# Patient Record
Sex: Female | Born: 1985 | Race: Black or African American | Hispanic: No | Marital: Single | Smoking: Never smoker
Health system: Southern US, Community
[De-identification: ages and names within clinical notes are randomized; demographics above are authoritative.]

## PROBLEM LIST (undated history)

## (undated) DIAGNOSIS — I10 Essential (primary) hypertension: Secondary | ICD-10-CM

## (undated) DIAGNOSIS — I639 Cerebral infarction, unspecified: Secondary | ICD-10-CM

## (undated) HISTORY — DX: Cerebral infarction, unspecified: I63.9

## (undated) HISTORY — DX: Essential (primary) hypertension: I10

---
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
## 2003-01-24 ENCOUNTER — Encounter: Admission: RE | Admit: 2003-01-24 | Discharge: 2003-01-24 | Payer: Self-pay | Admitting: Family Medicine

## 2003-03-11 ENCOUNTER — Emergency Department (HOSPITAL_COMMUNITY): Admission: EM | Admit: 2003-03-11 | Discharge: 2003-03-12 | Payer: Self-pay | Admitting: Emergency Medicine

## 2003-03-25 ENCOUNTER — Emergency Department (HOSPITAL_COMMUNITY): Admission: EM | Admit: 2003-03-25 | Discharge: 2003-03-25 | Payer: Self-pay | Admitting: Emergency Medicine

## 2008-07-30 DIAGNOSIS — Z8673 Personal history of transient ischemic attack (TIA), and cerebral infarction without residual deficits: Secondary | ICD-10-CM | POA: Insufficient documentation

## 2008-09-23 DIAGNOSIS — D6859 Other primary thrombophilia: Secondary | ICD-10-CM | POA: Insufficient documentation

## 2008-10-22 DIAGNOSIS — N939 Abnormal uterine and vaginal bleeding, unspecified: Secondary | ICD-10-CM | POA: Insufficient documentation

## 2008-10-23 DIAGNOSIS — D62 Acute posthemorrhagic anemia: Secondary | ICD-10-CM | POA: Insufficient documentation

## 2009-01-29 DIAGNOSIS — D6859 Other primary thrombophilia: Secondary | ICD-10-CM | POA: Insufficient documentation

## 2009-01-29 DIAGNOSIS — G8191 Hemiplegia, unspecified affecting right dominant side: Secondary | ICD-10-CM | POA: Insufficient documentation

## 2009-03-11 DIAGNOSIS — N179 Acute kidney failure, unspecified: Secondary | ICD-10-CM | POA: Insufficient documentation

## 2009-03-12 DIAGNOSIS — E870 Hyperosmolality and hypernatremia: Secondary | ICD-10-CM | POA: Insufficient documentation

## 2009-03-13 DIAGNOSIS — E876 Hypokalemia: Secondary | ICD-10-CM | POA: Insufficient documentation

## 2009-03-28 DIAGNOSIS — D649 Anemia, unspecified: Secondary | ICD-10-CM | POA: Insufficient documentation

## 2009-04-06 DIAGNOSIS — G890 Central pain syndrome: Secondary | ICD-10-CM | POA: Insufficient documentation

## 2010-08-25 DIAGNOSIS — IMO0001 Reserved for inherently not codable concepts without codable children: Secondary | ICD-10-CM | POA: Insufficient documentation

## 2010-09-17 ENCOUNTER — Inpatient Hospital Stay: Payer: Self-pay | Admitting: Obstetrics and Gynecology

## 2010-09-23 ENCOUNTER — Emergency Department: Payer: Self-pay | Admitting: Emergency Medicine

## 2010-11-28 ENCOUNTER — Inpatient Hospital Stay: Payer: Self-pay | Admitting: Internal Medicine

## 2011-06-16 ENCOUNTER — Emergency Department: Payer: Self-pay | Admitting: Unknown Physician Specialty

## 2011-06-16 LAB — URINALYSIS, COMPLETE
Bilirubin,UR: NEGATIVE
Blood: NEGATIVE
Glucose,UR: NEGATIVE mg/dL (ref 0–75)
Ketone: NEGATIVE
Leukocyte Esterase: NEGATIVE
Nitrite: NEGATIVE
Ph: 6 (ref 4.5–8.0)
Protein: 30
RBC,UR: <1 /HPF (ref 0–5)
Specific Gravity: 1.006 (ref 1.003–1.030)
Squamous Epithelial: NONE SEEN
WBC UR: 1 /HPF (ref 0–5)

## 2011-06-16 LAB — COMPREHENSIVE METABOLIC PANEL
Alkaline Phosphatase: 165 U/L — ABNORMAL HIGH (ref 50–136)
Anion Gap: 8 (ref 7–16)
BUN: 22 mg/dL — ABNORMAL HIGH (ref 7–18)
Bilirubin,Total: 0.1 mg/dL — ABNORMAL LOW (ref 0.2–1.0)
Chloride: 111 mmol/L — ABNORMAL HIGH (ref 98–107)
Co2: 17 mmol/L — ABNORMAL LOW (ref 21–32)
Creatinine: 1.04 mg/dL (ref 0.60–1.30)
EGFR (African American): >60
Osmolality: 274 (ref 275–301)
Potassium: 4.5 mmol/L (ref 3.5–5.1)
SGOT(AST): 24 U/L (ref 15–37)
SGPT (ALT): 20 U/L
Sodium: 136 mmol/L (ref 136–145)

## 2011-06-16 LAB — CBC
HCT: 37.7 % (ref 35.0–47.0)
HGB: 11.6 g/dL — ABNORMAL LOW (ref 12.0–16.0)
MCH: 27.1 pg (ref 26.0–34.0)
MCHC: 30.8 g/dL — ABNORMAL LOW (ref 32.0–36.0)
MCV: 88 fL (ref 80–100)
Platelet: 253 10*3/uL (ref 150–440)
RBC: 4.27 10*6/uL (ref 3.80–5.20)

## 2011-06-16 LAB — LIPASE, BLOOD: Lipase: 114 U/L (ref 73–393)

## 2011-06-17 LAB — HCG, QUANTITATIVE, PREGNANCY: Beta Hcg, Quant.: <1 m[IU]/mL — ABNORMAL LOW

## 2011-10-07 ENCOUNTER — Emergency Department: Payer: Self-pay | Admitting: Unknown Physician Specialty

## 2011-10-07 LAB — URINALYSIS, COMPLETE
Bacteria: NONE SEEN
Bilirubin,UR: NEGATIVE
Glucose,UR: NEGATIVE mg/dL (ref 0–75)
Ketone: NEGATIVE
Leukocyte Esterase: NEGATIVE
Nitrite: NEGATIVE
Ph: 6 (ref 4.5–8.0)
Protein: 100
RBC,UR: 1731 /HPF (ref 0–5)
Specific Gravity: 1.009 (ref 1.003–1.030)
Squamous Epithelial: 2
WBC UR: 9 /HPF (ref 0–5)

## 2011-10-07 LAB — CBC
HCT: 42.9 % (ref 35.0–47.0)
HGB: 14.1 g/dL (ref 12.0–16.0)
MCH: 29.2 pg (ref 26.0–34.0)
MCHC: 33 g/dL (ref 32.0–36.0)
MCV: 88 fL (ref 80–100)
Platelet: 283 10*3/uL (ref 150–440)
RBC: 4.85 10*6/uL (ref 3.80–5.20)
RDW: 14.7 % — ABNORMAL HIGH (ref 11.5–14.5)
WBC: 7.6 10*3/uL (ref 3.6–11.0)

## 2011-10-07 LAB — BASIC METABOLIC PANEL
Anion Gap: 10 (ref 7–16)
BUN: 12 mg/dL (ref 7–18)
Calcium, Total: 8.6 mg/dL (ref 8.5–10.1)
Chloride: 110 mmol/L — ABNORMAL HIGH (ref 98–107)
Co2: 19 mmol/L — ABNORMAL LOW (ref 21–32)
Creatinine: 0.79 mg/dL (ref 0.60–1.30)
EGFR (African American): >60
EGFR (Non-African Amer.): >60
Glucose: 87 mg/dL (ref 65–99)
Osmolality: 277 (ref 275–301)
Potassium: 4.3 mmol/L (ref 3.5–5.1)
Sodium: 139 mmol/L (ref 136–145)

## 2011-10-07 LAB — PREGNANCY, URINE: Pregnancy Test, Urine: NEGATIVE m[IU]/mL

## 2011-11-10 ENCOUNTER — Emergency Department: Payer: Self-pay | Admitting: Emergency Medicine

## 2011-11-10 LAB — BASIC METABOLIC PANEL
Anion Gap: 9 (ref 7–16)
BUN: 12 mg/dL (ref 7–18)
Calcium, Total: 8.5 mg/dL (ref 8.5–10.1)
Co2: 23 mmol/L (ref 21–32)
Creatinine: 0.79 mg/dL (ref 0.60–1.30)
EGFR (African American): >60
EGFR (Non-African Amer.): >60
Glucose: 76 mg/dL (ref 65–99)
Osmolality: 283 (ref 275–301)
Potassium: 4.4 mmol/L (ref 3.5–5.1)
Sodium: 143 mmol/L (ref 136–145)

## 2011-11-10 LAB — URINALYSIS, COMPLETE
Bacteria: NONE SEEN
Bilirubin,UR: NEGATIVE
Blood: NEGATIVE
Glucose,UR: NEGATIVE mg/dL (ref 0–75)
Hyaline Cast: 1
Ketone: NEGATIVE
Leukocyte Esterase: NEGATIVE
Nitrite: NEGATIVE
Ph: 5 (ref 4.5–8.0)
RBC,UR: 1 /HPF (ref 0–5)
Specific Gravity: 1.01 (ref 1.003–1.030)
Squamous Epithelial: NONE SEEN
WBC UR: 1 /HPF (ref 0–5)

## 2011-11-10 LAB — CBC
HGB: 11.3 g/dL — ABNORMAL LOW (ref 12.0–16.0)
MCH: 29.6 pg (ref 26.0–34.0)
MCV: 89 fL (ref 80–100)
RBC: 3.81 10*6/uL (ref 3.80–5.20)
WBC: 6.9 10*3/uL (ref 3.6–11.0)

## 2012-08-22 DIAGNOSIS — L309 Dermatitis, unspecified: Secondary | ICD-10-CM | POA: Insufficient documentation

## 2013-06-20 ENCOUNTER — Emergency Department: Payer: Self-pay | Admitting: Emergency Medicine

## 2013-08-14 ENCOUNTER — Ambulatory Visit (INDEPENDENT_AMBULATORY_CARE_PROVIDER_SITE_OTHER): Payer: Medicaid Other | Admitting: Podiatry

## 2013-08-14 ENCOUNTER — Encounter: Payer: Self-pay | Admitting: Podiatry

## 2013-08-14 VITALS — BP 101/70 | HR 64 | Resp 12

## 2013-08-14 DIAGNOSIS — M79673 Pain in unspecified foot: Secondary | ICD-10-CM

## 2013-08-14 DIAGNOSIS — B351 Tinea unguium: Secondary | ICD-10-CM

## 2013-08-14 DIAGNOSIS — Q828 Other specified congenital malformations of skin: Secondary | ICD-10-CM

## 2013-08-14 DIAGNOSIS — M79609 Pain in unspecified limb: Secondary | ICD-10-CM

## 2013-08-14 NOTE — Progress Notes (Signed)
   Subjective:    Patient ID: Stacey JacksonLatasha R [name], female    DOB: [birth year], 28 y.o.   MRN: [id number]  HPI  N-THICK, DISCOLORATION, SORE L-B/L TOENAILS D-LONG TERM O-SLOWLY C-WORSE A-PRESSURE T-NONE    Review of Systems  Musculoskeletal: Positive for gait problem.  All other systems reviewed and are negative.      Objective:   Physical Exam  Black female in orientated x3  Vascular: DP and PT pulses 2/4 bilaterally Sensation to 10 g monofilament wire intact 5/5 bilaterally  Dermatological: The toenails are elongated, brittle, discolored 6-10 Nucleated plantar keratoses x3 left Keratoses right and left hallux  Musculoskeletal: Patient has unstable gait, steppage gait right (toe heel) Manual motor testing Plantar flexion 5/5 right and 5/5 left Dorsi flexion 0/5 right 5/5 left Inversion 0/5 right and 5/5 left Eversion 0/5 right and 5/5 left No deformities noted     Assessment & Plan:   Assessment: Gait disturbance right post CVA Onychomycoses symptomatic 6-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

## 2013-08-15 ENCOUNTER — Encounter: Payer: Self-pay | Admitting: Podiatry

## 2013-08-28 ENCOUNTER — Ambulatory Visit: Payer: Self-pay | Admitting: Podiatrist

## 2013-11-18 DIAGNOSIS — R32 Unspecified urinary incontinence: Secondary | ICD-10-CM | POA: Insufficient documentation

## 2013-11-25 ENCOUNTER — Ambulatory Visit (INDEPENDENT_AMBULATORY_CARE_PROVIDER_SITE_OTHER): Payer: Medicaid Other | Admitting: Podiatry

## 2013-11-25 ENCOUNTER — Encounter: Payer: Self-pay | Admitting: Podiatry

## 2013-11-25 ENCOUNTER — Ambulatory Visit: Payer: Medicaid Other | Admitting: Podiatry

## 2013-11-25 DIAGNOSIS — B351 Tinea unguium: Secondary | ICD-10-CM

## 2013-11-25 DIAGNOSIS — M79676 Pain in unspecified toe(s): Secondary | ICD-10-CM

## 2013-11-25 DIAGNOSIS — Q828 Other specified congenital malformations of skin: Secondary | ICD-10-CM

## 2013-11-26 NOTE — Progress Notes (Signed)
Patient ID: Stacey JacksonLatasha R [name], female   DOB: March 15, 1985, 28 y.o.   MRN: [id number]  Subjective: This patient presents complaining of painful toenails and nucleated keratoses. Patient has a history of stroke resulting in gait disturbance. Patient is sneezing and coughing continuously today. Her mother is present in the room.  Objective: The toenails are elongated, hypertrophic, brittle 6-10 Nucleated plantar keratoses 3 left Non-nucleated keratoses hallux bilaterally  Assessment: Symptomatic onychomycoses 6-10 Porokeratosis 3 Keratoses 2 Gait disturbance post CVA  Plan: Nails 10 and keratoses 5 debrided without a bleeding  Reappoint 3 months

## 2014-02-24 ENCOUNTER — Ambulatory Visit (INDEPENDENT_AMBULATORY_CARE_PROVIDER_SITE_OTHER): Payer: Medicaid Other | Admitting: Podiatry

## 2014-02-24 ENCOUNTER — Encounter: Payer: Self-pay | Admitting: Podiatry

## 2014-02-24 DIAGNOSIS — M79676 Pain in unspecified toe(s): Secondary | ICD-10-CM

## 2014-02-24 DIAGNOSIS — B351 Tinea unguium: Secondary | ICD-10-CM

## 2014-02-24 DIAGNOSIS — Q828 Other specified congenital malformations of skin: Secondary | ICD-10-CM

## 2014-02-25 NOTE — Progress Notes (Signed)
Patient ID: Stacey JacksonLatasha R Gassett, female   DOB: 1985/12/25, 29 y.o.   MRN: [id number]  Subjective: This patient presents with mother present room complaining of painful toenails and painful keratoses.  Objective: The toenails are hypertrophic, elongated, discolored, brittle and tender to palpation 6-10 Nucleated plantar keratoses 3 left 1 right  Assessment: Symptomatic onychomycoses 6-10 Porokeratosis 3 Gait disturbance associated post CVA  Plan: Nails 10 and keratoses 3 debrided without a bleeding  Because patient is complaining of increased pain between 12 weeks visits reduced to 10 weeks

## 2014-05-12 ENCOUNTER — Encounter: Payer: Self-pay | Admitting: Podiatry

## 2014-05-12 ENCOUNTER — Ambulatory Visit (INDEPENDENT_AMBULATORY_CARE_PROVIDER_SITE_OTHER): Payer: Medicaid Other | Admitting: Podiatry

## 2014-05-12 DIAGNOSIS — B351 Tinea unguium: Secondary | ICD-10-CM

## 2014-05-12 DIAGNOSIS — Q828 Other specified congenital malformations of skin: Secondary | ICD-10-CM | POA: Diagnosis not present

## 2014-05-12 DIAGNOSIS — M79676 Pain in unspecified toe(s): Secondary | ICD-10-CM | POA: Diagnosis not present

## 2014-05-13 NOTE — Progress Notes (Signed)
Patient ID: Stacey [name], female   DOB: [birth year], 29 y.o.   MRN: [id number]  Subjective: This patient presents complaining of painful toenails and keratoses. Her mother is present in the treatment room today  Objective: The toenails are elongated, hypertrophic, discolored, incurvated and tender to direct palpation 6-10 Nucleated plantar keratoses 3 left 1 right  Assessment: Symptomatic onychomycoses 6-10 Porokeratosis 3 Gait disturbance associated post CVA  Plan: Debrided nails 10 and keratoses 3 without any bleeding  Reappoint 10 weeks

## 2014-07-21 ENCOUNTER — Ambulatory Visit: Payer: Medicaid Other | Admitting: Podiatry

## 2014-07-22 ENCOUNTER — Ambulatory Visit (INDEPENDENT_AMBULATORY_CARE_PROVIDER_SITE_OTHER): Payer: Medicaid Other | Admitting: Podiatry

## 2014-07-22 ENCOUNTER — Encounter: Payer: Self-pay | Admitting: Podiatry

## 2014-07-22 DIAGNOSIS — M79676 Pain in unspecified toe(s): Secondary | ICD-10-CM | POA: Diagnosis not present

## 2014-07-22 DIAGNOSIS — B351 Tinea unguium: Secondary | ICD-10-CM

## 2014-07-22 DIAGNOSIS — Q828 Other specified congenital malformations of skin: Secondary | ICD-10-CM

## 2014-07-22 NOTE — Progress Notes (Signed)
Patient ID: Stacey JacksonLatasha R [name], female   DOB: 03-01-1985, 29 y.o.   MRN: [id number]  Subjective: This patient presents today complaining of painful toenails and keratoses and is requesting debridement. Patient is presently treatment room  Objective: The toenails are hypertrophic, elongated, discolored, incurvated and tender direct palpation 6-10 Keratoses hallux bilaterally and plantar second MPJ 3  Assessment: Gait disturbance post CVA Symptomatic onychomycoses 6-10 Porokeratosis 1  Plan: Debridement of toenails mechanically and electrically without a bleeding Debrided keratoses 3 without any bleeding  Reappoint 3 months

## 2014-10-09 DIAGNOSIS — N182 Chronic kidney disease, stage 2 (mild): Secondary | ICD-10-CM | POA: Insufficient documentation

## 2014-10-22 ENCOUNTER — Ambulatory Visit: Payer: Medicaid Other | Admitting: Podiatry

## 2014-10-24 DIAGNOSIS — R7303 Prediabetes: Secondary | ICD-10-CM | POA: Insufficient documentation

## 2014-12-23 DIAGNOSIS — B351 Tinea unguium: Secondary | ICD-10-CM

## 2014-12-23 DIAGNOSIS — M79676 Pain in unspecified toe(s): Secondary | ICD-10-CM

## 2014-12-23 DIAGNOSIS — Q828 Other specified congenital malformations of skin: Secondary | ICD-10-CM

## 2014-12-24 ENCOUNTER — Ambulatory Visit (INDEPENDENT_AMBULATORY_CARE_PROVIDER_SITE_OTHER): Payer: Medicaid Other | Admitting: Podiatry

## 2014-12-24 ENCOUNTER — Encounter: Payer: Self-pay | Admitting: Podiatry

## 2014-12-24 DIAGNOSIS — B351 Tinea unguium: Secondary | ICD-10-CM

## 2014-12-24 DIAGNOSIS — M79676 Pain in unspecified toe(s): Secondary | ICD-10-CM

## 2014-12-24 DIAGNOSIS — Q828 Other specified congenital malformations of skin: Secondary | ICD-10-CM

## 2014-12-24 NOTE — Progress Notes (Signed)
Patient ID: Stacey JacksonLatasha R [name], female   DOB: 12/05/1985, 29 y.o.   MRN: [id number]  Subjective: This patient presents for scheduled visit complaining of elongated and thickened and deformed toenails which are uncomfortable with walking and drainage shoe pressure requesting debridement. Also patient complaining of a painful callus on the plantar aspect left foot. Patient presents at approximately three-month intervals for skin a nail debridement  Objective: Orientated 3 patient presents with mother treatment room The toenails are elongated, hypertrophic, incurvated, deformed and tender direct palpation 6-10 Minimal keratoses hallux bilaterally Nucleated keratoses plantar subsecond MPJ left  Assessment: Symptomatic onychomycoses 6-10 Porokeratosis 1 Gait disturbance post CVA  Plan: Debridement toenails 6-10 mechanical and electrical without any bleeding Debrided porokeratosis 1 without any bleeding  Reappoint 3 months

## 2015-03-31 ENCOUNTER — Ambulatory Visit (INDEPENDENT_AMBULATORY_CARE_PROVIDER_SITE_OTHER): Payer: Medicaid Other | Admitting: Podiatry

## 2015-03-31 ENCOUNTER — Encounter: Payer: Self-pay | Admitting: Podiatry

## 2015-03-31 DIAGNOSIS — B351 Tinea unguium: Secondary | ICD-10-CM | POA: Diagnosis not present

## 2015-03-31 DIAGNOSIS — Q828 Other specified congenital malformations of skin: Secondary | ICD-10-CM

## 2015-03-31 DIAGNOSIS — M79676 Pain in unspecified toe(s): Secondary | ICD-10-CM

## 2015-03-31 NOTE — Progress Notes (Signed)
Patient ID: Stacey JacksonLatasha R [name], female   DOB: Jan 20, 1985, 30 y.o.   MRN: [id number]   Subjective: This patient presents for scheduled visit complaining of elongated and thickened and deformed toenails which are uncomfortable with walking and drainage shoe pressure requesting debridement. Also patient complaining of a painful callus on the plantar aspect left foot. Patient presents at approximately three-month intervals for skin a nail debridement Patient's mother present in the treatment room today  Objective: Orientated 3 patient presents with mother treatment room The toenails are elongated, hypertrophic, incurvated, deformed and tender direct palpation 6-10 Minimal keratoses hallux bilaterally Nucleated keratoses plantar subsecond MPJ left  Assessment: Symptomatic onychomycoses 6-10 Porokeratosis 1 Gait disturbance post CVA  Plan: Debridement toenails 6-10 mechanical and electrically without any bleeding Debrided porokeratosis 1 without a bleeding Please note patient was extremely anxious and withdrawing right lower extremity making debridement more difficult today

## 2015-06-30 ENCOUNTER — Encounter: Payer: Self-pay | Admitting: Podiatry

## 2015-06-30 ENCOUNTER — Ambulatory Visit (INDEPENDENT_AMBULATORY_CARE_PROVIDER_SITE_OTHER): Payer: Medicaid Other | Admitting: Podiatry

## 2015-06-30 DIAGNOSIS — Q828 Other specified congenital malformations of skin: Secondary | ICD-10-CM | POA: Diagnosis not present

## 2015-06-30 DIAGNOSIS — M79676 Pain in unspecified toe(s): Secondary | ICD-10-CM

## 2015-06-30 DIAGNOSIS — B351 Tinea unguium: Secondary | ICD-10-CM

## 2015-06-30 NOTE — Progress Notes (Signed)
Patient ID: Stacey JacksonLatasha R [name], female   DOB: 11/12/1985, [age] y.o.   MRN: [id number]   Subjective: This patient presents for scheduled visit complaining of elongated and thickened and deformed toenails which are uncomfortable with walking and drainage shoe pressure requesting debridement. Also patient complaining of a painful callus on the plantar aspect left foot. Patient presents at approximately three-month intervals for skin a nail debridement Patient's mother present in the treatment room today  Objective: Orientated 3 patient presents with mother treatment room The toenails are elongated, hypertrophic, incurvated, deformed and tender direct palpation 6-10 Nucleated keratoses plantar subsecond MPJ left and hallux bilaterally  Assessment: Symptomatic onychomycoses 6-10 Porokeratosis 3 Gait disturbance post CVA  Plan: Debridement toenails 6-10 mechanical and electrically without any bleeding Debrided porokeratosis 3 without any  bleeding

## 2015-10-07 ENCOUNTER — Encounter: Payer: Self-pay | Admitting: Podiatry

## 2015-10-07 ENCOUNTER — Ambulatory Visit (INDEPENDENT_AMBULATORY_CARE_PROVIDER_SITE_OTHER): Payer: Medicaid Other | Admitting: Podiatry

## 2015-10-07 VITALS — BP 106/59 | HR 89 | Resp 18

## 2015-10-07 DIAGNOSIS — B351 Tinea unguium: Secondary | ICD-10-CM | POA: Diagnosis not present

## 2015-10-07 DIAGNOSIS — Q828 Other specified congenital malformations of skin: Secondary | ICD-10-CM | POA: Diagnosis not present

## 2015-10-07 DIAGNOSIS — M79676 Pain in unspecified toe(s): Secondary | ICD-10-CM | POA: Diagnosis not present

## 2015-10-08 NOTE — Progress Notes (Signed)
Patient ID: Stacey JacksonLatasha R [name], female   DOB: 1985/09/29, 30 y.o.   MRN: [id number]    Subjective: This patient presents for scheduled visit complaining of elongated and thickened and deformed toenails which are uncomfortable with walking and drainage shoe pressure requesting debridement. Also patient complaining of a painful callus on the plantar aspect left foot. Patient presents at approximately three-month intervals for skin a nail debridement Patient's mother present in the treatment room today  Objective: Orientated 3 patient presents with mother treatment room The toenails are elongated, hypertrophic, incurvated, deformed and tender direct palpation 6-10 Nucleated keratoses plantar subsecond MPJ left and hallux bilaterally  Assessment: Symptomatic onychomycoses 6-10 Porokeratosis 3 Gait disturbance post CVA  Plan: Debridement toenails 6-10 mechanical and electrically without any bleeding Debrided porokeratosis 3 without any  bleeding  Reappoint 3 months

## 2016-01-06 ENCOUNTER — Ambulatory Visit (INDEPENDENT_AMBULATORY_CARE_PROVIDER_SITE_OTHER): Payer: Medicaid Other | Admitting: Podiatry

## 2016-01-06 ENCOUNTER — Encounter: Payer: Self-pay | Admitting: Podiatry

## 2016-01-06 VITALS — BP 126/83 | HR 55 | Resp 18

## 2016-01-06 DIAGNOSIS — B351 Tinea unguium: Secondary | ICD-10-CM

## 2016-01-06 DIAGNOSIS — M79676 Pain in unspecified toe(s): Secondary | ICD-10-CM

## 2016-01-06 DIAGNOSIS — Q828 Other specified congenital malformations of skin: Secondary | ICD-10-CM

## 2016-01-07 NOTE — Progress Notes (Signed)
Patient ID: Stacey JacksonLatasha R [name], female   DOB: 24-Aug-1985, 31 y.o.   MRN: [id number]    Subjective: This patient presents for scheduled visit complaining of elongated and thickened and deformed toenails which are uncomfortable with walking and drainage shoe pressure requesting debridement. Also patient complaining of a painful callus on the plantar aspect left foot. Patient presents at approximately three-month intervals for skin a nail debridement Patient's mother present in the treatment room today  Objective: Orientated 3 patient presents with mother treatment room DP and PT pulses 2/4 bilaterally Capillary reflex immediate bilaterally Sensation to 10 g monofilament wire intact 0/5 right and 5/5 left Vibratory sensation nonreactive right and reactive left Dorsi flexion right 0/5 and 5/5 left Plantar flexion 4/5 right 5/5 left The toenails are elongated, hypertrophic, incurvated, deformed and tender direct palpation 6-10 Nucleated keratoses plantar subsecond MPJ left and hallux bilaterally  Assessment: Symptomatic onychomycoses 6-10 Porokeratosis 3 Gait disturbance post CVA  Plan: Debridement toenails 6-10 mechanical and electrically without any bleeding Debrided porokeratosis 3 without any bleeding  Reappoint 3 months

## 2016-04-06 ENCOUNTER — Ambulatory Visit: Payer: Medicaid Other | Admitting: Podiatry

## 2016-05-18 ENCOUNTER — Encounter: Payer: Self-pay | Admitting: Podiatry

## 2016-05-18 ENCOUNTER — Ambulatory Visit (INDEPENDENT_AMBULATORY_CARE_PROVIDER_SITE_OTHER): Payer: Medicaid Other | Admitting: Podiatry

## 2016-05-18 DIAGNOSIS — Q828 Other specified congenital malformations of skin: Secondary | ICD-10-CM

## 2016-05-18 DIAGNOSIS — M79676 Pain in unspecified toe(s): Secondary | ICD-10-CM | POA: Diagnosis not present

## 2016-05-18 DIAGNOSIS — B351 Tinea unguium: Secondary | ICD-10-CM | POA: Diagnosis not present

## 2016-05-18 NOTE — Progress Notes (Signed)
Patient ID: Stacey JacksonLatasha R [name], female   DOB: June 15, 1985, 31 y.o.   MRN: [id number]   Subjective: This patient presents for scheduled visit complaining of elongated and thickened and deformed toenails which are uncomfortable with walking and drainage shoe pressure requesting debridement. Also patient complaining of a painful callus on the plantar aspect left foot. Patient presents at approximately three-month intervals for skin a nail debridement Patient's mother present in the treatment room today  Objective: Orientated 3 patient presents with mother treatment room DP and PT pulses 2/4 bilaterally Capillary reflex immediate bilaterally Sensation to 10 g monofilament wire intact 0/5 right and 5/5 left Vibratory sensation nonreactive right and reactive left Dorsi flexion right 0/5 and 5/5 left Plantar flexion 4/5 right 5/5 left The toenails are elongated, hypertrophic, incurvated, deformed and tender direct palpation 6-10 Nucleated keratoses plantar subsecond MPJ left and hallux bilaterally  Assessment: Symptomatic onychomycoses 6-10 Porokeratosis 3 Gait disturbance post CVA  Plan: Debridement toenails 6-10 mechanical and electrically without any bleeding Debrided porokeratosis 3 without any bleeding  Reappoint 33 months

## 2016-08-31 ENCOUNTER — Ambulatory Visit: Payer: Medicaid Other | Admitting: Physical Therapy

## 2016-09-12 ENCOUNTER — Ambulatory Visit: Payer: Medicaid Other | Attending: Family Medicine | Admitting: Physical Therapy

## 2016-09-12 ENCOUNTER — Encounter: Payer: Self-pay | Admitting: Physical Therapy

## 2016-09-12 DIAGNOSIS — M25611 Stiffness of right shoulder, not elsewhere classified: Secondary | ICD-10-CM

## 2016-09-12 DIAGNOSIS — M25511 Pain in right shoulder: Secondary | ICD-10-CM

## 2016-09-12 NOTE — Therapy (Signed)
[facility] [address] [place], [place], [address] Phone: [phone]   Fax:  [phone]  Physical Therapy Evaluation  Patient Details  Name: Stacey [name] MRN: [id number] Date of Birth: 03/24/1985 Referring Provider: Fabienne Bruns  Encounter Date: 09/12/2016      PT End of Session - 09/12/16 1755    Visit Number 1   Authorization Type Medicaid which only allows one visit per year   PT Start Time 1650   PT Stop Time 1745   PT Time Calculation (min) 55 min   Activity Tolerance Patient tolerated treatment well;Patient limited by pain   Behavior During Therapy Anxious      Past Medical History:  Diagnosis Date  . Hypertension   . Stroke [facility])     History reviewed. No pertinent surgical history.  There were no vitals filed for this visit.       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  She c/o right shoulder pain.     Pertinent History History of stroke with right hemiparesis   Patient Stated Goals have less pain, less stiffness   Currently in Pain? Yes   Pain Score 0-No pain   Pain Location Shoulder   Pain Orientation Anterior;Right   Pain Descriptors / Indicators Aching;Spasm;Tightness   Pain Type Chronic pain   Pain Onset More than a month ago   Pain Frequency Intermittent   Aggravating Factors  only with use, washing, dressing deodorant pain rated a 8-9/10   Pain Relieving Factors rest no pain   Effect of Pain on Daily Activities difficulty with ADL's            [facility] PT Assessment - 09/12/16 0001      Assessment   Medical Diagnosis right pectoralis strain, frozen right shoulder   Referring Provider Fabienne Bruns   Onset Date/Surgical Date 08/12/16   Hand Dominance Right   Prior Therapy after the stroke years ago     Precautions   Precautions Fall     Balance Screen   Has the patient fallen in the past 6 months Yes   How many times? 3   Has the patient had a decrease in activity level because of a fear of falling?  Yes   Is the patient reluctant to leave their home because of a fear of falling?  No     Home Environment   Additional Comments lives with mom, needs help with dressing and doing hair     Prior Function   Level of Independence Needs assistance with ADLs;Needs assistance with gait;Requires assistive device for independence;Needs assistance with transfers   Vocation On disability   Vocation Requirements Stroke 2012   Leisure goes to a day program 4 hours a day     Posture/Postural Control   Posture Comments fwd head, rounded shoulders     Tone   Assessment Location Right Upper Extremity     ROM / Strength   AROM / PROM / Strength PROM     PROM   Overall PROM Comments PROM of the right elbow was to 45 degrees from straight   PROM Assessment Site Shoulder   Right/Left Shoulder Right   Right Shoulder Flexion 75 Degrees   Right Shoulder ABduction 70 Degrees   Right Shoulder Internal Rotation  10 Degrees   Right Shoulder External Rotation 30 Degrees     Palpation   Palpation comment she is very sore and tight in the right upper trap, the pec and the biceps     RUE Tone   RUE Tone Hypertonic            Objective measurements completed on examination: See above findings.          [facility] Adult PT Treatment/Exercise - 09/12/16 0001      Ambulation/Gait   Gait Comments gait with SBQC in left hand, small steps, slow, shuffles, right arm drawn up     Exercises   Exercises Shoulder     Shoulder Exercises: ROM/Strengthening   UBE (Upper Arm Bike) level 2 x 3 minutes with right hand tied on to handle   Other ROM/Strengthening Exercises Nustep with right hand and foot tied onto plate and handle x 3 minutes                PT Education - 09/12/16 1755     Education provided Yes   Education Details HEP for AAROM of the right shoulder and elbow using the left arm   Person(s) Educated Patient;Parent(s)   Methods Explanation;Demonstration;Handout   Comprehension Verbalized understanding;Returned demonstration;Tactile cues required             PT Long Term Goals - 09/12/16 1804      PT LONG TERM GOAL #1   Title patient and or mom is independent with HEP/PROM   Time 4   Period Weeks   Status New                Plan - 09/12/16 1756    Clinical Impression Statement Patient iwth right shoulder pain and stiffness.  Had a stroke 6 years ago.  Has not had PT since that time and has not done any exercises, she is very spastic in the right UE, she is fearful of hurting and gaurded with any PROM.  HEr ROM is limited to 70 degrees flexion passively, her right elbow extension is limited to 45 degrees from straight.  She truly needs guidance and help with this, however she has Medicaid and they only allow one visit per year.   History and Personal Factors relevant to plan of care: Stroke with hemiparesis of the right UE and right LE, she has not had PT in 6 years, she has severely limited motion and c/o pain, she is fearful   Clinical Presentation Stable   Clinical Decision Making Moderate   Rehab Potential Poor   PT Frequency 1x / week   PT Duration 2 weeks   PT Treatment/Interventions Therapeutic exercise;Therapeutic activities;Manual techniques   PT Next Visit Plan I gave HEP for her to try today, she is to do this at home, I asked them to come back in another week or two, at that time I really feel like we need to teach the mom how to do PROM as if it is not addressed she could develop further issues not just pain but significant hygeine isssues that may lead to open sores   Consulted and Agree with Plan of Care Patient      Patient will benefit from skilled therapeutic intervention in order to improve the following deficits and  impairments:  Impaired flexibility, Pain, Decreased range of motion, Increased muscle spasms, Impaired tone, Increased fascial restricitons  Visit Diagnosis: Acute pain of right shoulder - Plan: PT plan of care cert/re-cert  Stiffness of  right shoulder, not elsewhere classified - Plan: PT plan of care cert/re-cert     Problem List There are no active problems to display for this patient.   Jearld Lesch., PT 09/12/2016, 6:07 PM  [facility] [address] [place], [place], [address] Phone: [phone]   Fax:  [phone]  Name: AYLINN RYDBERG MRN: [id number] Date of Birth: Mar 24, 1985

## 2016-09-26 ENCOUNTER — Ambulatory Visit (INDEPENDENT_AMBULATORY_CARE_PROVIDER_SITE_OTHER): Payer: Medicaid Other | Admitting: Podiatry

## 2016-09-26 ENCOUNTER — Encounter: Payer: Self-pay | Admitting: Podiatry

## 2016-09-26 VITALS — BP 121/83 | HR 90

## 2016-09-26 DIAGNOSIS — M79676 Pain in unspecified toe(s): Secondary | ICD-10-CM | POA: Diagnosis not present

## 2016-09-26 DIAGNOSIS — B351 Tinea unguium: Secondary | ICD-10-CM

## 2016-09-26 DIAGNOSIS — Q828 Other specified congenital malformations of skin: Secondary | ICD-10-CM

## 2016-09-26 NOTE — Progress Notes (Signed)
Patient ID: Stacey [name], female   DOB: March 01, 1985, 31 y.o.   MRN: [id number]    Subjective: This patient presents for scheduled visit complaining of elongated and thickened and deformed toenails which are uncomfortable with walking and drainage shoe pressure requesting debridement. Also patient complaining of a painful callus on the plantar aspect left foot. Patient presents at approximately three-month intervals for skin a nail debridement Patient's mother present in the treatment room today  Objective: Orientated 3 patient presents with mother treatment room DP and PT pulses 2/4 bilaterally Capillary reflex immediate bilaterally Sensation to 10 g monofilament wire intact 0/5 right and 5/5 left Vibratory sensation nonreactive right and reactive left Dorsi flexion right 0/5 and 5/5 left Plantar flexion 4/5 right 5/5 left The toenails are elongated, hypertrophic, incurvated, deformed and tender direct palpation 6-10 Nucleated keratoses plantar subsecond MPJ left and hallux bilaterally  Assessment: Symptomatic onychomycoses 6-10 Porokeratosis 3 Gait disturbance post CVA  Plan: Debridement toenails 6-10 mechanical and electrically without any bleeding Debrided porokeratosis 3 without any bleeding  Reappoint 3 months

## 2016-09-27 ENCOUNTER — Ambulatory Visit: Payer: Medicaid Other | Admitting: Physical Therapy

## 2016-12-19 ENCOUNTER — Ambulatory Visit: Payer: Medicaid Other | Admitting: Podiatry

## 2017-01-20 ENCOUNTER — Ambulatory Visit: Payer: Medicaid Other | Admitting: Podiatry

## 2017-01-20 ENCOUNTER — Encounter: Payer: Self-pay | Admitting: Podiatry

## 2017-01-20 DIAGNOSIS — B351 Tinea unguium: Secondary | ICD-10-CM | POA: Diagnosis not present

## 2017-01-20 DIAGNOSIS — D689 Coagulation defect, unspecified: Secondary | ICD-10-CM

## 2017-01-20 DIAGNOSIS — Q828 Other specified congenital malformations of skin: Secondary | ICD-10-CM

## 2017-01-20 DIAGNOSIS — M79676 Pain in unspecified toe(s): Secondary | ICD-10-CM | POA: Diagnosis not present

## 2017-01-24 NOTE — Progress Notes (Signed)
Subjective:   Patient ID: Stacey [name], female   DOB: 32 y.o.   MRN: [id number]   HPI Patient presents with nail disease 1-5 both feet that are incurvated get sore make it hard to wear shoe gear.  Also has lesions on the hallux bilateral that are painful   ROS      Objective:  Physical Exam  Patient is a long-term unhealthy patient with blood clotting disorder who is on blood thinner with nail disease that are thick incurvated and painful along with lesion formation     Assessment:  Chronic mycotic nail infection with pain 1-5 both feet with lesions on the hallux bilateral with blood clotting disorder     Plan:  Reviewed condition debrided nailbeds lesions with no iatrogenic bleeding and reappoint for routine care

## 2017-04-24 ENCOUNTER — Ambulatory Visit (INDEPENDENT_AMBULATORY_CARE_PROVIDER_SITE_OTHER): Payer: Medicaid Other | Admitting: Podiatry

## 2017-04-24 ENCOUNTER — Encounter: Payer: Self-pay | Admitting: Podiatry

## 2017-04-24 DIAGNOSIS — F32A Depression, unspecified: Secondary | ICD-10-CM | POA: Insufficient documentation

## 2017-04-24 DIAGNOSIS — I639 Cerebral infarction, unspecified: Secondary | ICD-10-CM | POA: Insufficient documentation

## 2017-04-24 DIAGNOSIS — Q828 Other specified congenital malformations of skin: Secondary | ICD-10-CM

## 2017-04-24 DIAGNOSIS — M79676 Pain in unspecified toe(s): Secondary | ICD-10-CM

## 2017-04-24 DIAGNOSIS — G43909 Migraine, unspecified, not intractable, without status migrainosus: Secondary | ICD-10-CM | POA: Insufficient documentation

## 2017-04-24 DIAGNOSIS — D689 Coagulation defect, unspecified: Secondary | ICD-10-CM

## 2017-04-24 DIAGNOSIS — I10 Essential (primary) hypertension: Secondary | ICD-10-CM | POA: Insufficient documentation

## 2017-04-24 DIAGNOSIS — F329 Major depressive disorder, single episode, unspecified: Secondary | ICD-10-CM | POA: Insufficient documentation

## 2017-04-24 DIAGNOSIS — B351 Tinea unguium: Secondary | ICD-10-CM | POA: Diagnosis not present

## 2017-04-24 DIAGNOSIS — A6000 Herpesviral infection of urogenital system, unspecified: Secondary | ICD-10-CM | POA: Insufficient documentation

## 2017-04-24 DIAGNOSIS — E785 Hyperlipidemia, unspecified: Secondary | ICD-10-CM | POA: Insufficient documentation

## 2017-04-26 NOTE — Progress Notes (Signed)
Subjective:   Patient ID: Stacey JacksonLatasha R [name], female   DOB: 32 y.o.   MRN: [id number]   HPI Patient presents with a history of being on blood thinner and has nail disease that she cannot cut and lesions on both feet that are very thick and she cannot take care of and are painful   ROS      Objective:  Physical Exam  Neurovascular status unchanged with thick yellow brittle nailbeds that are painful 1-5 both feet and lesions bilateral that are painful with blood thinner that makes her condition at high risk     Assessment:  Mycotic nail infection with pain and severe lesion formation bilateral with at risk condition     Plan:  Debrided nailbeds 1-5 both feet with no iatrogenic bleeding and debrided lesions bilateral with no iatrogenic bleeding and reappoint for routine care

## 2017-07-24 ENCOUNTER — Ambulatory Visit: Payer: Medicaid Other | Admitting: Podiatry

## 2017-07-24 ENCOUNTER — Encounter: Payer: Self-pay | Admitting: Podiatry

## 2017-07-24 DIAGNOSIS — B351 Tinea unguium: Secondary | ICD-10-CM | POA: Diagnosis not present

## 2017-07-24 DIAGNOSIS — M79675 Pain in left toe(s): Secondary | ICD-10-CM | POA: Diagnosis not present

## 2017-07-24 DIAGNOSIS — M79674 Pain in right toe(s): Secondary | ICD-10-CM | POA: Diagnosis not present

## 2017-07-24 DIAGNOSIS — Q828 Other specified congenital malformations of skin: Secondary | ICD-10-CM | POA: Diagnosis not present

## 2017-07-24 DIAGNOSIS — D689 Coagulation defect, unspecified: Secondary | ICD-10-CM

## 2017-07-26 NOTE — Progress Notes (Signed)
Subjective:   Patient ID: Lorrin JacksonLatasha R [place]hite, female   DOB: 32 y.o.   MRN: [id number]   HPI Patient presents with thick yellow brittle nailbeds of both feet that she cannot take care of and lesions on both feet underneath the first metatarsal that are painful and she cannot cut she is on a blood thinner which creates high risk environment   ROS      Objective:  Physical Exam  Neurovascular status unchanged with patient found to have thick yellow brittle nailbeds 1-5 both feet that are painful and is noted to have lesion sub-first metatarsal that are painful when palpated     Assessment:  Chronic symptomatic mycotic nailbeds 1-5 both feet that she cannot cut and are painful and lesion formation bilateral that she cannot take care of     Plan:  H&P reviewed daily inspections of feet and debrided nailbeds 1-5 both feet with no iatrogenic bleeding lesions bilateral with no iatrogenic bleeding noted

## 2017-10-23 ENCOUNTER — Encounter: Payer: Self-pay | Admitting: Podiatry

## 2017-10-23 ENCOUNTER — Ambulatory Visit: Payer: Medicaid Other | Admitting: Podiatry

## 2017-10-23 DIAGNOSIS — L84 Corns and callosities: Secondary | ICD-10-CM

## 2017-10-23 DIAGNOSIS — B351 Tinea unguium: Secondary | ICD-10-CM | POA: Diagnosis not present

## 2017-10-23 DIAGNOSIS — M79675 Pain in left toe(s): Secondary | ICD-10-CM

## 2017-10-23 DIAGNOSIS — E1142 Type 2 diabetes mellitus with diabetic polyneuropathy: Secondary | ICD-10-CM

## 2017-10-23 DIAGNOSIS — M79674 Pain in right toe(s): Secondary | ICD-10-CM

## 2017-10-31 NOTE — Progress Notes (Signed)
Subjective: Stacey [name] is a 32 y.o. y.o. female who presents today for diabetic preventative foot care.  She is seen for painful mycotic toenails b/l feet and   cc of  painful calluses b/l.  Her mother is her primary caretaker and voices no new pedal concerns on today's visit. [name] voices no new complaints in regards to her feet.  She has h/o stroke and is taking Metformin for diabetes and is on clopidogrel as a blood thinner.  Objective: Vascular Examination: Capillary refill time immediate x 10 digits Dorsalis pedis pulses present b/l Posterior tibial pulses present b/l No digital hair x 10 digits Skin temperature warm to cool b/l  Dermatological Examination: Skin is noted to be supple and dry b/l Toenails 1-5 b/l discolored, thick, dystrophic with subungual debris and pain with palpation to nailbeds due to thickness of nails. Hyperkeratotic lesions noted submet head 5 left, submet head 2 left foot and plantarmedial hallux IPJ b/l. All lesions with no flocculence, no erythema, no edema, no warmth.  Musculoskeletal: Muscle strength 5/5 to all LE muscle groups left Muscle strength 3/5 to LE muscle groups right  Neurological: Sensation intact with 10 gram monofilament left foot; diminished right foot  Last A1C: 5.6 (10/10/2017) @[facility]  [place] On Plavix   Assessment: 1.  Painful onychomycosis toenails 1-5 b/l in patient on blood thinner.  2. Calluses x 4:  submet head 5 left, submet head 2 left foot and plantarmedial hallux IPJ b/l.  3. NIDDM with neuropathy  Plan: 1. Toenails 1-5 b/l were debrided in length and girth without iatrogenic bleeding. 2. Hyperkeratotic lesion debrided submet head 5 left, submet head 2 left foot and plantarmedial hallux IPJ b/l (total 4 lesions) utilizing sterile chisel blade. 3. Patient to continue soft, supportive shoe gear 4. Patient to report any pedal injuries to medical professional immediately. 5. Avoid self trimming due to use  of blood thinner. 6. Follow up 3 months. Patient/POA to call should there be a concern in the interim.

## 2017-11-02 ENCOUNTER — Ambulatory Visit: Payer: Medicaid Other | Attending: Family Medicine | Admitting: Physical Therapy

## 2017-11-02 DIAGNOSIS — M6281 Muscle weakness (generalized): Secondary | ICD-10-CM | POA: Diagnosis present

## 2017-11-02 DIAGNOSIS — R279 Unspecified lack of coordination: Secondary | ICD-10-CM | POA: Insufficient documentation

## 2017-11-02 NOTE — Patient Instructions (Signed)
About Abdominal Massage  Abdominal massage, also called external colon massage, is a self-treatment circular massage technique that can reduce and eliminate gas and ease constipation. The colon naturally contracts in waves in a clockwise direction starting from inside the right hip, moving up toward the ribs, across the belly, and down inside the left hip.  When you perform circular abdominal massage, you help stimulate your colon's normal wave pattern of movement called peristalsis.  It is most beneficial when done after eating.  Positioning You can practice abdominal massage with oil while lying down, or in the shower with soap.  Some people find that it is just as effective to do the massage through clothing while sitting or standing.  How to Massage Start by placing your finger tips or knuckles on your right side, just inside your hip bone.  . Make small circular movements while you move upward toward your rib cage.   . Once you reach the bottom right side of your rib cage, take your circular movements across to the left side of the bottom of your rib cage.  . Next, move downward until you reach the inside of your left hip bone.  This is the path your feces travel in your colon. . Continue to perform your abdominal massage in this pattern for 10 minutes each day.     You can apply as much pressure as is comfortable in your massage.  Start gently and build pressure as you continue to practice.  Notice any areas of pain as you massage; areas of slight pain may be relieved as you massage, but if you have areas of significant or intense pain, consult with your healthcare provider.  Other Considerations . General physical activity including bending and stretching can have a beneficial massage-like effect on the colon.  Deep breathing can also stimulate the colon because breathing deeply activates the same nervous system that supplies the colon.   . Abdominal massage should always be used in  combination with a bowel-conscious diet that is high in the proper type of fiber for you, fluids (primarily water), and a regular exercise program.  Types of Fiber  There are two main types of fiber:  insoluble and soluble.  Both of these types can prevent and relieve constipation and diarrhea, although some people find one or the other to be more easily digested.  This handout details information about both types of fiber.  Insoluble Fiber       Functions of Insoluble Fiber . moves bulk through the intestines  . controls and balances the pH (acidity) in the intestines       Benefits of Insoluble Fiber . promotes regular bowel movement and prevents constipation  . removes fecal waste through colon in less time  . keeps an optimal pH in intestines to prevent microbes from producing cancer substances, therefore preventing colon cancer        Food Sources of Insoluble Fiber . whole-wheat products  . wheat bran "miller's bran" . corn bran  . flax seed or other seeds . vegetables such as green beans, broccoli, cauliflower and potato skins  . fruit skins and root vegetable skins  . popcorn . brown rice  Soluble Fiber       Functions of Soluble Fiber  . holds water in the colon to bulk and soften the stool . prolongs stomach emptying time so that sugar is released and absorbed more slowly        Benefits of Soluble Fiber .   lowers total cholesterol and LDL cholesterol (the bad cholesterol) therefore reducing the risk of heart disease  . regulates blood sugar for people with diabetes       Food Sources of Soluble Fiber . oat/oat bran . dried beans and peas  . nuts  . barley  . flax seed or other seeds . fruits such as oranges, pears, peaches, and apples  . vegetables such as carrots  . psyllium husk  . prunes  

## 2017-11-03 NOTE — Therapy (Signed)
[facility] [address] [place], [place], [address] Phone: [phone]   Fax:  [phone]  Physical Therapy Evaluation  Patient Details  Name: Stacey [name] MRN: [id number] Date of Birth: 13-Jun-1985 Referring Provider (PT): Fabienne Bruns [place], [place]   Encounter Date: 11/02/2017  PT End of Session - 11/02/17 1742    Visit Number  1    Date for PT Re-Evaluation  01/25/18    Authorization Type  Medicaid     PT Start Time  1531    PT Stop Time  1623    PT Time Calculation (min)  52 min    Activity Tolerance  Patient tolerated treatment well;Patient limited by pain    Behavior During Therapy  Anxious;Flat affect   anxious during exam      Past Medical History:  Diagnosis Date  . Hypertension   . Stroke [facility])     No past surgical history on file.  There were no vitals filed for this visit.   Subjective Assessment - 11/02/17 1536    Subjective  Pt has not been able to go to day program due to fecal incontinence.  Now having BM about every 2-3 days and it is liquid    Patient is accompained by:  Family member   mom   Pertinent History  History of stroke with right hemiparesis    Patient Stated Goals  return to day program    Currently in Pain?  No/denies         [facility] PT Assessment - 11/03/17 0001      Assessment   Medical Diagnosis  N39.498 (ICD-10-CM) - Other specified urinary incontinence;R15.9 (ICD-10-CM) - Full incontinence of feces    Referring Provider (PT)  Cathie Hoops, [place]    Onset Date/Surgical Date  08/12/16    Hand Dominance  Right      Precautions   Precautions  Fall      Balance Screen   Has the patient fallen in the past 6 months  Yes    How many times?  4   slides out of the chair   Has the patient had a decrease in activity level because of a fear of falling?   Yes    Is the patient reluctant to leave their home because of a fear of falling?   No      Home  Public house manager residence    Living Arrangements  Parent    Additional Comments  lives with mom, needs help with dressing and doing hair      Prior Function   Level of Independence  Needs assistance with ADLs;Needs assistance with gait;Requires assistive device for independence;Needs assistance with transfers    Vocation  On disability    Vocation Requirements  Stroke 2012    Leisure  goes to a day program 4 hours a day;       Cognition   Overall Cognitive Status  Within Functional Limits for tasks assessed    Memory  Impaired   mom not sure, but appears forgetful     AROM   Overall AROM   Deficits    Overall AROM Comments  Rt upper and LE due to muscle spasms      Strength   Overall Strength Comments  Rt LE 4-/5 hip and knee; Rt ankle 2/5 MMT      Palpation   Palpation comment  severe tenderness and tight calf, hamstring, glutes  Ambulation/Gait   Assistive device  Small based quad cane    Gait Pattern  Within Functional Limits;Right hip hike;Right circumduction;Decreased dorsiflexion - right                Objective measurements completed on examination: See above findings.    Pelvic Floor Special Questions - 11/03/17 0001    Prior Pelvic/Prostate Exam  No    Prior Pregnancies  No    Urinary Leakage  Yes    How often  every time I have to go    Pad use  pull up x3/day    Activities that cause leaking  Coughing;Sneezing;With strong urge;Lifting    Urinary urgency  Yes    Fecal incontinence  Yes    Falling out feeling (prolapse)  No    Skin Integrity  Irritaion present at    Skin Integrity Irritation Present at  around anal opening - raw and tender    Prolapse  None    Pelvic Floor Internal Exam  pt identity confirmed and family member (mom) present, informed and consent given to perform internal soft tissue assessment    Exam Type  Rectal    Sensation  hypersensative    Palpation  weak low tone external sphincer, internal  sphincer and puborectalis high tone and very sensative    Strength  Flicker    Strength # of reps  1    Strength # of seconds  1    Tone  high               PT Education - 11/02/17 1741    Education provided  Yes    Education Details   Access Code: [email or web address] , abdominal massage, type of fiber    Person(s) Educated  Patient    Methods  Explanation;Demonstration;Handout;Verbal cues    Comprehension  Verbalized understanding;Returned demonstration          PT Long Term Goals - 11/02/17 1819      PT LONG TERM GOAL #1   Title  patient and or mom is independent with advanced HEP    Time  12    Period  Weeks    Status  New    Target Date  01/25/18      PT LONG TERM GOAL #2   Title  pt will be able to regular BM every day due to improved muscle coordination      Time  12    Period  Weeks    Status  New    Target Date  01/25/18      PT LONG TERM GOAL #3   Title  Pt will demonstrate ability to bulge pelvic floor muscle due to improved coordination     Time  12    Period  Weeks    Status  New    Target Date  01/25/17      PT LONG TERM GOAL #4   Title  Pt will demonstrate ability to contract pelvic floor muscle with at least 2/5 MMT due to improved strength    Time  12    Period  Weeks    Status  New    Target Date  01/25/17             Plan - 11/02/17 1743    Clinical Impression Statement  Pt presents to clinic with her mother who provided much of the history.  Pt appears to understand everything in eval and has normal cognitive function but seems to  be a poor historian about her falls and bowel leakage until her mother reminds her.  Pt has Rt side weakness mentioned above due to CVA in 2010 . Pt has high tone pelvic floor and irritated skin around the anus.  Pt did not tolerate internal exam past the puborectalis muscle due to pain.  Hard stool palpated just past the puborectalis.  Pt has flicker when MMT pelvic contraciton.  She has no bulge and lacks  coordination demonstrating valsalva maneuver when attempting to have BM.  Pt will benefit from skilled PT in order to address these impairments so she can return to the day program she was attending.    History and Personal Factors relevant to plan of care:  stroke with hemiparesis Rt UE/LE, chronic condition    Clinical Presentation  Evolving    Clinical Presentation due to:  pt has had worsening incontinence    Clinical Decision Making  Moderate    Rehab Potential  Excellent    PT Frequency  2x / week   or as able based on insurance   PT Duration  12 weeks    PT Treatment/Interventions  ADLs/Self Care Home Management;Biofeedback;Therapeutic activities;Therapeutic exercise;Neuromuscular re-education;Manual techniques;Passive range of motion;Dry needling;Taping    PT Next Visit Plan  abdominal massage, stretches, breathing techniques, biofeedback, review skin care    PT Home Exercise Plan   Access Code: [email or web address]     Recommended Other Services  AFO? eval 11/02/17    Consulted and Agree with Plan of Care  Patient       Patient will benefit from skilled therapeutic intervention in order to improve the following deficits and impairments:  Impaired flexibility, Pain, Decreased range of motion, Increased muscle spasms, Impaired tone, Increased fascial restricitons, Decreased strength  Visit Diagnosis: Unspecified lack of coordination - Plan: PT plan of care cert/re-cert  Muscle weakness (generalized) - Plan: PT plan of care cert/re-cert     Problem List Patient Active Problem List   Diagnosis Date Noted  . CVA (cerebrovascular accident) (HCC) 04/24/2017  . Depression 04/24/2017  . Genital herpes 04/24/2017  . Hyperlipidemia, unspecified 04/24/2017  . Hypertension 04/24/2017  . Migraine headache 04/24/2017  . Prediabetes 10/24/2014  . 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 Absence of bladder continence 11/18/2013  . Eczema 08/22/2012  . Contraception 08/25/2010     Vincente Poli, PT 11/03/2017, 11:02 AM  [facility] [address] [place], [place], [address] Phone: [phone]   Fax:  [phone]  Name: MUSKAAN SMET MRN: [id number] Date of Birth: 18-Feb-1985

## 2017-11-20 ENCOUNTER — Ambulatory Visit: Payer: Medicaid Other | Admitting: Physical Therapy

## 2017-11-21 ENCOUNTER — Ambulatory Visit: Payer: Medicaid Other | Attending: Family Medicine | Admitting: Physical Therapy

## 2017-11-21 DIAGNOSIS — R279 Unspecified lack of coordination: Secondary | ICD-10-CM | POA: Diagnosis not present

## 2017-11-21 DIAGNOSIS — M6281 Muscle weakness (generalized): Secondary | ICD-10-CM | POA: Insufficient documentation

## 2017-11-21 NOTE — Patient Instructions (Signed)
Access Code: [email or web address]  URL: [email or web address]  Date: 11/21/2017  Prepared by: Dorie RankJacqueline Crosser   Exercises  Seated Hamstring Stretch - 3 reps - 1 sets - 30 sec hold - 1x daily - 7x weekly  Seated Piriformis Stretch with Trunk Bend - 3 reps - 1 sets - 30 sec hold - 1x daily - 7x weekly  Ball squeeze with Kegel - 10 reps - 1 sets - 2 sec hold, 5 sec rest hold - 4x daily - 7x weekly  Hooklying Small March - 10 reps - 1 sets - 2 sec, rest 5 seconds hold - 1x daily - 7x weekly

## 2017-11-21 NOTE — Therapy (Signed)
[facility] [address] [place], [place], [address] Phone: [phone]   Fax:  [phone]  Physical Therapy Treatment  Patient Details  Name: Stacey [name] MRN: [id number] Date of Birth: 06-10-1985 Referring Provider (PT): Fabienne Bruns [place], [place]   Encounter Date: 11/21/2017  PT End of Session - 11/21/17 0817    Visit Number  2    Date for PT Re-Evaluation  01/25/18    Authorization Type  Medicaid     PT Start Time  0807    PT Stop Time  0845    PT Time Calculation (min)  38 min    Activity Tolerance  Patient tolerated treatment well;Patient limited by pain    Behavior During Therapy  [facility] for tasks assessed/performed       Past Medical History:  Diagnosis Date  . Hypertension   . Stroke [facility])     No past surgical history on file.  There were no vitals filed for this visit.  Subjective Assessment - 11/21/17 0825    Subjective  Pt was not a very good historian reporting that she went to day program yesterday and then said yes when asked if she stayed at home.    Currently in Pain?  No/denies                       [facility] Adult PT Treatment/Exercise - 11/21/17 0001      Neuro Re-ed    Neuro Re-ed Details   diaphragmatic breathing in supine      Exercises   Exercises  Lumbar      Lumbar Exercises: Stretches   Active Hamstring Stretch  Right;Left;30 seconds;2 reps    Piriformis Stretch  Right;Left;2 reps;20 seconds      Lumbar Exercises: Supine   Glut Set Limitations  kegel in supine and sidelying    Bent Knee Raise  10 reps   with kegel   Other Supine Lumbar Exercises  ball squeeze with kegel - tactile cues externally - 2 sec holds             PT Education - 11/21/17 0850    Education provided  Yes    Education Details   Access Code: [email or web address]     Person(s) Educated  Patient    Methods  Explanation;Demonstration;Handout;Verbal cues;Tactile cues    Comprehension   Verbalized understanding;Returned demonstration          PT Long Term Goals - 11/02/17 1819      PT LONG TERM GOAL #1   Title  patient and or mom is independent with advanced HEP    Time  12    Period  Weeks    Status  New    Target Date  01/25/18      PT LONG TERM GOAL #2   Title  pt will be able to regular BM every day due to improved muscle coordination      Time  12    Period  Weeks    Status  New    Target Date  01/25/18      PT LONG TERM GOAL #3   Title  Pt will demonstrate ability to bulge pelvic floor muscle due to improved coordination     Time  12    Period  Weeks    Status  New    Target Date  01/25/17      PT LONG TERM GOAL #4   Title  Pt  will demonstrate ability to contract pelvic floor muscle with at least 2/5 MMT due to improved strength    Time  12    Period  Weeks    Status  New    Target Date  01/25/17            Plan - 11/21/17 0846    Clinical Impression Statement  Pt did well with tactile cues for feedback to pelvic floor.  She needed cues to slow down to give her muscles time to relax after each rep.  Pt is able to hold for 2 seconds and needed 5 seconds to return to resting state.  She was educated in and performed diaphragmatic breathing to assist in relaxing.    PT Treatment/Interventions  ADLs/Self Care Home Management;Biofeedback;Therapeutic activities;Therapeutic exercise;Neuromuscular re-education;Manual techniques;Passive range of motion;Dry needling;Taping    PT Next Visit Plan  progress strengthening as able, abdominal massage, stretches, breathing techniques, biofeedback, review skin care    PT Home Exercise Plan   Access Code: [email or web address]     Consulted and Agree with Plan of Care  Patient       Patient will benefit from skilled therapeutic intervention in order to improve the following deficits and impairments:  Impaired flexibility, Pain, Decreased range of motion, Increased muscle spasms, Impaired tone, Increased fascial  restricitons, Decreased strength  Visit Diagnosis: Unspecified lack of coordination  Muscle weakness (generalized)     Problem List Patient Active Problem List   Diagnosis Date Noted  . CVA (cerebrovascular accident) (HCC) 04/24/2017  . Depression 04/24/2017  . Genital herpes 04/24/2017  . Hyperlipidemia, unspecified 04/24/2017  . Hypertension 04/24/2017  . Migraine headache 04/24/2017  . Prediabetes 10/24/2014  . 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 Absence of bladder continence 11/18/2013  . Eczema 08/22/2012  . Contraception 08/25/2010    Vincente PoliJakki Crosser, PT 11/21/2017, 8:55 AM  [facility] [address] [place], [place], [address] Phone: [phone]   Fax:  [phone]  Name: [name] MRN: [id number] Date of Birth: May 02, 1985

## 2017-11-24 ENCOUNTER — Ambulatory Visit: Payer: Medicaid Other | Admitting: Physical Therapy

## 2017-11-24 DIAGNOSIS — R279 Unspecified lack of coordination: Secondary | ICD-10-CM

## 2017-11-24 DIAGNOSIS — M6281 Muscle weakness (generalized): Secondary | ICD-10-CM

## 2017-11-24 NOTE — Patient Instructions (Signed)
Toileting Techniques for Bowel Movements (Defecation) Using your belly (abdomen) and pelvic floor muscles to have a bowel movement is usually instinctive.  Sometimes people can have problems with these muscles and have to relearn proper defecation (emptying) techniques.  If you have weakness in your muscles, organs that are falling out, decreased sensation in your pelvis, or ignore your urge to go, you may find yourself straining to have a bowel movement.  You are straining if you are: . holding your breath or taking in a huge gulp of air and holding it  . keeping your lips and jaw tensed and closed tightly . turning red in the face because of excessive pushing or forcing . developing or worsening your  hemorrhoids . getting faint while pushing . not emptying completely and have to defecate many times a day  If you are straining, you are actually making it harder for yourself to have a bowel movement.  Many people find they are pulling up with the pelvic floor muscles and closing off instead of opening the anus. Due to lack pelvic floor relaxation and coordination the abdominal muscles, one has to work harder to push the feces out.  Many people have never been taught how to defecate efficiently and effectively.  Notice what happens to your body when you are having a bowel movement.  While you are sitting on the toilet pay attention to the following areas: . Jaw and mouth position . Angle of your hips   . Whether your feet touch the ground or not . Arm placement  . Spine position . Waist . Belly tension . Anus (opening of the anal canal)  An Evacuation/Defecation Plan   Here are the 4 basic points:  1. Lean forward enough for your elbows to rest on your knees 2. Support your feet on the floor or use a low stool if your feet don't touch the floor  3. Push out your belly as if you have swallowed a beach ball-you should feel a widening of your waist 4. Open and relax your pelvic floor muscles,  rather than tightening around the anus      The following conditions my require modifications to your toileting posture:  . If you have had surgery in the past that limits your back, hip, pelvic, knee or ankle flexibility . Constipation   Your healthcare practitioner may make the following additional suggestions and adjustments:  1) Sit on the toilet  a) Make sure your feet are supported. b) Notice your hip angle and spine position-most people find it effective to lean forward or raise their knees, which can help the muscles around the anus to relax  c) When you lean forward, place your forearms on your thighs for support  2) Relax suggestions a) Breath deeply in through your nose and out slowly through your mouth as if you are smelling the flowers and blowing out the candles. b) To become aware of how to relax your muscles, contracting and releasing muscles can be helpful.  Pull your pelvic floor muscles in tightly by using the image of holding back gas, or closing around the anus (visualize making a circle smaller) and lifting the anus up and in.  Then release the muscles and your anus should drop down and feel open. Repeat 5 times ending with the feeling of relaxation. c) Keep your pelvic floor muscles relaxed; let your belly bulge out. d) The digestive tract starts at the mouth and ends at the anal opening, so be   sure to relax both ends of the tube.  Place your tongue on the roof of your mouth with your teeth separated.  This helps relax your mouth and will help to relax the anus at the same time.  3) Empty (defecation) a) Keep your pelvic floor and sphincter relaxed, then bulge your anal muscles.  Make the anal opening wide.  b) Stick your belly out as if you have swallowed a beach ball. c) Make your belly wall hard using your belly muscles while continuing to breathe. Doing this makes it easier to open your anus. d) Breath out and give a grunt (or try using other sounds such as  ahhhh, shhhhh, ohhhh or grrrrrrr).  4) Finish a) As you finish your bowel movement, pull the pelvic floor muscles up and in.  This will leave your anus in the proper place rather than remaining pushed out and down. If you leave your anus pushed out and down, it will start to feel as though that is normal and give you incorrect signals about needing to have a bowel movement.   [facility] [address] [place], [place] [address]  

## 2017-11-24 NOTE — Therapy (Signed)
[facility] [address], [address] [place], [place], [address] Phone: [phone]   Fax:  [phone]  Physical Therapy Treatment  Patient Details  Name: Stacey JacksonLatasha R [name] MRN: [id number] Date of Birth: [birth year] Referring Provider (PT): Fabienne Brunswens, Leanne ArabiWhaley, [place]   Encounter Date: 11/24/2017  PT End of Session - 11/24/17 1101    Visit Number  3    Date for PT Re-Evaluation  01/25/18    Authorization Type  Medicaid     Authorization - Visit Number  2    Authorization - Number of Visits  3    PT Start Time  0934    PT Stop Time  1014    PT Time Calculation (min)  40 min    Activity Tolerance  Patient tolerated treatment well;Patient limited by pain    Behavior During Therapy  [facility] for tasks assessed/performed       Past Medical History:  Diagnosis Date  . Hypertension   . Stroke [facility](HCC)     No past surgical history on file.  There were no vitals filed for this visit.  Subjective Assessment - 11/24/17 0938    Subjective  Pt hasn't had any accidents at the program.  But having accidents on the way home sometimes.      Patient is accompained by:  Family member   mother   Patient Stated Goals  return to day program    Currently in Pain?  No/denies                       [facility] - 11/24/17 0001      Self-Care   Self-Care  Other Self-Care Comments    Other Self-Care Comments   educated patient with caregiver for how to do HEP; briefly reviewed toilet techniques      Lumbar Exercises: Stretches   Active Hamstring Stretch  Right;Left;30 seconds;2 reps    Piriformis Stretch  Right;Left;2 reps;20 seconds   figure 4 and knee to opposite chest     Lumbar Exercises: Supine   Bent Knee Raise  10 reps   with kegel   Other Supine Lumbar Exercises  ball squeeze with kegel - tactile cues externally - 2 sec holds      Manual Therapy   Manual Therapy  Myofascial release    Myofascial  Release  abdominal fascial release             PT Education - 11/24/17 1014    Education provided  Yes    Education Details   Access Code: [email or web address] ; toilet techniques          PT Long Term Goals - 11/24/17 1102      PT LONG TERM GOAL #1   Title  patient and or mom is independent with advanced HEP    Status  On-going      PT LONG TERM GOAL #2   Title  pt will be able to regular BM every day due to improved muscle coordination      Status  On-going      PT LONG TERM GOAL #3   Title  Pt will demonstrate ability to bulge pelvic floor muscle due to improved coordination     Status  On-going      PT LONG TERM GOAL #4   Title  Pt will demonstrate ability to contract pelvic floor muscle with at least 2/5 MMT due to improved strength  Status  On-going            Plan - 11/24/17 1234    Clinical Impression Statement  Pt did well performing exercises and stretches with patient's mom observing and participating.  Pt is doing better at maintaining control of BM during the day and has not had issue at day program but she is losing conrtol of BM at night.  Currently she is holding contraction for 2 seconds at the most. Pt will benefit from skilled PT to continue working on endurance of pelvic floor and lengthening of soft tissue adhesions.    PT Treatment/Interventions  ADLs/Self Care Home Management;Biofeedback;Therapeutic activities;Therapeutic exercise;Neuromuscular re-education;Manual techniques;Passive range of motion;Dry needling;Taping    PT Next Visit Plan  re- eval, biofeedback or tactile feedback to reassess strength and endurance    PT Home Exercise Plan   Access Code: [email or web address]     Consulted and Agree with Plan of Care  Patient       Patient will benefit from skilled therapeutic intervention in order to improve the following deficits and impairments:  Impaired flexibility, Pain, Decreased range of motion, Increased muscle spasms, Impaired tone, Increased fascial  restricitons, Decreased strength  Visit Diagnosis: Unspecified lack of coordination  Muscle weakness (generalized)     Problem List Patient Active Problem List   Diagnosis Date Noted  . CVA (cerebrovascular accident) (HCC) 04/24/2017  . Depression 04/24/2017  . Genital herpes 04/24/2017  . Hyperlipidemia, unspecified 04/24/2017  . Hypertension 04/24/2017  . Migraine headache 04/24/2017  . Prediabetes 10/24/2014  . 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 Absence of bladder continence 11/18/2013  . Eczema 08/22/2012  . Contraception 08/25/2010    Vincente Poli, PT 11/24/2017, 12:39 PM  [facility] [address] [place], [place], [address] Phone: [phone]   Fax:  [phone]  Name: Stacey [name] MRN: [id number] Date of Birth: 10-10-1985

## 2017-11-28 ENCOUNTER — Ambulatory Visit: Payer: Medicaid Other | Admitting: Physical Therapy

## 2017-11-28 DIAGNOSIS — M6281 Muscle weakness (generalized): Secondary | ICD-10-CM

## 2017-11-28 DIAGNOSIS — R279 Unspecified lack of coordination: Secondary | ICD-10-CM | POA: Diagnosis not present

## 2017-11-28 NOTE — Therapy (Addendum)
[facility] [address] [place], [place], [address] Phone: [phone]   Fax:  [phone]  Physical Therapy Treatment  Patient Details  Name: Stacey [name] MRN: [id number] Date of Birth: 1985-12-24 Referring Provider (PT): Lethea Killings [place], [place]   Encounter Date: 11/28/2017  PT End of Session - 11/28/17 1625    Visit Number  4    Date for PT Re-Evaluation  01/25/18    Authorization Type  Medicaid     Authorization - Visit Number  3    Authorization - Number of Visits  3    PT Start Time  1618    PT Stop Time  1700    PT Time Calculation (min)  42 min    Activity Tolerance  Patient tolerated treatment well    Behavior During Therapy  [facility] for tasks assessed/performed       Past Medical History:  Diagnosis Date  . Hypertension   . Stroke [facility])     No past surgical history on file.  There were no vitals filed for this visit.  Subjective Assessment - 11/28/17 1621    Subjective  Pt hasn't had any accidents at the program lately.  She had an accident in her sleep last night but nothing other than that.      Patient is accompained by:  Family member    Pertinent History  History of stroke with right hemiparesis    Patient Stated Goals  return to day program    Currently in Pain?  No/denies         [facility] PT Assessment - 11/28/17 0001      Assessment   Medical Diagnosis  N39.498 (ICD-10-CM) - Other specified urinary incontinence;R15.9 (ICD-10-CM) - Full incontinence of feces    Referring Provider (PT)  Midge Minium, [place]    Onset Date/Surgical Date  08/12/16      Strength   Overall Strength Comments  Rt LE 4-/5 hip and knee; Rt ankle 2/5 MMT      Flexibility   Soft Tissue Assessment /Muscle Length  yes    Hamstrings  35 degrees of left hip flexion ; 45 degrees of Rt hip flexion      Palpation   Palpation comment  muscle spasms and tenderness hip adductors and hamstrings; tenderness at  rectus abdominus distal attachment                Pelvic Floor Special Questions - 11/28/17 0001    Pad use  pull up x3/day    Fecal incontinence  Yes   2x/week; been able to hold it at day program   Skin Integrity  Irritaion present at    Skin Integrity Irritation Present at  left labia minora    Perineal Body/Introitus   Elevated    Pelvic Floor Internal Exam  pt identity confirmed and family member (mom) present, informed and consent given to perform internal soft tissue assessment    Exam Type  Vaginal    Sensation  hypersensative    Palpation  spasm and tender to palpation ischiocavernosis and periurethral fascia    Strength  Flicker    Strength # of seconds  1    Tone  high        [facility] Adult PT Treatment/Exercise - 11/28/17 0001      Neuro Re-ed    Neuro Re-ed Details   kegel and bulging pelvic floor with external tactile feedback      Lumbar  Exercises: Supine   Other Supine Lumbar Exercises  hip internal rotation stretch      Manual Therapy   Manual Therapy  Internal Pelvic Floor;Soft tissue mobilization    Manual therapy comments  pt identity confirmed and inofrmed consent was given to perform internal soft tissue assessemnt and treatment    Soft tissue mobilization  bilateral adductors    Internal Pelvic Floor  external to ischiocavernosis bilaterally; transverse peroneus bilaterally,                  PT Long Term Goals - 11/28/17 1626      PT LONG TERM GOAL #1   Title  patient and or mom is independent with advanced HEP    Baseline  still learning    Status  On-going      PT LONG TERM GOAL #2   Title  pt will be able to regular BM every day due to improved muscle coordination      Baseline  stool normal consistency, but some days no BM    Status  On-going      PT LONG TERM GOAL #3   Title  Pt will demonstrate ability to bulge pelvic floor muscle due to improved coordination     Baseline  able to bulge with correct breathing technique     Status  Achieved      PT LONG TERM GOAL #4   Title  Pt will demonstrate ability to contract pelvic floor muscle with at least 2/5 MMT due to improved strength    Baseline  1/5 MMT    Status  On-going      PT LONG TERM GOAL #5   Title  Pt will be able to demonstrate at least 60 degrees of Rt hip flexion with straight leg in supine for improved functional mobility to improve toileting technique.    Baseline  35 degrees    Time  4    Period  Weeks    Status  New    Target Date  12/26/17      Additional Long Term Goals   Additional Long Term Goals  Yes      PT LONG TERM GOAL #6   Title  Pt will be able to sustain pelvic floor contraction for 8 seconds in order to control BM    Baseline  1 sec    Time  4    Period  Weeks    Status  New    Target Date  12/26/17            Plan - 11/28/17 1744    Clinical Impression Statement  Pt has been able to make it through the day without bowel leakage.  Pt is still having leakage at the end of the day and night and is unable to know when she needs to use the bathroom.  She continues to have severe tenderness and muscle spasms as mentioned above. Pt has LE weakness.  She also has difficutly contracting pelvic floor without significant amount of co-contraction.  Pt is 1/5 MMT of pelvic floor strength with low endurnace.  She will benefit from continued skilled PT to ensure she is able to maximize function in order to continue going to her day program which she will be dismissed from if she has any fecal incontinence.    PT Treatment/Interventions  ADLs/Self Care Home Management;Biofeedback;Therapeutic activities;Therapeutic exercise;Neuromuscular re-education;Manual techniques;Passive range of motion;Dry needling;Taping    PT Next Visit Plan  STM to pelvic floor,  adductors, lumbar and LE; lumbar and thoracic ROM, abdominal fascial release    Consulted and Agree with Plan of Care  Patient;Family member/caregiver    Family Member Consulted   mother/caregiver       Patient will benefit from skilled therapeutic intervention in order to improve the following deficits and impairments:  Impaired flexibility, Pain, Decreased range of motion, Increased muscle spasms, Impaired tone, Increased fascial restricitons, Decreased strength  Visit Diagnosis: Unspecified lack of coordination  Muscle weakness (generalized)     Problem List Patient Active Problem List   Diagnosis Date Noted  . CVA (cerebrovascular accident) ([place]) 04/24/2017  . Depression 04/24/2017  . Genital herpes 04/24/2017  . Hyperlipidemia, unspecified 04/24/2017  . Hypertension 04/24/2017  . Migraine headache 04/24/2017  . Prediabetes 10/24/2014  . 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 Absence of bladder continence 11/18/2013  . Eczema 08/22/2012  . Contraception 08/25/2010    Zannie Cove, PT 11/28/2017, 6:36 PM  [facility] [address] [place], [place], [address] Phone: [phone]   Fax:  [phone]  Name: KARINE GARN MRN: [id number] Date of Birth: 03-Sep-1985  PHYSICAL THERAPY DISCHARGE SUMMARY  Visits from Start of Care: 4  Current functional level related to goals / functional outcomes: See above remaining   Remaining deficits: See above   Education / Equipment: HEP  Plan: Patient agrees to discharge.  Patient goals were not met. Patient is being discharged due to not returning since the last visit.  ?????     [employer], PT 01/04/18 10:33 AM

## 2018-01-23 ENCOUNTER — Encounter: Payer: Self-pay | Admitting: Podiatry

## 2018-01-23 ENCOUNTER — Ambulatory Visit (INDEPENDENT_AMBULATORY_CARE_PROVIDER_SITE_OTHER): Payer: Medicaid Other | Admitting: Podiatry

## 2018-01-23 DIAGNOSIS — B351 Tinea unguium: Secondary | ICD-10-CM

## 2018-01-23 DIAGNOSIS — E1142 Type 2 diabetes mellitus with diabetic polyneuropathy: Secondary | ICD-10-CM

## 2018-01-23 DIAGNOSIS — M79675 Pain in left toe(s): Secondary | ICD-10-CM

## 2018-01-23 DIAGNOSIS — M79674 Pain in right toe(s): Secondary | ICD-10-CM | POA: Diagnosis not present

## 2018-01-23 DIAGNOSIS — L84 Corns and callosities: Secondary | ICD-10-CM | POA: Diagnosis not present

## 2018-01-23 NOTE — Patient Instructions (Signed)
Onychomycosis/Fungal Toenails  WHAT IS IT? An infection that lies within the keratin of your nail plate that is caused by a fungus.  WHY ME? Fungal infections affect all ages, sexes, races, and creeds.  There may be many factors that predispose you to a fungal infection such as age, coexisting medical conditions such as diabetes, or an autoimmune disease; stress, medications, fatigue, genetics, etc.  Bottom line: fungus thrives in a warm, moist environment and your shoes offer such a location.  IS IT CONTAGIOUS? Theoretically, yes.  You do not want to share shoes, nail clippers or files with someone who has fungal toenails.  Walking around barefoot in the same room or sleeping in the same bed is unlikely to transfer the organism.  It is important to realize, however, that fungus can spread easily from one nail to the next on the same foot.  HOW DO WE TREAT THIS?  There are several ways to treat this condition.  Treatment may depend on many factors such as age, medications, pregnancy, liver and kidney conditions, etc.  It is best to ask your doctor which options are available to you.  1. No treatment.   Unlike many other medical concerns, you can live with this condition.  However for many people this can be a painful condition and may lead to ingrown toenails or a bacterial infection.  It is recommended that you keep the nails cut short to help reduce the amount of fungal nail. 2. Topical treatment.  These range from herbal remedies to prescription strength nail lacquers.  About 40-50% effective, topicals require twice daily application for approximately 9 to 12 months or until an entirely new nail has grown out.  The most effective topicals are medical grade medications available through physicians offices. 3. Oral antifungal medications.  With an 80-90% cure rate, the most common oral medication requires 3 to 4 months of therapy and stays in your system for a year as the new nail grows out.  Oral  antifungal medications do require blood work to make sure it is a safe drug for you.  A liver function panel will be performed prior to starting the medication and after the first month of treatment.  It is important to have the blood work performed to avoid any harmful side effects.  In general, this medication safe but blood work is required. 4. Laser Therapy.  This treatment is performed by applying a specialized laser to the affected nail plate.  This therapy is noninvasive, fast, and non-painful.  It is not covered by insurance and is therefore, out of pocket.  The results have been very good with a 80-95% cure rate.  The [facility] is the only practice in the area to offer this therapy. 5. Permanent Nail Avulsion.  Removing the entire nail so that a new nail will not grow back.  Corns and Calluses Corns are small areas of thickened skin that occur on the top, sides, or tip of a toe. They contain a cone-shaped core with a point that can press on a nerve below. This causes pain.  Calluses are areas of thickened skin that can occur anywhere on the body, including the hands, fingers, palms, soles of the feet, and heels. Calluses are usually larger than corns. What are the causes? Corns and calluses are caused by rubbing (friction) or pressure, such as from shoes that are too tight or do not fit properly. What increases the risk? Corns are more likely to develop in people   who have misshapen toes (toe deformities), such as hammer toes. Calluses can occur with friction to any area of the skin. They are more likely to develop in people who:  Work with their hands.  Wear shoes that fit poorly, are too tight, or are high-heeled.  Have toe deformities. What are the signs or symptoms? Symptoms of a corn or callus include:  A hard growth on the skin.  Pain or tenderness under the skin.  Redness and swelling.  Increased discomfort while wearing tight-fitting shoes, if your feet are  affected. If a corn or callus becomes infected, symptoms may include:  Redness and swelling that gets worse.  Pain.  Fluid, blood, or pus draining from the corn or callus. How is this diagnosed? Corns and calluses may be diagnosed based on your symptoms, your medical history, and a physical exam. How is this treated? Treatment for corns and calluses may include:  Removing the cause of the friction or pressure. This may involve: ? Changing your shoes. ? Wearing shoe inserts (orthotics) or other protective layers in your shoes, such as a corn pad. ? Wearing gloves.  Applying medicine to the skin (topical medicine) to help soften skin in the hardened, thickened areas.  Removing layers of dead skin with a file to reduce the size of the corn or callus.  Removing the corn or callus with a scalpel or laser.  Taking antibiotic medicines, if your corn or callus is infected.  Having surgery, if a toe deformity is the cause. Follow these instructions at home:   Take over-the-counter and prescription medicines only as told by your health care provider.  If you were prescribed an antibiotic, take it as told by your health care provider. Do not stop taking it even if your condition starts to improve.  Wear shoes that fit well. Avoid wearing high-heeled shoes and shoes that are too tight or too loose.  Wear any padding, protective layers, gloves, or orthotics as told by your health care provider.  Soak your hands or feet and then use a file or pumice stone to soften your corn or callus. Do this as told by your health care provider.  Check your corn or callus every day for symptoms of infection. Contact a health care provider if you:  Notice that your symptoms do not improve with treatment.  Have redness or swelling that gets worse.  Notice that your corn or callus becomes painful.  Have fluid, blood, or pus coming from your corn or callus.  Have new symptoms. Summary  Corns are  small areas of thickened skin that occur on the top, sides, or tip of a toe.  Calluses are areas of thickened skin that can occur anywhere on the body, including the hands, fingers, palms, and soles of the feet. Calluses are usually larger than corns.  Corns and calluses are caused by rubbing (friction) or pressure, such as from shoes that are too tight or do not fit properly.  Treatment may include wearing any padding, protective layers, gloves, or orthotics as told by your health care provider. This information is not intended to replace advice given to you by your health care provider. Make sure you discuss any questions you have with your health care provider. Document Released: 09/26/2003 Document Revised: 11/02/2016 Document Reviewed: 11/02/2016 Elsevier Interactive Patient Education  2019 Elsevier Inc.  

## 2018-02-05 NOTE — Progress Notes (Signed)
Subjective: Stacey [name] presents with diabetes, diabetic neuropathy and cc of painful, discolored, thick toenails and painful calluses to both feet.   She is accompanied by her mother on today.  She nor her mother relate any new concerns on today's visit.  [place] is her primary care physician.   Current Outpatient Medications:  .  ARIPiprazole (ABILIFY) 2 MG tablet, Take 2 mg by mouth daily., Disp: , Rfl:  .  aspirin 325 MG tablet, Take 325 mg by mouth daily., Disp: , Rfl:  .  atorvastatin (LIPITOR) 40 MG tablet, Take 40 mg by mouth daily., Disp: , Rfl:  .  atorvastatin (LIPITOR) 80 MG tablet, , Disp: , Rfl: 3 .  baclofen (LIORESAL) 10 MG tablet, Take 10 mg by mouth 3 (three) times daily., Disp: , Rfl:  .  clopidogrel (PLAVIX) 75 MG tablet, Take 75 mg by mouth daily., Disp: , Rfl:  .  fluticasone (FLONASE) 50 MCG/ACT nasal spray, Place into the nose., Disp: , Rfl:  .  Incontinence Supply Disposable (UNDERPADS) MISC, 1 CASE + 5 PACKS PEACH!, Disp: , Rfl:  .  lisdexamfetamine (VYVANSE) 30 MG capsule, TK 1 C PO QAM, Disp: , Rfl:  .  medroxyPROGESTERone (DEPO-PROVERA) 150 MG/ML injection, Inject into the muscle., Disp: , Rfl:  .  metFORMIN (GLUCOPHAGE) 500 MG tablet, TK 1 T PO D WITH BRE, Disp: , Rfl: 2 .  metoprolol tartrate (LOPRESSOR) 25 MG tablet, Take 25 mg by mouth 2 (two) times daily., Disp: , Rfl:  .  ramipril (ALTACE) 5 MG capsule, Take 5 mg by mouth daily., Disp: , Rfl:  .  sertraline (ZOLOFT) 100 MG tablet, Take 100 mg by mouth daily., Disp: , Rfl:  .  valACYclovir (VALTREX) 500 MG tablet, 1 tab by mouth three times daily for 5 days as needed, Disp: , Rfl:   No Known Allergies  Vascular Examination: Capillary refill time immediate x 10 digits Dorsalis pedis and Posterior tibial pulses are both 2/4 bilaterally b/l No digital hair x 10 digits Skin temperature warm to cool b/l  Dermatological Examination: Skin with normal turgor, texture and tone  b/l  Toenails 1-5 b/l discolored, thick, dystrophic with subungual debris and pain with palpation to nailbeds due to thickness of nails.  Hyperkeratotic lesions noted submetatarsal head 5 left, submetatarsal head to left, and plantar medial hallux IPJ bilaterally.  There is no erythema, no edema, no drainage, no flocculence noted with either of the lesions.  No impending wounds.  Musculoskeletal: Muscle strength 5/5 to all LE muscle groups left lower extremity Muscle strength 3/5 to right lower extremity muscle groups   Neurological: Sensation diminished with 10 gram monofilament right foot; intact left foot  Assessment: 1. Painful onychomycosis toenails 1-5 b/l 2. Calluses submetatarsal head 5 left, submetatarsal head to left, and plantar medial hallux IPJ bilaterally 3. NIDDM with Diabetic neuropathy  Plan: 1. Continue diabetic foot care principles.  Literature dispensed on today 2. Toenails 1-5 b/l were debrided in length and girth without iatrogenic bleeding. 3. Hyperkeratotic lesion pared with sterile chisel blade  submetatarsal head 5 left, submetatarsal head to left, and plantar medial hallux IPJ bilaterally 4. Patient to continue soft, supportive shoe gear 5. Patient to report any pedal injuries to medical professional  6. Follow up 3 months. Patient/POA to call should there be a concern in the interim.

## 2018-04-25 ENCOUNTER — Encounter: Payer: Self-pay | Admitting: Podiatry

## 2018-04-25 ENCOUNTER — Ambulatory Visit: Payer: Medicaid Other | Admitting: Podiatry

## 2018-04-25 ENCOUNTER — Other Ambulatory Visit: Payer: Self-pay

## 2018-04-25 VITALS — Temp 97.7°F

## 2018-04-25 DIAGNOSIS — B351 Tinea unguium: Secondary | ICD-10-CM

## 2018-04-25 DIAGNOSIS — M79675 Pain in left toe(s): Secondary | ICD-10-CM

## 2018-04-25 DIAGNOSIS — L84 Corns and callosities: Secondary | ICD-10-CM

## 2018-04-25 DIAGNOSIS — M79674 Pain in right toe(s): Secondary | ICD-10-CM | POA: Diagnosis not present

## 2018-04-25 DIAGNOSIS — E1142 Type 2 diabetes mellitus with diabetic polyneuropathy: Secondary | ICD-10-CM

## 2018-04-25 NOTE — Patient Instructions (Signed)
Onychomycosis/Fungal Toenails  WHAT IS IT? An infection that lies within the keratin of your nail plate that is caused by a fungus.  WHY ME? Fungal infections affect all ages, sexes, races, and creeds.  There may be many factors that predispose you to a fungal infection such as age, coexisting medical conditions such as diabetes, or an autoimmune disease; stress, medications, fatigue, genetics, etc.  Bottom line: fungus thrives in a warm, moist environment and your shoes offer such a location.  IS IT CONTAGIOUS? Theoretically, yes.  You do not want to share shoes, nail clippers or files with someone who has fungal toenails.  Walking around barefoot in the same room or sleeping in the same bed is unlikely to transfer the organism.  It is important to realize, however, that fungus can spread easily from one nail to the next on the same foot.  HOW DO WE TREAT THIS?  There are several ways to treat this condition.  Treatment may depend on many factors such as age, medications, pregnancy, liver and kidney conditions, etc.  It is best to ask your doctor which options are available to you.  1. No treatment.   Unlike many other medical concerns, you can live with this condition.  However for many people this can be a painful condition and may lead to ingrown toenails or a bacterial infection.  It is recommended that you keep the nails cut short to help reduce the amount of fungal nail. 2. Topical treatment.  These range from herbal remedies to prescription strength nail lacquers.  About 40-50% effective, topicals require twice daily application for approximately 9 to 12 months or until an entirely new nail has grown out.  The most effective topicals are medical grade medications available through physicians offices. 3. Oral antifungal medications.  With an 80-90% cure rate, the most common oral medication requires 3 to 4 months of therapy and stays in your system for a year as the new nail grows out.  Oral  antifungal medications do require blood work to make sure it is a safe drug for you.  A liver function panel will be performed prior to starting the medication and after the first month of treatment.  It is important to have the blood work performed to avoid any harmful side effects.  In general, this medication safe but blood work is required. 4. Laser Therapy.  This treatment is performed by applying a specialized laser to the affected nail plate.  This therapy is noninvasive, fast, and non-painful.  It is not covered by insurance and is therefore, out of pocket.  The results have been very good with a 80-95% cure rate.  The [facility] is the only practice in the area to offer this therapy. 5. Permanent Nail Avulsion.  Removing the entire nail so that a new nail will not grow back.  Corns and Calluses Corns are small areas of thickened skin that occur on the top, sides, or tip of a toe. They contain a cone-shaped core with a point that can press on a nerve below. This causes pain.  Calluses are areas of thickened skin that can occur anywhere on the body, including the hands, fingers, palms, soles of the feet, and heels. Calluses are usually larger than corns. What are the causes? Corns and calluses are caused by rubbing (friction) or pressure, such as from shoes that are too tight or do not fit properly. What increases the risk? Corns are more likely to develop in people   who have misshapen toes (toe deformities), such as hammer toes. Calluses can occur with friction to any area of the skin. They are more likely to develop in people who:  Work with their hands.  Wear shoes that fit poorly, are too tight, or are high-heeled.  Have toe deformities. What are the signs or symptoms? Symptoms of a corn or callus include:  A hard growth on the skin.  Pain or tenderness under the skin.  Redness and swelling.  Increased discomfort while wearing tight-fitting shoes, if your feet are  affected. If a corn or callus becomes infected, symptoms may include:  Redness and swelling that gets worse.  Pain.  Fluid, blood, or pus draining from the corn or callus. How is this diagnosed? Corns and calluses may be diagnosed based on your symptoms, your medical history, and a physical exam. How is this treated? Treatment for corns and calluses may include:  Removing the cause of the friction or pressure. This may involve: ? Changing your shoes. ? Wearing shoe inserts (orthotics) or other protective layers in your shoes, such as a corn pad. ? Wearing gloves.  Applying medicine to the skin (topical medicine) to help soften skin in the hardened, thickened areas.  Removing layers of dead skin with a file to reduce the size of the corn or callus.  Removing the corn or callus with a scalpel or laser.  Taking antibiotic medicines, if your corn or callus is infected.  Having surgery, if a toe deformity is the cause. Follow these instructions at home:   Take over-the-counter and prescription medicines only as told by your health care provider.  If you were prescribed an antibiotic, take it as told by your health care provider. Do not stop taking it even if your condition starts to improve.  Wear shoes that fit well. Avoid wearing high-heeled shoes and shoes that are too tight or too loose.  Wear any padding, protective layers, gloves, or orthotics as told by your health care provider.  Soak your hands or feet and then use a file or pumice stone to soften your corn or callus. Do this as told by your health care provider.  Check your corn or callus every day for symptoms of infection. Contact a health care provider if you:  Notice that your symptoms do not improve with treatment.  Have redness or swelling that gets worse.  Notice that your corn or callus becomes painful.  Have fluid, blood, or pus coming from your corn or callus.  Have new symptoms. Summary  Corns are  small areas of thickened skin that occur on the top, sides, or tip of a toe.  Calluses are areas of thickened skin that can occur anywhere on the body, including the hands, fingers, palms, and soles of the feet. Calluses are usually larger than corns.  Corns and calluses are caused by rubbing (friction) or pressure, such as from shoes that are too tight or do not fit properly.  Treatment may include wearing any padding, protective layers, gloves, or orthotics as told by your health care provider. This information is not intended to replace advice given to you by your health care provider. Make sure you discuss any questions you have with your health care provider. Document Released: 09/26/2003 Document Revised: 11/02/2016 Document Reviewed: 11/02/2016 Elsevier Interactive Patient Education  2019 Elsevier Inc.  

## 2018-05-02 NOTE — Progress Notes (Signed)
Subjective: Stacey [name] presents with diabetic neuropathy and cc of painful, discolored, thick toenails b/l. She also has painful calluses b/l.  Pain is aggravated when wearing enclosed shoe gear. Pain is relieved with periodic professional debridement.  [place] is her PCP.    Current Outpatient Medications:  .  ARIPiprazole (ABILIFY) 2 MG tablet, Take 2 mg by mouth daily., Disp: , Rfl:  .  aspirin 325 MG tablet, Take 325 mg by mouth daily., Disp: , Rfl:  .  atorvastatin (LIPITOR) 40 MG tablet, Take 40 mg by mouth daily., Disp: , Rfl:  .  atorvastatin (LIPITOR) 80 MG tablet, , Disp: , Rfl: 3 .  baclofen (LIORESAL) 10 MG tablet, Take 10 mg by mouth 3 (three) times daily., Disp: , Rfl:  .  clopidogrel (PLAVIX) 75 MG tablet, Take 75 mg by mouth daily., Disp: , Rfl:  .  fluticasone (FLONASE) 50 MCG/ACT nasal spray, Place into the nose., Disp: , Rfl:  .  Incontinence Supply Disposable (UNDERPADS) MISC, 1 CASE + 5 PACKS PEACH!, Disp: , Rfl:  .  lisdexamfetamine (VYVANSE) 30 MG capsule, TK 1 C PO QAM, Disp: , Rfl:  .  medroxyPROGESTERone (DEPO-PROVERA) 150 MG/ML injection, Inject into the muscle., Disp: , Rfl:  .  metFORMIN (GLUCOPHAGE) 500 MG tablet, TK 1 T PO D WITH BRE, Disp: , Rfl: 2 .  metoprolol tartrate (LOPRESSOR) 25 MG tablet, Take 25 mg by mouth 2 (two) times daily., Disp: , Rfl:  .  ramipril (ALTACE) 5 MG capsule, Take 5 mg by mouth daily., Disp: , Rfl:  .  sertraline (ZOLOFT) 100 MG tablet, Take 100 mg by mouth daily., Disp: , Rfl:  .  valACYclovir (VALTREX) 500 MG tablet, 1 tab by mouth three times daily for 5 days as needed, Disp: , Rfl:   No Known Allergies  Vascular Examination: Capillary refill time immediate x 10 digits.  Dorsalis pedis pulses palpable b/l.  Posterior tibial pulses palpable b/l.  Dgital hair absent x 10 digits.  Skin temperature gradient warm to cool b/l.  Dermatological Examination: Skin with normal turgor, texture and  tone b/l.  Toenails 1-5 b/l discolored, thick, dystrophic with subungual debris and pain with palpation to nailbeds due to thickness of nails.  Hyperkeratotic lesions submet head 5 left, submet head 2 left, plantar hallux IPJ b/l. No erythema, no edema, no drainage, no flocculence noted.   Musculoskeletal: Muscle strength 5/5 to all LE muscle groups LLE; 3/5 RLE.  Neurological: Sensation diminished with 10 gram monofilament.  Assessment: 1. Painful onychomycosis toenails 1-5 b/l 2. Calluses submet head 5 left, submet head 2 left, plantar hallux IPJ b/l 3. NIDDM with Diabetic neuropathy  Plan: 1. Continue diabetic foot care principles. Literature dispensed on today. 2. Toenails 1-5 b/l were debrided in length and girth without iatrogenic bleeding. 3. Calluses pared submetatarsal head(s)  5 left, submet head 2 left, plantar hallux IPJ b/l utilizing sterile scalpel blade without incident. Corn(s) pared utilizing sterile scalpel blade without incident.  4. Patient to continue soft, supportive shoe gear 5. Patient to report any pedal injuries to medical professional  6. Follow up 3 months.  7. Patient/POA to call should there be a concern in the interim.

## 2018-05-03 ENCOUNTER — Encounter: Payer: Self-pay | Admitting: Podiatry

## 2018-07-25 ENCOUNTER — Ambulatory Visit: Payer: Medicaid Other | Admitting: Podiatry

## 2018-07-25 ENCOUNTER — Other Ambulatory Visit: Payer: Self-pay

## 2018-07-25 ENCOUNTER — Encounter: Payer: Self-pay | Admitting: Podiatry

## 2018-07-25 DIAGNOSIS — B351 Tinea unguium: Secondary | ICD-10-CM | POA: Diagnosis not present

## 2018-07-25 DIAGNOSIS — M79675 Pain in left toe(s): Secondary | ICD-10-CM | POA: Diagnosis not present

## 2018-07-25 DIAGNOSIS — M79674 Pain in right toe(s): Secondary | ICD-10-CM

## 2018-07-25 NOTE — Patient Instructions (Signed)
Corns and Calluses Corns are small areas of thickened skin that occur on the top, sides, or tip of a toe. They contain a cone-shaped core with a point that can press on a nerve below. This causes pain.  Calluses are areas of thickened skin that can occur anywhere on the body, including the hands, fingers, palms, soles of the feet, and heels. Calluses are usually larger than corns. What are the causes? Corns and calluses are caused by rubbing (friction) or pressure, such as from shoes that are too tight or do not fit properly. What increases the risk? Corns are more likely to develop in people who have misshapen toes (toe deformities), such as hammer toes. Calluses can occur with friction to any area of the skin. They are more likely to develop in people who:  Work with their hands.  Wear shoes that fit poorly, are too tight, or are high-heeled.  Have toe deformities. What are the signs or symptoms? Symptoms of a corn or callus include:  A hard growth on the skin.  Pain or tenderness under the skin.  Redness and swelling.  Increased discomfort while wearing tight-fitting shoes, if your feet are affected. If a corn or callus becomes infected, symptoms may include:  Redness and swelling that gets worse.  Pain.  Fluid, blood, or pus draining from the corn or callus. How is this diagnosed? Corns and calluses may be diagnosed based on your symptoms, your medical history, and a physical exam. How is this treated? Treatment for corns and calluses may include:  Removing the cause of the friction or pressure. This may involve: ? Changing your shoes. ? Wearing shoe inserts (orthotics) or other protective layers in your shoes, such as a corn pad. ? Wearing gloves.  Applying medicine to the skin (topical medicine) to help soften skin in the hardened, thickened areas.  Removing layers of dead skin with a file to reduce the size of the corn or callus.  Removing the corn or callus with a  scalpel or laser.  Taking antibiotic medicines, if your corn or callus is infected.  Having surgery, if a toe deformity is the cause. Follow these instructions at home:   Take over-the-counter and prescription medicines only as told by your health care provider.  If you were prescribed an antibiotic, take it as told by your health care provider. Do not stop taking it even if your condition starts to improve.  Wear shoes that fit well. Avoid wearing high-heeled shoes and shoes that are too tight or too loose.  Wear any padding, protective layers, gloves, or orthotics as told by your health care provider.  Soak your hands or feet and then use a file or pumice stone to soften your corn or callus. Do this as told by your health care provider.  Check your corn or callus every day for symptoms of infection. Contact a health care provider if you:  Notice that your symptoms do not improve with treatment.  Have redness or swelling that gets worse.  Notice that your corn or callus becomes painful.  Have fluid, blood, or pus coming from your corn or callus.  Have new symptoms. Summary  Corns are small areas of thickened skin that occur on the top, sides, or tip of a toe.  Calluses are areas of thickened skin that can occur anywhere on the body, including the hands, fingers, palms, and soles of the feet. Calluses are usually larger than corns.  Corns and calluses are caused by   rubbing (friction) or pressure, such as from shoes that are too tight or do not fit properly.  Treatment may include wearing any padding, protective layers, gloves, or orthotics as told by your health care provider. This information is not intended to replace advice given to you by your health care provider. Make sure you discuss any questions you have with your health care provider. Document Released: 09/26/2003 Document Revised: 04/11/2018 Document Reviewed: 11/02/2016 Elsevier Patient Education  2020 [employer]   Onychomycosis/Fungal Toenails  WHAT IS IT? An infection that lies within the keratin of your nail plate that is caused by a fungus.  WHY ME? Fungal infections affect all ages, sexes, races, and creeds.  There may be many factors that predispose you to a fungal infection such as age, coexisting medical conditions such as diabetes, or an autoimmune disease; stress, medications, fatigue, genetics, etc.  Bottom line: fungus thrives in a warm, moist environment and your shoes offer such a location.  IS IT CONTAGIOUS? Theoretically, yes.  You do not want to share shoes, nail clippers or files with someone who has fungal toenails.  Walking around barefoot in the same room or sleeping in the same bed is unlikely to transfer the organism.  It is important to realize, however, that fungus can spread easily from one nail to the next on the same foot.  HOW DO WE TREAT THIS?  There are several ways to treat this condition.  Treatment may depend on many factors such as age, medications, pregnancy, liver and kidney conditions, etc.  It is best to ask your doctor which options are available to you.  1. No treatment.   Unlike many other medical concerns, you can live with this condition.  However for many people this can be a painful condition and may lead to ingrown toenails or a bacterial infection.  It is recommended that you keep the nails cut short to help reduce the amount of fungal nail. 2. Topical treatment.  These range from herbal remedies to prescription strength nail lacquers.  About 40-50% effective, topicals require twice daily application for approximately 9 to 12 months or until an entirely new nail has grown out.  The most effective topicals are medical grade medications available through physicians offices. 3. Oral antifungal medications.  With an 80-90% cure rate, the most common oral medication requires 3 to 4 months of therapy and stays in your system for a year as the new nail grows out.   Oral antifungal medications do require blood work to make sure it is a safe drug for you.  A liver function panel will be performed prior to starting the medication and after the first month of treatment.  It is important to have the blood work performed to avoid any harmful side effects.  In general, this medication safe but blood work is required. 4. Laser Therapy.  This treatment is performed by applying a specialized laser to the affected nail plate.  This therapy is noninvasive, fast, and non-painful.  It is not covered by insurance and is therefore, out of pocket.  The results have been very good with a 80-95% cure rate.  [facility] is the only practice in the area to offer this therapy. 5. Permanent Nail Avulsion.  Removing the entire nail so that a new nail will not grow back. 

## 2018-07-29 NOTE — Progress Notes (Signed)
Subjective: Stacey [name] presents to clinic with cc of painful mycotic toenails and callues b/l feet which are aggravated when weightbearing with and without shoe gear.  This pain limits her daily activities. Pain symptoms resolve with periodic professional debridement.  She is accompanied by her mom on today's visit. She voices no new pedal concerns on today's visit.  [place] is her PCP.    Current Outpatient Medications:  .  ARIPiprazole (ABILIFY) 2 MG tablet, Take 2 mg by mouth daily., Disp: , Rfl:  .  aspirin 325 MG tablet, Take 325 mg by mouth daily., Disp: , Rfl:  .  atorvastatin (LIPITOR) 40 MG tablet, Take 40 mg by mouth daily., Disp: , Rfl:  .  atorvastatin (LIPITOR) 80 MG tablet, , Disp: , Rfl: 3 .  baclofen (LIORESAL) 10 MG tablet, Take 10 mg by mouth 3 (three) times daily., Disp: , Rfl:  .  benzonatate (TESSALON) 200 MG capsule, TAKE ONE CAPSULE BY MOUTH THREE TIMES A DAY FOR UP TO 7 DAYS, Disp: , Rfl:  .  clopidogrel (PLAVIX) 75 MG tablet, Take 75 mg by mouth daily., Disp: , Rfl:  .  fluticasone (FLONASE) 50 MCG/ACT nasal spray, Place into the nose., Disp: , Rfl:  .  Incontinence Supply Disposable (UNDERPADS) MISC, 1 CASE + 5 PACKS PEACH!, Disp: , Rfl:  .  lisdexamfetamine (VYVANSE) 30 MG capsule, TK 1 C PO QAM, Disp: , Rfl:  .  medroxyPROGESTERone (DEPO-PROVERA) 150 MG/ML injection, Inject into the muscle., Disp: , Rfl:  .  metFORMIN (GLUCOPHAGE) 500 MG tablet, TK 1 T PO D WITH BRE, Disp: , Rfl: 2 .  metoprolol tartrate (LOPRESSOR) 25 MG tablet, Take 25 mg by mouth 2 (two) times daily., Disp: , Rfl:  .  ramipril (ALTACE) 5 MG capsule, Take 5 mg by mouth daily., Disp: , Rfl:  .  sertraline (ZOLOFT) 100 MG tablet, Take 100 mg by mouth daily., Disp: , Rfl:  .  valACYclovir (VALTREX) 500 MG tablet, 1 tab by mouth three times daily for 5 days as needed, Disp: , Rfl:    Allergies  Allergen Reactions  . Metformin Diarrhea     Objective: There were  no vitals filed for this visit.  Physical Examination:  Vascular  Examination: Capillary refill time immediate x 10 digits.  Palpable DP/PT pulses b/l.  Digital hair absent b/l.  No edema noted b/l.  Skin temperature gradient WNL b/l.  Dermatological Examination: Skin with normal turgor, texture and tone b/l.  No open wounds b/l.  No interdigital macerations noted b/l.  Elongated, thick, discolored brittle toenails with subungual debris and pain on dorsal palpation of nailbeds 1-5 b/l.  Hyperkeratotic lesion submet head 5 left, submet head 2 left, plantar hallux IPJ b/l with tenderness to palpation. No edema, no erythema, no drainage, no flocculence.  Musculoskeletal Examination: Muscle strength 5/5 to all muscle groups LLE; 3/5 RLE.  No pain, crepitus or joint discomfort with active/passive ROM.  Neurological Examination: Sensation diminished with 10 gram monofilament.  Assessment: 1. Mycotic nail infection with pain 1-5 b/l 2. Calluses submet head 5 left, submet head 2 left, plantar hallux IPJ b/l  3.  Neuropathy  Plan: 1. Toenails 1-5 b/l were debrided in length and girth without iatrogenic laceration.. 2. Calluses pared submet head 5 left, submet head 2 left, plantar hallux IPJ b/l utilizing sterile scalpel blade without incident. Continue soft, supportive shoe gear daily. Report any pedal injuries to medical professional. Follow up 3 months. Patient/POA to  call should there be a question/concern in there interim.

## 2018-10-24 ENCOUNTER — Ambulatory Visit: Payer: Medicaid Other | Admitting: Podiatry

## 2018-10-26 ENCOUNTER — Encounter: Payer: Self-pay | Admitting: Podiatry

## 2018-10-26 ENCOUNTER — Other Ambulatory Visit: Payer: Self-pay

## 2018-10-26 ENCOUNTER — Ambulatory Visit (INDEPENDENT_AMBULATORY_CARE_PROVIDER_SITE_OTHER): Payer: Medicaid Other | Admitting: Podiatry

## 2018-10-26 DIAGNOSIS — L84 Corns and callosities: Secondary | ICD-10-CM

## 2018-10-26 DIAGNOSIS — M79675 Pain in left toe(s): Secondary | ICD-10-CM

## 2018-10-26 DIAGNOSIS — G629 Polyneuropathy, unspecified: Secondary | ICD-10-CM | POA: Diagnosis not present

## 2018-10-26 DIAGNOSIS — M79674 Pain in right toe(s): Secondary | ICD-10-CM | POA: Diagnosis not present

## 2018-10-26 DIAGNOSIS — B351 Tinea unguium: Secondary | ICD-10-CM | POA: Diagnosis not present

## 2018-10-26 DIAGNOSIS — Z9229 Personal history of other drug therapy: Secondary | ICD-10-CM

## 2018-10-28 NOTE — Progress Notes (Signed)
Subjective: Stacey [name] presents to clinic with cc of painful calluses b/l and painful mycotic toenails of both feet which are aggravated when weightbearing with and without shoe gear.  This pain limits her daily activities. Pain symptoms resolve with periodic professional debridement.  [place] is her PCP.   She is on blood thinner, Plavix.  Current Outpatient Medications on File Prior to Visit  Medication Sig Dispense Refill  . ARIPiprazole (ABILIFY) 2 MG tablet Take 2 mg by mouth daily.    Marland Kitchen aspirin 325 MG tablet Take 325 mg by mouth daily.    Marland Kitchen atorvastatin (LIPITOR) 40 MG tablet Take 40 mg by mouth daily.    Marland Kitchen atorvastatin (LIPITOR) 80 MG tablet   3  . baclofen (LIORESAL) 10 MG tablet Take 10 mg by mouth 3 (three) times daily.    . benzonatate (TESSALON) 200 MG capsule TAKE ONE CAPSULE BY MOUTH THREE TIMES A DAY FOR UP TO 7 DAYS    . clopidogrel (PLAVIX) 75 MG tablet Take 75 mg by mouth daily.    . fluticasone (FLONASE) 50 MCG/ACT nasal spray Place into the nose.    . Incontinence Supply Disposable (UNDERPADS) MISC 1 CASE + 5 PACKS PEACH!    . lisdexamfetamine (VYVANSE) 30 MG capsule TK 1 C PO QAM    . medroxyPROGESTERone (DEPO-PROVERA) 150 MG/ML injection Inject into the muscle.    . metFORMIN (GLUCOPHAGE) 500 MG tablet TK 1 T PO D WITH BRE  2  . metoprolol tartrate (LOPRESSOR) 25 MG tablet Take 25 mg by mouth 2 (two) times daily.    . ramipril (ALTACE) 5 MG capsule Take 5 mg by mouth daily.    . sertraline (ZOLOFT) 100 MG tablet Take 100 mg by mouth daily.    . valACYclovir (VALTREX) 500 MG tablet 1 tab by mouth three times daily for 5 days as needed     No current facility-administered medications on file prior to visit.      Allergies  Allergen Reactions  . Metformin Diarrhea    Objective: Physical Examination:  Vascular  Examination: Capillary refill time immediate x 10 digits.  Palpable DP/PT pulses b/l.  Digital hair absent b/l.  No  edema noted b/l.  Skin temperature gradient WNL b/l.  Dermatological Examination: Skin with normal turgor, texture and tone b/l.  No open wounds b/l.  No interdigital macerations noted b/l.  Elongated, thick, discolored brittle toenails with subungual debris and pain on dorsal palpation of nailbeds 1-5 b/l.  Hyperkeratotic lesion submet head 5 left foot, submet head 2 b/l and b/l hallux with tenderness to palpation. No edema, no erythema, no drainage, no flocculence.   Musculoskeletal Examination: Muscle strength 5/5 to all muscle groups b/l.  No pain, crepitus or joint discomfort with active/passive ROM.  Neurological Examination: Sensation diminished b/l with 10 gram monofilament.  Assessment: 1. Mycotic nail infection with pain 1-5 b/l 2. Calluses submet head 5 left foot, submet head 2 b/l and b/l hallux  Plan: 1. Toenails 1-5 b/l were debrided in length and girth without iatrogenic laceration. 2. Calluses pared submet head 5 left foot, submet head 2 b/l and b/l hallux utilizing sterile scalpel blade without incident. 3. Continue soft, supportive shoe gear daily. 4. Report any pedal injuries to medical professional. 5. Follow up 3 months. 6. Patient/POA to call should there be a question/concern in there interim.

## 2019-02-01 ENCOUNTER — Ambulatory Visit (INDEPENDENT_AMBULATORY_CARE_PROVIDER_SITE_OTHER): Payer: Medicaid Other | Admitting: Podiatry

## 2019-02-01 ENCOUNTER — Other Ambulatory Visit: Payer: Self-pay

## 2019-02-01 ENCOUNTER — Encounter: Payer: Self-pay | Admitting: Podiatry

## 2019-02-01 DIAGNOSIS — M79675 Pain in left toe(s): Secondary | ICD-10-CM

## 2019-02-01 DIAGNOSIS — L84 Corns and callosities: Secondary | ICD-10-CM

## 2019-02-01 DIAGNOSIS — M79674 Pain in right toe(s): Secondary | ICD-10-CM | POA: Diagnosis not present

## 2019-02-01 DIAGNOSIS — B351 Tinea unguium: Secondary | ICD-10-CM | POA: Diagnosis not present

## 2019-02-01 DIAGNOSIS — G629 Polyneuropathy, unspecified: Secondary | ICD-10-CM | POA: Diagnosis not present

## 2019-02-01 NOTE — Patient Instructions (Signed)

## 2019-02-02 NOTE — Progress Notes (Signed)
Subjective: STAMATIA MASRI presents today for follow up of callus(es) b/l and painful mycotic toenails b/l that are difficult to trim. Pain interferes with ambulation. Aggravating factors include wearing enclosed shoe gear. Pain is relieved with periodic professional debridement..   Allergies  Allergen Reactions  . Metformin Diarrhea     Objective: There were no vitals filed for this visit.  Vascular Examination:  Capillary refill time to digits immediate b/l, palpable DP pulses b/l, palpable PT pulses b/l, pedal hair absent b/l and skin temperature gradient within normal limits b/l  Dermatological Examination: Pedal skin with normal turgor, texture and tone bilaterally, no open wounds bilaterally, no interdigital macerations bilaterally, toenails 1-5 b/l elongated, dystrophic, thickened, crumbly with subungual debris and hyperkeratotic lesion(s) submet head 5 left foot, submet head 2 b/l, and b/l hallux.  No erythema, no edema, no drainage, no flocculence  Musculoskeletal: Normal muscle strength 5/5 to all lower extremity muscle groups bilaterally, no gross bony deformities bilaterally and no pain crepitus or joint limitation noted with ROM b/l  Neurological: Protective sensation absent with 10g monofilament b/l  Assessment: 1. Pain due to onychomycosis of toenails of both feet   2. Callus   3. Neuropathy      Plan: -Medicaid ABN signed for 2021 -Toenails 1-5 b/l were debrided in length and girth without iatrogenic bleeding. -corns and calluses were debrided without complication or incident. Total number debrided =5, submet head 5 left, submet head 2 b/l and b/l hallux -Patient to continue soft, supportive shoe gear daily. -Patient to report any pedal injuries to medical professional immediately. -Patient/POA to call should there be question/concern in the interim.  Return in about 3 months (around 05/02/2019) for nail and callus trim.

## 2019-05-06 ENCOUNTER — Other Ambulatory Visit: Payer: Self-pay

## 2019-05-06 ENCOUNTER — Encounter: Payer: Self-pay | Admitting: Podiatry

## 2019-05-06 ENCOUNTER — Ambulatory Visit (INDEPENDENT_AMBULATORY_CARE_PROVIDER_SITE_OTHER): Payer: Medicaid Other | Admitting: Podiatry

## 2019-05-06 VITALS — Temp 97.4°F

## 2019-05-06 DIAGNOSIS — M79674 Pain in right toe(s): Secondary | ICD-10-CM | POA: Diagnosis not present

## 2019-05-06 DIAGNOSIS — M216X2 Other acquired deformities of left foot: Secondary | ICD-10-CM | POA: Diagnosis not present

## 2019-05-06 DIAGNOSIS — M79675 Pain in left toe(s): Secondary | ICD-10-CM

## 2019-05-06 DIAGNOSIS — B351 Tinea unguium: Secondary | ICD-10-CM

## 2019-05-06 DIAGNOSIS — L84 Corns and callosities: Secondary | ICD-10-CM

## 2019-05-06 DIAGNOSIS — G629 Polyneuropathy, unspecified: Secondary | ICD-10-CM

## 2019-05-06 NOTE — Progress Notes (Signed)
Subjective: Stacey [name] presents today painful mycotic nails b/l that are difficult to trim. Pain interferes with ambulation. Aggravating factors include wearing enclosed shoe gear. Pain is relieved with periodic professional debridement.  Medications reviewed in chart.  Allergies  Allergen Reactions  . Metformin Diarrhea    Objective: Vitals:   05/06/19 1406  Temp: (!) 97.4 F (36.3 C)    Vascular Examination: Capillary refill time to digits immediate b/l. Palpable DP pulses b/l. Palpable PT pulses b/l. Pedal hair absent b/l Skin temperature gradient within normal limits b/l. Trace edema noted b/l feet.  Dermatological Examination: Pedal skin with normal turgor, texture and tone bilaterally. No open wounds bilaterally. No interdigital macerations bilaterally. Toenails 1-5 b/l elongated, dystrophic, thickened, crumbly with subungual debris and tenderness to dorsal palpation. Porokeratotic lesion(s) L hallux, R hallux, submet head 2 left foot, submet head 2 right foot and submet head 5 left foot. No erythema, no edema, no drainage, no flocculence.  Musculoskeletal: Normal muscle strength 5/5 to all lower extremity muscle groups bilaterally. No pain crepitus or joint limitation noted with ROM b/l. Plantarflexed ray left 2nd digit with tenderness to palpation. No erythema, no edema, no drainage.  Neurological Examination: Protective sensation diminished with 10g monofilament b/l. Vibratory sensation diminished b/l.  Assessment: 1. Pain due to onychomycosis of toenails of both feet   2. Plantarflexion deformity of left foot   3. Callus   4. Neuropathy    Plan: -Toenails 1-5 b/l were debrided in length and girth with sterile nail nippers and dremel without iatrogenic bleeding.  -For plantarflexion deformity, continue shoes with shock absorption daily. -Patient to continue soft, supportive shoe gear daily. -Patient to report any pedal injuries to medical professional  immediately. -Patient/POA to call should there be question/concern in the interim.  Return in about 3 months (around 08/06/2019) for nail and callus trim.

## 2019-05-06 NOTE — Patient Instructions (Signed)
Corns and Calluses Corns are small areas of thickened skin that occur on the top, sides, or tip of a toe. They contain a cone-shaped core with a point that can press on a nerve below. This causes pain.  Calluses are areas of thickened skin that can occur anywhere on the body, including the hands, fingers, palms, soles of the feet, and heels. Calluses are usually larger than corns. What are the causes? Corns and calluses are caused by rubbing (friction) or pressure, such as from shoes that are too tight or do not fit properly. What increases the risk? Corns are more likely to develop in people who have misshapen toes (toe deformities), such as hammer toes. Calluses can occur with friction to any area of the skin. They are more likely to develop in people who:  Work with their hands.  Wear shoes that fit poorly, are too tight, or are high-heeled.  Have toe deformities. What are the signs or symptoms? Symptoms of a corn or callus include:  A hard growth on the skin.  Pain or tenderness under the skin.  Redness and swelling.  Increased discomfort while wearing tight-fitting shoes, if your feet are affected. If a corn or callus becomes infected, symptoms may include:  Redness and swelling that gets worse.  Pain.  Fluid, blood, or pus draining from the corn or callus. How is this diagnosed? Corns and calluses may be diagnosed based on your symptoms, your medical history, and a physical exam. How is this treated? Treatment for corns and calluses may include:  Removing the cause of the friction or pressure. This may involve: ? Changing your shoes. ? Wearing shoe inserts (orthotics) or other protective layers in your shoes, such as a corn pad. ? Wearing gloves.  Applying medicine to the skin (topical medicine) to help soften skin in the hardened, thickened areas.  Removing layers of dead skin with a file to reduce the size of the corn or callus.  Removing the corn or callus with a  scalpel or laser.  Taking antibiotic medicines, if your corn or callus is infected.  Having surgery, if a toe deformity is the cause. Follow these instructions at home:   Take over-the-counter and prescription medicines only as told by your health care provider.  If you were prescribed an antibiotic, take it as told by your health care provider. Do not stop taking it even if your condition starts to improve.  Wear shoes that fit well. Avoid wearing high-heeled shoes and shoes that are too tight or too loose.  Wear any padding, protective layers, gloves, or orthotics as told by your health care provider.  Soak your hands or feet and then use a file or pumice stone to soften your corn or callus. Do this as told by your health care provider.  Check your corn or callus every day for symptoms of infection. Contact a health care provider if you:  Notice that your symptoms do not improve with treatment.  Have redness or swelling that gets worse.  Notice that your corn or callus becomes painful.  Have fluid, blood, or pus coming from your corn or callus.  Have new symptoms. Summary  Corns are small areas of thickened skin that occur on the top, sides, or tip of a toe.  Calluses are areas of thickened skin that can occur anywhere on the body, including the hands, fingers, palms, and soles of the feet. Calluses are usually larger than corns.  Corns and calluses are caused by   rubbing (friction) or pressure, such as from shoes that are too tight or do not fit properly.  Treatment may include wearing any padding, protective layers, gloves, or orthotics as told by your health care provider. This information is not intended to replace advice given to you by your health care provider. Make sure you discuss any questions you have with your health care provider. Document Revised: 04/11/2018 Document Reviewed: 11/02/2016 Elsevier Patient Education  2020 [employer]  Onychomycosis/Fungal  Toenails  WHAT IS IT? An infection that lies within the keratin of your nail plate that is caused by a fungus.  WHY ME? Fungal infections affect all ages, sexes, races, and creeds.  There may be many factors that predispose you to a fungal infection such as age, coexisting medical conditions such as diabetes, or an autoimmune disease; stress, medications, fatigue, genetics, etc.  Bottom line: fungus thrives in a warm, moist environment and your shoes offer such a location.  IS IT CONTAGIOUS? Theoretically, yes.  You do not want to share shoes, nail clippers or files with someone who has fungal toenails.  Walking around barefoot in the same room or sleeping in the same bed is unlikely to transfer the organism.  It is important to realize, however, that fungus can spread easily from one nail to the next on the same foot.  HOW DO WE TREAT THIS?  There are several ways to treat this condition.  Treatment may depend on many factors such as age, medications, pregnancy, liver and kidney conditions, etc.  It is best to ask your doctor which options are available to you.  4. No treatment.   Unlike many other medical concerns, you can live with this condition.  However for many people this can be a painful condition and may lead to ingrown toenails or a bacterial infection.  It is recommended that you keep the nails cut short to help reduce the amount of fungal nail. 5. Topical treatment.  These range from herbal remedies to prescription strength nail lacquers.  About 40-50% effective, topicals require twice daily application for approximately 9 to 12 months or until an entirely new nail has grown out.  The most effective topicals are medical grade medications available through physicians offices. 6. Oral antifungal medications.  With an 80-90% cure rate, the most common oral medication requires 3 to 4 months of therapy and stays in your system for a year as the new nail grows out.  Oral antifungal medications do  require blood work to make sure it is a safe drug for you.  A liver function panel will be performed prior to starting the medication and after the first month of treatment.  It is important to have the blood work performed to avoid any harmful side effects.  In general, this medication safe but blood work is required. 7. Laser Therapy.  This treatment is performed by applying a specialized laser to the affected nail plate.  This therapy is noninvasive, fast, and non-painful.  It is not covered by insurance and is therefore, out of pocket.  The results have been very good with a 80-95% cure rate.  [facility] is the only practice in the area to offer this therapy. 8. Permanent Nail Avulsion.  Removing the entire nail so that a new nail will not grow back. 

## 2019-08-05 ENCOUNTER — Other Ambulatory Visit: Payer: Self-pay

## 2019-08-05 ENCOUNTER — Ambulatory Visit: Payer: Medicaid Other | Admitting: Podiatry

## 2019-08-05 ENCOUNTER — Encounter: Payer: Self-pay | Admitting: Podiatry

## 2019-08-05 DIAGNOSIS — B351 Tinea unguium: Secondary | ICD-10-CM

## 2019-08-05 DIAGNOSIS — M79674 Pain in right toe(s): Secondary | ICD-10-CM | POA: Diagnosis not present

## 2019-08-05 DIAGNOSIS — M79675 Pain in left toe(s): Secondary | ICD-10-CM | POA: Diagnosis not present

## 2019-08-05 DIAGNOSIS — Q828 Other specified congenital malformations of skin: Secondary | ICD-10-CM

## 2019-08-05 DIAGNOSIS — G629 Polyneuropathy, unspecified: Secondary | ICD-10-CM

## 2019-08-06 NOTE — Progress Notes (Signed)
Subjective: Stacey [name] presents today painful callus(es) bilaterally and painful thick toenails that are difficult to trim. Painful toenails interfere with ambulation. Aggravating factors include wearing enclosed shoe gear. Pain is relieved with periodic professional debridement. Painful calluses are aggravated when weightbearing with and without shoegear. Pain is relieved with periodic professional debridement..  She presents to the office with her mother on today's visit.  They voiced no new pedal problems.  Medications reviewed in chart.  Allergies  Allergen Reactions  . Metformin Diarrhea    Objective: There were no vitals filed for this visit.  Vascular Examination: Capillary refill time to digits immediate b/l. Palpable DP pulses b/l. Palpable PT pulses b/l. Pedal hair absent b/l Skin temperature gradient within normal limits b/l. Trace edema noted b/l feet.  Dermatological Examination: Pedal skin with normal turgor, texture and tone bilaterally. No open wounds bilaterally. No interdigital macerations bilaterally. Toenails 1-5 b/l elongated, dystrophic, thickened, crumbly with subungual debris and tenderness to dorsal palpation. Porokeratotic lesion(s) L hallux, R hallux, submet head 2 left foot, submet head 2 right foot and submet head 5 left foot. No erythema, no edema, no drainage, no flocculence.  Musculoskeletal: Normal muscle strength 5/5 to all lower extremity muscle groups bilaterally. No pain crepitus or joint limitation noted with ROM b/l. Plantarflexed ray left 2nd digit with tenderness to palpation. No erythema, no edema, no drainage.  Neurological Examination: Protective sensation diminished with 10g monofilament b/l. Vibratory sensation diminished b/l.  Assessment: 1. Pain due to onychomycosis of toenails of both feet   2. Porokeratosis   3. Neuropathy     Plan: -For multiple porkeratoses, Medicaid ABN for 2021 is signed and scanned into patient's  chart. -Toenails 1-5 b/l were debrided in length and girth with sterile nail nippers and dremel without iatrogenic bleeding.  -Porokeratotic lesions left hallux, right hallux, submet head to left foot, submet head 2 right foot, and submet head 5 left foot were pared and enucleated with sterile scalpel blade without incident.  Total number of lesions equals 5. -For plantarflexion deformity, continue shoes with shock absorption daily. -Patient to continue soft, supportive shoe gear daily. -Patient to report any pedal injuries to medical professional immediately. -Patient/POA to call should there be question/concern in the interim.  Return in about 3 months (around 11/05/2019) for nail and callus trim.

## 2019-11-05 ENCOUNTER — Encounter: Payer: Self-pay | Admitting: Podiatry

## 2019-11-05 ENCOUNTER — Ambulatory Visit (INDEPENDENT_AMBULATORY_CARE_PROVIDER_SITE_OTHER): Payer: Medicaid Other | Admitting: Podiatry

## 2019-11-05 ENCOUNTER — Other Ambulatory Visit: Payer: Self-pay

## 2019-11-05 DIAGNOSIS — M79675 Pain in left toe(s): Secondary | ICD-10-CM

## 2019-11-05 DIAGNOSIS — M79674 Pain in right toe(s): Secondary | ICD-10-CM | POA: Diagnosis not present

## 2019-11-05 DIAGNOSIS — B351 Tinea unguium: Secondary | ICD-10-CM | POA: Diagnosis not present

## 2019-11-05 DIAGNOSIS — M79671 Pain in right foot: Secondary | ICD-10-CM | POA: Diagnosis not present

## 2019-11-05 DIAGNOSIS — M79672 Pain in left foot: Secondary | ICD-10-CM | POA: Diagnosis not present

## 2019-11-05 DIAGNOSIS — Q828 Other specified congenital malformations of skin: Secondary | ICD-10-CM

## 2019-11-09 NOTE — Progress Notes (Addendum)
Subjective: Stacey [name] presents today painful callus(es) bilaterally and painful thick toenails that are difficult to trim. Painful toenails interfere with ambulation. Aggravating factors include wearing enclosed shoe gear. Pain is relieved with periodic professional debridement. Painful calluses are aggravated when weightbearing with and without shoegear. Pain is relieved with periodic professional debridement..  She presents to the office with her mother on today's visit.  They voiced no new pedal problems.  Medications reviewed in chart.  Allergies  Allergen Reactions  . Metformin Diarrhea    Objective: There were no vitals filed for this visit.  Vascular Examination: Capillary refill time to digits immediate b/l. Palpable DP pulses b/l. Palpable PT pulses b/l. Pedal hair absent b/l Skin temperature gradient within normal limits b/l. Trace edema noted b/l feet.  Dermatological Examination: Pedal skin with normal turgor, texture and tone bilaterally. No open wounds bilaterally. No interdigital macerations bilaterally. Toenails 1-5 b/l elongated, discolored, dystrophic, thickened, crumbly with subungual debris and tenderness to dorsal palpation. Porokeratotic lesion(s) L hallux, R hallux, submet head 2 left foot and submet head 5 right foot. No erythema, no edema, no drainage, no fluctuance.  Musculoskeletal: Normal muscle strength 5/5 to all lower extremity muscle groups bilaterally. No pain crepitus or joint limitation noted with ROM b/l. Plantarflexed ray left 2nd digit with tenderness to palpation. No erythema, no edema, no drainage.  Neurological Examination: Protective sensation diminished with 10g monofilament b/l. Vibratory sensation diminished b/l.  Assessment: 1. Pain due to onychomycosis of toenails of both feet   2. Porokeratosis   3. Pain in both feet     Plan: -For multiple porkeratoses, Medicaid ABN for 2021 is signed and scanned into patient's  chart. -Toenails 1-5 b/l were debrided in length and girth with sterile nail nippers and dremel without iatrogenic bleeding.  -Porokeratotic lesions left hallux, right hallux,  submet head 2 left foot, and submet head 5 right foot were pared and enucleated with sterile scalpel blade without incident.  Total number of lesions equals 4. -For plantarflexion deformity, continue shoes with shock absorption daily. -Patient to continue soft, supportive shoe gear daily. -Patient to report any pedal injuries to medical professional immediately. -Patient/POA to call should there be question/concern in the interim.  Return in about 3 months (around 02/05/2020).

## 2020-02-18 ENCOUNTER — Ambulatory Visit: Payer: Medicaid Other | Admitting: Podiatry

## 2020-02-18 ENCOUNTER — Encounter: Payer: Self-pay | Admitting: Podiatry

## 2020-02-18 ENCOUNTER — Ambulatory Visit (INDEPENDENT_AMBULATORY_CARE_PROVIDER_SITE_OTHER): Payer: Medicaid Other | Admitting: Podiatry

## 2020-02-18 ENCOUNTER — Other Ambulatory Visit: Payer: Self-pay

## 2020-02-18 DIAGNOSIS — B351 Tinea unguium: Secondary | ICD-10-CM

## 2020-02-18 DIAGNOSIS — G629 Polyneuropathy, unspecified: Secondary | ICD-10-CM

## 2020-02-18 DIAGNOSIS — M79675 Pain in left toe(s): Secondary | ICD-10-CM | POA: Diagnosis not present

## 2020-02-18 DIAGNOSIS — L84 Corns and callosities: Secondary | ICD-10-CM

## 2020-02-18 DIAGNOSIS — M79674 Pain in right toe(s): Secondary | ICD-10-CM | POA: Diagnosis not present

## 2020-02-18 DIAGNOSIS — M216X2 Other acquired deformities of left foot: Secondary | ICD-10-CM

## 2020-02-18 DIAGNOSIS — M79671 Pain in right foot: Secondary | ICD-10-CM

## 2020-02-18 DIAGNOSIS — M79672 Pain in left foot: Secondary | ICD-10-CM

## 2020-02-18 DIAGNOSIS — R7303 Prediabetes: Secondary | ICD-10-CM

## 2020-02-18 NOTE — Progress Notes (Signed)
  Subjective: BRIEL GALLICCHIO presents today painful callus(es) bilaterally and painful thick toenails that are difficult to trim. Painful toenails interfere with ambulation. Aggravating factors include wearing enclosed shoe gear. Pain is relieved with periodic professional debridement. Painful calluses are aggravated when weightbearing with and without shoegear. Pain is relieved with periodic professional debridement.  She presents to the office with her mother on today's visit.  They voiced no new pedal problems.  Allergies  Allergen Reactions  . Metformin Diarrhea   Objective: There were no vitals filed for this visit.  Vascular Examination: Capillary refill time to digits immediate b/l. Palpable DP pulses b/l. Palpable PT pulses b/l. Pedal hair absent b/l Skin temperature gradient within normal limits b/l. Trace edema noted b/l feet.  Dermatological Examination: Pedal skin with normal turgor, texture and tone bilaterally. No open wounds bilaterally. No interdigital macerations bilaterally. Toenails 1-5 b/l elongated, discolored, dystrophic, thickened, crumbly with subungual debris and tenderness to dorsal palpation. Porokeratotic lesion(s) L hallux, R hallux, submet head 2 left foot and submet head 5 right foot. No erythema, no edema, no drainage, no fluctuance.  Musculoskeletal: Normal muscle strength 5/5 to all lower extremity muscle groups bilaterally. No pain crepitus or joint limitation noted with ROM b/l. Plantarflexed ray left 2nd digit with tenderness to palpation. No erythema, no edema, no drainage.  Neurological Examination: Protective sensation diminished with 10g monofilament b/l. Vibratory sensation diminished b/l.  Assessment: 1. Pain due to onychomycosis of toenails of both feet   2. Callus   3. Pain in both feet   4. Plantarflexion deformity of left foot   5. Neuropathy   6. Prediabetes     Plan: -Patient examined today. -Toenails 1-5 b/l were debrided in  length and girth with sterile nail nippers and dremel without iatrogenic bleeding.  -As a courtesy, porokeratotic lesions left hallux, right hallux,  submet head 2 left foot, and submet head 5 right foot were pared and enucleated with sterile scalpel blade without incident.  Total number of lesions equals 4. -For plantarflexion deformity, continue shoes with shock absorption daily. -Patient to continue soft, supportive shoe gear daily. -Patient to report any pedal injuries to medical professional immediately. -Patient/POA to call should there be question/concern in the interim.  Return in about 3 months (around 05/17/2020).

## 2020-05-29 ENCOUNTER — Encounter: Payer: Self-pay | Admitting: Podiatry

## 2020-05-29 ENCOUNTER — Other Ambulatory Visit: Payer: Self-pay

## 2020-05-29 ENCOUNTER — Ambulatory Visit (INDEPENDENT_AMBULATORY_CARE_PROVIDER_SITE_OTHER): Payer: Medicaid Other | Admitting: Podiatry

## 2020-05-29 DIAGNOSIS — M79674 Pain in right toe(s): Secondary | ICD-10-CM

## 2020-05-29 DIAGNOSIS — Q828 Other specified congenital malformations of skin: Secondary | ICD-10-CM

## 2020-05-29 DIAGNOSIS — B351 Tinea unguium: Secondary | ICD-10-CM | POA: Diagnosis not present

## 2020-05-29 DIAGNOSIS — M79675 Pain in left toe(s): Secondary | ICD-10-CM

## 2020-05-29 DIAGNOSIS — G629 Polyneuropathy, unspecified: Secondary | ICD-10-CM

## 2020-06-01 NOTE — Progress Notes (Signed)
Subjective: Stacey [name] is a pleasant 35 y.o. female patient seen today for painful calluses b/l feet and painful thick toenails that are difficult to trim. Pain interferes with ambulation. Aggravating factors include wearing enclosed shoe gear. Pain is relieved with periodic professional debridement.  She is accompanied by her mother, Stacey [name], on today's visit. Stacey [name] voices no new pedal concerns on today's visit.  PCP is [facility], [facility].   Allergies  Allergen Reactions  . Metformin Diarrhea    Objective: Physical Exam  General: Stacey [name] is a pleasant 35 y.o. African American female, in NAD. AAO x 3.   Vascular:  Capillary refill time to digits immediate b/l. Palpable pedal pulses b/l LE. Pedal hair absent. Lower extremity skin temperature gradient within normal limits. No pain with calf compression b/l. Trace edema noted b/l lower extremities.  Dermatological:  Pedal skin with normal turgor, texture and tone bilaterally. No open wounds bilaterally. No interdigital macerations bilaterally. Toenails 1-5 b/l elongated, discolored, dystrophic, thickened, crumbly with subungual debris and tenderness to dorsal palpation. Porokeratotic lesion(s) L hallux, R hallux, submet head 2 left foot and submet head 5 left foot. No erythema, no edema, no drainage, no fluctuance.  Musculoskeletal:  Normal muscle strength 5/5 to all lower extremity muscle groups bilaterally. No pain crepitus or joint limitation noted with ROM b/l. Plantarflexed metatarsal(s) 2nd metatarsal head left foot.  Neurological:  Protective sensation diminished with 10g monofilament b/l. Vibratory sensation diminished b/l.  Assessment and Plan:  1. Pain due to onychomycosis of toenails of both feet   2. Porokeratosis   3. Neuropathy     -Examined patient. -Medicaid ABN signed for this year. Patient consents for services of paring of porokeratotic lesions today. Patient/POA has copy and  copy has been placed in patient chart. -Patient to continue soft, supportive shoe gear daily. -Toenails 1-5 b/l were debrided in length and girth with sterile nail nippers and dremel without iatrogenic bleeding.  -Painful porokeratotic lesion(s) L hallux, R hallux, submet head 2 left foot and submet head 5 left foot pared and enucleated with sterile scalpel blade without incident. Total number of lesions debrided=4. -Patient to report any pedal injuries to medical professional immediately. -Patient/POA to call should there be question/concern in the interim.  Return in about 3 months (around 08/29/2020).  Freddie Breech, DPM

## 2020-08-31 ENCOUNTER — Encounter: Payer: Self-pay | Admitting: Podiatry

## 2020-08-31 ENCOUNTER — Ambulatory Visit (INDEPENDENT_AMBULATORY_CARE_PROVIDER_SITE_OTHER): Payer: Medicaid Other | Admitting: Podiatry

## 2020-08-31 ENCOUNTER — Other Ambulatory Visit: Payer: Self-pay

## 2020-08-31 DIAGNOSIS — M79675 Pain in left toe(s): Secondary | ICD-10-CM

## 2020-08-31 DIAGNOSIS — M79671 Pain in right foot: Secondary | ICD-10-CM | POA: Diagnosis not present

## 2020-08-31 DIAGNOSIS — Q828 Other specified congenital malformations of skin: Secondary | ICD-10-CM

## 2020-08-31 DIAGNOSIS — M79674 Pain in right toe(s): Secondary | ICD-10-CM | POA: Diagnosis not present

## 2020-08-31 DIAGNOSIS — M79672 Pain in left foot: Secondary | ICD-10-CM | POA: Diagnosis not present

## 2020-08-31 DIAGNOSIS — G629 Polyneuropathy, unspecified: Secondary | ICD-10-CM

## 2020-08-31 DIAGNOSIS — B351 Tinea unguium: Secondary | ICD-10-CM | POA: Diagnosis not present

## 2020-09-02 ENCOUNTER — Emergency Department (HOSPITAL_BASED_OUTPATIENT_CLINIC_OR_DEPARTMENT_OTHER)
Admission: EM | Admit: 2020-09-02 | Discharge: 2020-09-02 | Disposition: A | Discharge status: Home / Self Care | Payer: Medicaid Other | Attending: Emergency Medicine | Admitting: Emergency Medicine

## 2020-09-02 ENCOUNTER — Emergency Department (HOSPITAL_BASED_OUTPATIENT_CLINIC_OR_DEPARTMENT_OTHER): Payer: Medicaid Other

## 2020-09-02 ENCOUNTER — Other Ambulatory Visit: Payer: Self-pay

## 2020-09-02 ENCOUNTER — Encounter (HOSPITAL_BASED_OUTPATIENT_CLINIC_OR_DEPARTMENT_OTHER): Payer: Self-pay

## 2020-09-02 DIAGNOSIS — R0789 Other chest pain: Secondary | ICD-10-CM | POA: Diagnosis not present

## 2020-09-02 DIAGNOSIS — I129 Hypertensive chronic kidney disease with stage 1 through stage 4 chronic kidney disease, or unspecified chronic kidney disease: Secondary | ICD-10-CM | POA: Diagnosis not present

## 2020-09-02 DIAGNOSIS — Z7901 Long term (current) use of anticoagulants: Secondary | ICD-10-CM | POA: Insufficient documentation

## 2020-09-02 DIAGNOSIS — M25531 Pain in right wrist: Secondary | ICD-10-CM | POA: Insufficient documentation

## 2020-09-02 DIAGNOSIS — W19XXXA Unspecified fall, initial encounter: Secondary | ICD-10-CM | POA: Diagnosis not present

## 2020-09-02 DIAGNOSIS — M25561 Pain in right knee: Secondary | ICD-10-CM | POA: Insufficient documentation

## 2020-09-02 DIAGNOSIS — Z7982 Long term (current) use of aspirin: Secondary | ICD-10-CM | POA: Insufficient documentation

## 2020-09-02 DIAGNOSIS — Y92009 Unspecified place in unspecified non-institutional (private) residence as the place of occurrence of the external cause: Secondary | ICD-10-CM | POA: Diagnosis not present

## 2020-09-02 DIAGNOSIS — N182 Chronic kidney disease, stage 2 (mild): Secondary | ICD-10-CM | POA: Diagnosis not present

## 2020-09-02 DIAGNOSIS — Z79899 Other long term (current) drug therapy: Secondary | ICD-10-CM | POA: Diagnosis not present

## 2020-09-02 NOTE — Progress Notes (Signed)
Subjective: Stacey [name] is a pleasant 35 y.o. female patient seen today for painful calluses b/l feet and painful thick toenails that are difficult to trim. Pain interferes with ambulation. Aggravating factors include wearing enclosed shoe gear. Pain is relieved with periodic professional debridement.  She is accompanied by her mother, Stacey [name], on today's visit. Stacey [name] voices no new pedal concerns on today's visit.  PCP is [facility], [facility]. Last visit was 4 months ago.  Allergies  Allergen Reactions   Metformin Diarrhea    Objective: Physical Exam  General: Stacey [name] is a pleasant 35 y.o. African American female, in NAD. AAO x 3.   Vascular:  Capillary refill time to digits immediate b/l. Palpable pedal pulses b/l LE. Pedal hair absent. Lower extremity skin temperature gradient within normal limits. No pain with calf compression b/l. Trace edema noted b/l lower extremities.  Dermatological:  Pedal skin with normal turgor, texture and tone bilaterally. No open wounds bilaterally. No interdigital macerations bilaterally. Toenails 1-5 b/l elongated, discolored, dystrophic, thickened, crumbly with subungual debris and tenderness to dorsal palpation. Porokeratotic lesion(s) L hallux, R hallux, submet head 2 left foot and submet head 5 left foot. No erythema, no edema, no drainage, no fluctuance.  Musculoskeletal:  Normal muscle strength 5/5 to all lower extremity muscle groups bilaterally. No pain crepitus or joint limitation noted with ROM b/l. Plantarflexed metatarsal(s) 2nd metatarsal head left foot.  Neurological:  Protective sensation diminished with 10g monofilament b/l. Vibratory sensation diminished b/l.  Assessment and Plan:  1. Pain due to onychomycosis of toenails of both feet   2. Porokeratosis   3. Pain in both feet   4. Neuropathy      -Examined patient. -No new findings. No new orders. -Medicaid ABN signed for this year. Patient  consents for services of paring of porokeratotic lesions today. Copy has been placed in patient chart. -Patient to continue soft, supportive shoe gear daily. -Toenails 1-5 b/l were debrided in length and girth with sterile nail nippers and dremel without iatrogenic bleeding.  -Painful porokeratotic lesion(s) L hallux, R hallux, submet head 2 left foot, and submet head 5 left foot pared and enucleated with sterile scalpel blade without incident. Total number of lesions debrided=4. -Patient to report any pedal injuries to medical professional immediately. -Patient/POA to call should there be question/concern in the interim.  Return in about 3 months (around 12/01/2020).  Stacey [name], DPM

## 2020-09-02 NOTE — ED Triage Notes (Addendum)
Pt to ED via [facility] stretcher to w/c-EMS report pt fell in her home ~730pm-pain to right knee and right wrist-pt was ambulatory with shuffling gait-no head/neck injury-pt does not take blood thinners-VSS-pt agrees with report-also states having pain to right flank/rib area-mother states pt hit a table-does take ASA per day-NAD-to triage in w/c

## 2020-09-02 NOTE — Discharge Instructions (Addendum)
Please return for difficulty breathing or fever or if you feel like you are unable to take deep breaths.  Take Tylenol and/or ibuprofen or naproxen for discomfort.

## 2020-09-03 NOTE — ED Provider Notes (Signed)
[facility] EMERGENCY DEPARTMENT Provider Note   CSN: [id number] Arrival date & time: 09/02/20  2012     History Chief Complaint  Patient presents with   Stacey [name]    Stacey JacksonLatasha R [name] is a 35 y.o. female.  35 yo F with a chief complaints of a fall.  Patient states that something got in her way, sounds like per family she neglected to use her cane that she typically needs secondary to chronic right-sided weakness.  Patient had a fall onto her right side.  Complaining of right wrist right chest wall and right knee pain.  Denies head injury or loss consciousness denies neck pain back pain shortness of breath abdominal pain.  The history is provided by the patient.  Fall This is a new problem. The current episode started 6 to 12 hours ago. The problem occurs constantly. The problem has not changed since onset.Pertinent negatives include no chest pain, no headaches and no shortness of breath. The symptoms are aggravated by bending and twisting. Nothing relieves the symptoms. She has tried nothing for the symptoms. The treatment provided no relief.      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 No pertinent surgical history.   OB History   No obstetric history on file.     No family history on file.  Social History   Tobacco Use   Smoking status: Never   Smokeless tobacco: Never  Vaping Use   Vaping Use: Never used  Substance Use Topics   Alcohol use: No   Drug use: No    Home Medications Prior to Admission medications   Medication Sig Start Date End Date Taking? Authorizing Provider  ARIPiprazole (ABILIFY) 10 MG tablet Take 10 mg by mouth daily. 11/18/19   [provider]  Ascorbic Acid 500 MG/5ML SYRP Take by mouth.  03/05/09   [provider]  aspirin 325 MG tablet Take 325 mg by mouth daily.     [provider]  atorvastatin (LIPITOR) 80 MG tablet Take by mouth. 05/15/20   [provider]  baclofen (LIORESAL) 10 MG tablet Take 10 mg by mouth 3 (three) times daily.     [provider]  benzonatate (TESSALON) 200 MG capsule TAKE ONE CAPSULE BY MOUTH THREE TIMES A DAY FOR UP TO 7 DAYS 03/28/18   [provider]  calcium-vitamin D (OSCAL WITH D) 500-200 MG-UNIT TABS tablet Take by mouth.  04/18/09   [provider]  carbamazepine (TEGRETOL XR) 100 MG 12 hr tablet Take by mouth.  04/18/09   [provider]  clopidogrel (PLAVIX) 75 MG tablet Take 75 mg by  mouth daily.     [provider]  clotrimazole (LOTRIMIN) 1 % cream Apply topically. 03/27/20 03/27/21  [provider]  Docusate Sodium (DSS) 100 MG CAPS Take by mouth.  03/05/09   [provider]  DULoxetine (CYMBALTA) 20 MG capsule Take by mouth. Patient not taking: Reported on 11/05/2019 04/18/09   [provider]  esomeprazole (NEXIUM) 40 MG capsule Take by mouth.  03/05/09   [provider]  ferrous sulfate 300 (60 Fe) MG/5ML syrup Take by mouth.  03/05/09   [provider]  fluticasone (FLONASE) 50 MCG/ACT  nasal spray Place into the nose. 11/15/19 11/14/20  [provider]  Incontinence Supply Disposable (UNDERPADS) MISC 1 CASE + 5 PACKS PEACH! 06/07/13   [provider]  lisdexamfetamine (VYVANSE) 30 MG capsule TK 1 C PO QAM 09/13/16   [provider]  medroxyPROGESTERone (DEPO-PROVERA) 150 MG/ML injection Inject into the muscle.  10/20/16 03/28/20  [provider]  medroxyPROGESTERone Acetate 150 MG/ML SUSY Inject into the muscle.  09/10/19   [provider]  metFORMIN (GLUCOPHAGE) 500 MG tablet TK 1 T PO D WITH BRE 04/01/17   [provider]  metoprolol tartrate (LOPRESSOR) 25 MG tablet Take by mouth. 05/15/20   [provider]  ondansetron (ZOFRAN-ODT) 4 MG disintegrating tablet Take by mouth.  03/05/09   [provider]  Oxycodone HCl 10 MG TABS Take by mouth.  04/18/09   [provider]  ramipril (ALTACE) 5 MG capsule Take 5 mg by mouth daily.     [provider]  sertraline (ZOLOFT) 100 MG tablet Take 100 mg by mouth daily.     [provider]  Skin Protectants, Misc. (DERMACERIN) CREA Apply topically.  04/18/09   [provider]  valACYclovir (VALTREX) 500 MG tablet 1 tab by mouth three times daily for 5 days as needed 01/07/11   [provider]  warfarin (COUMADIN) 1 MG tablet Take by mouth.  04/18/09   [provider]    Allergies    Metformin  Review of Systems   Review of Systems  Constitutional:  Negative for chills and fever.  HENT:  Negative for congestion and rhinorrhea.   Eyes:  Negative for redness and visual disturbance.  Respiratory:  Negative for shortness of breath and wheezing.   Cardiovascular:  Negative for chest pain and palpitations.  Gastrointestinal:  Negative for nausea and vomiting.  Genitourinary:  Negative for dysuria and urgency.  Musculoskeletal:  Positive for arthralgias and myalgias.  Skin:  Negative for pallor and wound.  Neurological:   Negative for dizziness and headaches.   Physical Exam Updated Vital Signs BP 134/88   Pulse 86   Temp 98.8 F (37.1 C) (Oral)   Resp 17   Ht 5\' 6"  (1.676 m)   Wt 77.1 kg   SpO2 100%   BMI 27.44 kg/m   Physical Exam Vitals and nursing note reviewed.  Constitutional:      General: She is not in acute distress.    Appearance: She is well-developed. She is not diaphoretic.  HENT:     Head: Normocephalic and atraumatic.  Eyes:     Pupils: Pupils are equal, round, and reactive to light.  Cardiovascular:     Rate and Rhythm: Normal rate and regular rhythm.     Heart sounds: No murmur heard.   No friction rub. No gallop.  Pulmonary:     Effort: Pulmonary effort is normal.     Breath sounds: No  wheezing or rales.  Abdominal:     General: There is no distension.     Palpations: Abdomen is soft.     Tenderness: There is no abdominal tenderness.  Musculoskeletal:        General: Tenderness present. No deformity.     Cervical back: Normal range of motion and neck supple.     Comments: Mild pain to the right wrist, right knee.  No obvious signs of trauma.    Skin:    General: Skin is warm and dry.  Neurological:     Mental Status: She is alert and oriented to person, place, and time.  Psychiatric:        Behavior: Behavior normal.    ED Results / Procedures / Treatments   Labs (all labs ordered are listed, but only abnormal results are displayed) Labs Reviewed - No data to display  EKG None  Radiology DG Ribs Unilateral W/Chest Right  Result Date: 09/02/2020 CLINICAL DATA:  Fall at home today. Right rib and chest pain. Initial encounter. EXAM: RIGHT RIBS AND CHEST - 3+ VIEW COMPARISON:  Chest radiograph on 11/28/2010 FINDINGS: No fracture or other bone lesions are seen involving the ribs. There is no evidence of pneumothorax or pleural effusion. Both lungs are clear. Heart size and mediastinal contours are within normal limits. IMPRESSION: Negative. Electronically Signed    By: Danae Orleans M.D.   On: 09/02/2020 21:33   DG Wrist Complete Right  Result Date: 09/02/2020 CLINICAL DATA:  Fall at home. Right wrist injury and pain. Initial encounter. EXAM: RIGHT WRIST - COMPLETE 3+ VIEW COMPARISON:  None. FINDINGS: There is no evidence of fracture or dislocation. There is no evidence of arthropathy or other focal bone abnormality. Soft tissues are unremarkable. IMPRESSION: Negative. Electronically Signed   By: Danae Orleans M.D.   On: 09/02/2020 21:32   DG Knee Complete 4 Views Right  Result Date: 09/02/2020 CLINICAL DATA:  Fall at home today. Right knee pain. Initial encounter. EXAM: RIGHT KNEE - COMPLETE 4+ VIEW COMPARISON:  None. FINDINGS: No evidence of fracture, dislocation, or joint effusion. No evidence of arthropathy or other focal bone abnormality. Soft tissues are unremarkable. IMPRESSION: Negative. Electronically Signed   By: Danae Orleans M.D.   On: 09/02/2020 21:32    Procedures Procedures   Medications Ordered in ED Medications - No data to display  ED Course  I have reviewed the triage vital signs and the nursing notes.  Pertinent labs & imaging results that were available during my care of the patient were reviewed by me and considered in my medical decision making (see chart for details).    MDM Rules/Calculators/A&P                           35 yo F with a cc of a fall.  Non syncopal by history.  No obvious signs of trauma.  Plain films viewed by me without fx.    The patients results and plan were reviewed and discussed.   Any x-rays performed were independently reviewed by myself.   Differential diagnosis were considered with the presenting HPI.  Medications - No data to display  Vitals:   09/02/20 2130 09/02/20 2200 09/02/20 2230 09/02/20 2306  BP: 130/84 112/77 122/83 134/88  Pulse: 81 85 86   Resp:   17   Temp:      TempSrc:      SpO2: 100% 100% 100%   Weight:  Height:        Final diagnoses:  Fall, initial encounter   Right wrist pain  Acute pain of right knee  Right-sided chest wall pain    Admission/ observation were discussed with the admitting physician, patient and/or family and they are comfortable with the plan.   Final Clinical Impression(s) / ED Diagnoses Final diagnoses:  Fall, initial encounter  Right wrist pain  Acute pain of right knee  Right-sided chest wall pain    Rx / DC Orders ED Discharge Orders     None        Melene Plan, DO 09/03/20 1503

## 2020-12-11 ENCOUNTER — Other Ambulatory Visit: Payer: Self-pay

## 2020-12-11 ENCOUNTER — Ambulatory Visit (INDEPENDENT_AMBULATORY_CARE_PROVIDER_SITE_OTHER): Payer: Medicaid Other | Admitting: Podiatry

## 2020-12-11 ENCOUNTER — Encounter: Payer: Self-pay | Admitting: Podiatry

## 2020-12-11 DIAGNOSIS — M79671 Pain in right foot: Secondary | ICD-10-CM

## 2020-12-11 DIAGNOSIS — M79674 Pain in right toe(s): Secondary | ICD-10-CM

## 2020-12-11 DIAGNOSIS — B351 Tinea unguium: Secondary | ICD-10-CM

## 2020-12-11 DIAGNOSIS — Q828 Other specified congenital malformations of skin: Secondary | ICD-10-CM | POA: Diagnosis not present

## 2020-12-11 DIAGNOSIS — M79675 Pain in left toe(s): Secondary | ICD-10-CM

## 2020-12-11 DIAGNOSIS — M79672 Pain in left foot: Secondary | ICD-10-CM

## 2020-12-17 NOTE — Progress Notes (Signed)
°  Subjective:  Patient ID: Stacey [name], female    DOB: 12-Jan-1985,  MRN: [id number]  Stacey [name] presents to clinic today for painful porokeratotic lesion(s) bilaterally and painful mycotic toenails that limit ambulation. Painful toenails interfere with ambulation. Aggravating factors include wearing enclosed shoe gear. Pain is relieved with periodic professional debridement. Painful porokeratotic lesions are aggravated when weightbearing with and without shoegear. Pain is relieved with periodic professional debridement.  Patient is accompanied by her Mother on today's visit. They voice no new pedal problems on today's visit.  [facility] , and last visit was 11/20/2020.  Allergies  Allergen Reactions   Metformin Diarrhea    Review of Systems: Negative except as noted in the HPI. Objective:   Constitutional Stacey [name] is a pleasant 35 y.o. African American female, in NAD. AAO x 3.   Vascular CFT immediate b/l LE. Palpable DP/PT pulses b/l LE. Digital hair absent b/l. Skin temperature gradient WNL b/l. No pain with calf compression b/l. Trace edema noted b/l. No cyanosis or clubbing noted b/l LE.  Neurologic Normal speech. Oriented to person, place, and time. Protective sensation diminished with 10g monofilament b/l.  Dermatologic Pedal integument with normal turgor, texture and tone b/l LE. No open wounds b/l. No interdigital macerations b/l. Toenails 1-5 b/l elongated, thickened, discolored with subungual debris. +Tenderness with dorsal palpation of nailplates. Porokeratotic lesion(s) noted bilateral great toes, submet head 2 left foot, and submet head 5 left foot.  Orthopedic: Muscle strength 5/5 to all LE muscle groups of left lower extremity. Muscle strength 4/5to all LE muscle groups of right lower extremity. Utilizes cane for ambulation assistance.   Radiographs: None   Assessment:   1. Pain due to onychomycosis of toenails of both feet    2. Porokeratosis   3. Pain in both feet    Plan:  Patient was evaluated and treated and all questions answered. Consent given for treatment as described below: -Examined patient. -Medicaid ABN on file for paring of lesions. -Mycotic toenails 1-5 bilaterally were debrided in length and girth with sterile nail nippers and dremel without incident. -Painful porokeratotic lesion(s) bilateral great toes, submet head 2 left foot, and submet head 5 left foot pared and enucleated with sterile scalpel blade without incident. Total number of lesions debrided=4. -Patient/POA to call should there be question/concern in the interim.  Return in about 3 months (around 03/11/2021).  Freddie Breech, DPM

## 2021-02-03 ENCOUNTER — Other Ambulatory Visit: Payer: Self-pay

## 2021-02-03 ENCOUNTER — Ambulatory Visit (INDEPENDENT_AMBULATORY_CARE_PROVIDER_SITE_OTHER): Payer: Medicaid Other | Admitting: Licensed Clinical Social Worker

## 2021-02-03 DIAGNOSIS — F320 Major depressive disorder, single episode, mild: Secondary | ICD-10-CM | POA: Diagnosis not present
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
## 2021-02-03 NOTE — Plan of Care (Signed)
Pt agreeable to plan  ?

## 2021-03-04 ENCOUNTER — Ambulatory Visit (HOSPITAL_COMMUNITY): Payer: Medicaid Other | Admitting: Psychiatry

## 2021-03-17 ENCOUNTER — Encounter: Payer: Self-pay | Admitting: Podiatry

## 2021-03-17 ENCOUNTER — Ambulatory Visit (INDEPENDENT_AMBULATORY_CARE_PROVIDER_SITE_OTHER): Payer: Medicaid Other | Admitting: Podiatry

## 2021-03-17 ENCOUNTER — Other Ambulatory Visit: Payer: Self-pay

## 2021-03-17 DIAGNOSIS — Q828 Other specified congenital malformations of skin: Secondary | ICD-10-CM

## 2021-03-17 DIAGNOSIS — M79675 Pain in left toe(s): Secondary | ICD-10-CM | POA: Diagnosis not present

## 2021-03-17 DIAGNOSIS — B351 Tinea unguium: Secondary | ICD-10-CM | POA: Diagnosis not present

## 2021-03-17 DIAGNOSIS — G629 Polyneuropathy, unspecified: Secondary | ICD-10-CM

## 2021-03-17 DIAGNOSIS — M79674 Pain in right toe(s): Secondary | ICD-10-CM

## 2021-03-18 ENCOUNTER — Telehealth (HOSPITAL_COMMUNITY): Payer: Medicaid Other | Admitting: Psychiatry

## 2021-03-18 ENCOUNTER — Encounter (HOSPITAL_COMMUNITY): Payer: Self-pay

## 2021-03-23 ENCOUNTER — Ambulatory Visit (HOSPITAL_COMMUNITY): Payer: Medicaid Other | Admitting: Licensed Clinical Social Worker

## 2021-03-24 NOTE — Progress Notes (Signed)
?  Subjective:  ?Patient ID: Stacey [name], female    DOB: 03/30/1985,  MRN: [id number] ? ?Wolfgang Phoenix presents to clinic today for at risk foot care with h/o clotting disorder and painful porokeratotic lesion(s) bilaterally and painful mycotic toenails that limit ambulation. Painful toenails interfere with ambulation. Aggravating factors include wearing enclosed shoe gear. Pain is relieved with periodic professional debridement. Painful porokeratotic lesions are aggravated when weightbearing with and without shoegear. Pain is relieved with periodic professional debridement. ? ?New problem(s): None.  ? ?PCP is [facility], [facility] , and last visit was January 12, 2021. ? ?Allergies  ?Allergen Reactions  ? Metformin Diarrhea  ? ? ?Review of Systems: Negative except as noted in the HPI. ? ?Objective: No changes noted in today's physical examination. ? ?Constitutional Stacey [name] is a pleasant 36 y.o. African American female, in NAD. AAO x 3.   ?Vascular CFT immediate b/l LE. Palpable DP/PT pulses b/l LE. Digital hair absent b/l. Skin temperature gradient WNL b/l. No pain with calf compression b/l. Trace edema noted b/l. No cyanosis or clubbing noted b/l LE.  ?Neurologic Normal speech. Oriented to person, place, and time. Protective sensation diminished with 10g monofilament b/l.  ?Dermatologic Pedal integument with normal turgor, texture and tone b/l LE. No open wounds b/l. No interdigital macerations b/l. Toenails 1-5 b/l elongated, thickened, discolored with subungual debris. +Tenderness with dorsal palpation of nailplates. Porokeratotic lesion(s) noted bilateral great toes, submet head 2 left foot, and submet head 5 left foot.  ?Orthopedic: Muscle strength 5/5 to all LE muscle groups of left lower extremity. Muscle strength 4/5to all LE muscle groups of right lower extremity. Utilizes cane for ambulation assistance.  ? ?Radiographs: None ?No results found for: HGBA1C   ? ?Assessment/Plan: ?1. Pain due to onychomycosis of toenails of both feet   ?2. Porokeratosis   ?3. Neuropathy   ?-No new findings. No new orders. ?-Medicaid ABN signed for this year. Patient consents for services of paring of calluses today. Copy has been placed in patient chart. ?-Toenails 1-5 b/l were debrided in length and girth with sterile nail nippers and dremel without iatrogenic bleeding.  ?-Painful porokeratotic lesion(s) bilateral great toes, submet head 2 left foot, and submet head 5 left foot pared and enucleated with sterile scalpel blade without incident. Total number of lesions debrided=4. ?-Patient/POA to call should there be question/concern in the interim.  ? ?Return in about 3 months (around 06/17/2021). ? ?Marzetta Board, DPM  ?

## 2021-04-13 ENCOUNTER — Encounter (HOSPITAL_COMMUNITY): Payer: Self-pay

## 2021-04-13 ENCOUNTER — Ambulatory Visit (INDEPENDENT_AMBULATORY_CARE_PROVIDER_SITE_OTHER): Payer: Medicaid Other | Admitting: Licensed Clinical Social Worker

## 2021-04-13 DIAGNOSIS — F331 Major depressive disorder, recurrent, moderate: Secondary | ICD-10-CM | POA: Diagnosis not present

## 2021-04-13 DIAGNOSIS — F339 Major depressive disorder, recurrent, unspecified: Secondary | ICD-10-CM | POA: Insufficient documentation

## 2021-04-13 DIAGNOSIS — F411 Generalized anxiety disorder: Secondary | ICD-10-CM | POA: Insufficient documentation

## 2021-04-13 NOTE — Progress Notes (Signed)
? ?THERAPIST PROGRESS NOTE ? ?Virtual Visit via Video Note ? ?I connected with Stacey [name] on 04/13/21 at  2:00 PM EDT by a video enabled telemedicine application and verified that I am speaking with the correct person using two identifiers. ? ?Location: ?Patient: [facility]  ?Provider: Provider Home  ?  ?I discussed the limitations of evaluation and management by telemedicine and the availability of in person appointments. The patient expressed understanding and agreed to proceed. ? ? ?  ?I discussed the assessment and treatment plan with the patient. The patient was provided an opportunity to ask questions and all were answered. The patient agreed with the plan and demonstrated an understanding of the instructions. ?  ?The patient was advised to call back or seek an in-person evaluation if the symptoms worsen or if the condition fails to improve as anticipated. ? ?I provided 60 minutes of non-face-to-face time during this encounter. ? ? ?Weber Cooks, LCSW  ? ?Participation Level: Active ? ?Behavioral Response: CasualAlertAnxious and Depressed ? ?Type of Therapy: Individual Therapy ? ?Treatment Goals addressed: decrease PHQ-9 below 10  ? ?ProgressTowards Goals: Not Progressing ? ?Interventions: CBT, Motivational Interviewing, Solution Focused, and Supportive ? ?Summary: Stacey [name] is a 36 y.o. female who presents with depressed and anxious mood\affect.  Patient was pleasant, cooperative, maintained good eye contact.  She engaged well in therapy session was dressed casually. ? Patient presented today with mother.  Patient reports that last appointment needed to be canceled due to transportation issues.  LCSW indicates an increase in PHQ-9.  Patient maxed out on PHQ-9 score 2027.  LCSW did note last time that a higher level of care may be needed for this patient and at this time this is what is recommended.  LCSW provided patient for resources listed below.  Patient notes to have cognitive  delays due to a stroke that happened over 10 years ago.  Patient also has physical limitations such as hemiparesis  and aphasia.  Patient's mother indicates that she is concerned as she is 38 years old and does not feel like patient is going to get the care that she needs if something is to happen to her. ? ?Suicidal/Homicidal: Nowithout intent/plan ? ?Therapist Response:  ? ? Intervention/Plan: LCSW utilized therapies for solution focused therapy, supportive therapy, support person centered therapy and CBT therapy.  LCSW administered a PHQ-9 and educated mother and patient on depression scale.  LCSW offered supportive therapy for praise and encouragement.  LCSW utilized solution focused therapy for resources and referrals listed below.  Plan for patient is to follow-up with resources listed below as LCSW's recommendation is for a higher level of care at this time. ? ? ?Diagnosis: Moderate episode of recurrent major depressive disorder (HCC) ? ?GAD (generalized anxiety disorder) ? ?Collaboration of Care: Referral to [facility] or [facility] for more frequent therapy than 1 x monthly. LCSW also provided pt and mother with psychosocial rehab with [facility]. LCSW provided pt with [place] for in-home programs and housing programs  ? ?Patient/Guardian was advised Release of Information must be obtained prior to any record release in order to collaborate their care with an outside provider. Patient/Guardian was advised if they have not already done so to contact the registration department to sign all necessary forms in order for [place] to release information regarding their care.  ? ?Consent: Patient/Guardian gives verbal consent for treatment and assignment of benefits for services provided during this visit. Patient/Guardian expressed understanding and  agreed to proceed.  ? ?Weber Cooks, LCSW ?04/13/2021 ? ?

## 2021-04-13 NOTE — Plan of Care (Signed)
?  Problem: Depression CCP Problem  1 MDD  ?Goal: Increase social interaction to 3 x weekly  ?Outcome: Not Progressing ?Goal: 1 activity outside of TV pt can engage in  ?Outcome: Not Progressing ?Goal: Decrease naps to 1 hour 1 x per day.  ?Outcome: Not Progressing ?Goal: STG: Peg WILL PARTICIPATE IN AT LEAST 80% OF SCHEDULED INDIVIDUAL PSYCHOTHERAPY SESSIONS ?Outcome: Not Progressing ?Goal: STG: [place] WILL COMPLETE AT LEAST 80% OF ASSIGNED HOMEWORK ?Outcome: Not Progressing ?Goal: STG: Reduce overall depression score by a minimum of 25% on the Patient Health Questionnaire (PHQ-9) or the Montgomery-Asberg Depression Rating Scale (MADRS) ?Outcome: Not Progressing ?  ?

## 2021-04-14 ENCOUNTER — Telehealth (INDEPENDENT_AMBULATORY_CARE_PROVIDER_SITE_OTHER): Payer: Medicaid Other | Admitting: Physician Assistant

## 2021-04-14 ENCOUNTER — Encounter (HOSPITAL_COMMUNITY): Payer: Self-pay | Admitting: Physician Assistant

## 2021-04-14 DIAGNOSIS — F411 Generalized anxiety disorder: Secondary | ICD-10-CM

## 2021-04-14 DIAGNOSIS — F331 Major depressive disorder, recurrent, moderate: Secondary | ICD-10-CM

## 2021-04-14 MED ORDER — ARIPIPRAZOLE 10 MG PO TABS: 10.0000 mg | ORAL_TABLET | Freq: Every day | ORAL | 1 refills | Status: DC

## 2021-04-14 MED ORDER — SERTRALINE HCL 50 MG PO TABS: 150.0000 mg | ORAL_TABLET | Freq: Every day | ORAL | 1 refills | Status: DC

## 2021-04-14 NOTE — Progress Notes (Addendum)
Psychiatric Initial Adult Assessment  ? ?Virtual Visit via Telephone Note ? ?I connected with Stacey [name] on 04/14/21 at  9:00 AM EDT by telephone and verified that I am speaking with the correct person using two identifiers. ? ?Location: ?Patient: Home ?Provider: Clinic ?  ?I discussed the limitations, risks, security and privacy concerns of performing an evaluation and management service by telephone and the availability of in person appointments. I also discussed with the patient that there may be a patient responsible charge related to this service. The patient expressed understanding and agreed to proceed. ? ?Follow Up Instructions: ? ?I discussed the assessment and treatment plan with the patient. The patient was provided an opportunity to ask questions and all were answered. The patient agreed with the plan and demonstrated an understanding of the instructions. ?  ?The patient was advised to call back or seek an in-person evaluation if the symptoms worsen or if the condition fails to improve as anticipated. ? ?I provided 36 minutes of non-face-to-face time during this encounter. ? ?Meta HatchetUchenna E Alroy Portela, PA ? ? ?Patient Identification: Stacey JacksonLatasha R [name] ?MRN:  [id number] ?Date of Evaluation:  04/14/2021 ?Referral Source: Walk-in/Establish psychiatric care ?Chief Complaint:   ?Chief Complaint  ?Patient presents with  ? Medication Management  ? ?Visit Diagnosis:  ?  ICD-10-CM   ?1. GAD (generalized anxiety disorder)  F41.1 sertraline (ZOLOFT) 50 MG tablet  ?  ?2. Moderate episode of recurrent major depressive disorder (HCC)  F33.1 sertraline (ZOLOFT) 50 MG tablet  ?  ARIPiprazole (ABILIFY) 10 MG tablet  ?  ? ? ?History of Present Illness:   ? ?Stacey [name] is a 36 year old female with a past psychiatric history significant for depression [facility], accompanied by her foster mother Stacey Ready(Carol Aydelotte), to establish psychiatric  care and for medication management. ? ?Per patient's foster mother, patient has been on sertraline and Abilify for 7 years.  She is currently taking Abilify 10 mg daily and sertraline 100 mg daily.  Patient's foster mother states that the patient appears to be even keeled on her medications.  She reports that in the past, patient has gone to day programs has not been involved in a day program some time.  She reports that the patient may be [facility] for [facility] and to meet other people.  Stacey [name] is primary caregiver to the patient and states that the patient must be assisted with most activities of daily living such as cleaning and dressing.  She reports that the patient watches TV and is on her tablet during the majority of her day.  She reports that the patient has been encouraged to write and draw as other forms of activities.  Patient has right-sided impairment due to past history of cerebrovascular accident.  She reports that the patient is mobile but is slow moving. ? ?Stacey [name] states that patient is not motivated to do anything and has become comfortable with doing nothing.  She is unsure of anxiety, but states that the patient has no desire to write or do anything.  Patient reports that her mood is stable and states that her medications have been helpful.  Patient denies depressive symptoms or anxiety.  She further denies mood swings or irritability.  Patient endorses some motivation but did not elaborate on her motivation.  Patient denies having hobbies but states that she used to sing and paint.  Patient denies any new stressors at  this time.  Patient denies a past history of hospitalization due to mental health.  She further denies past history of suicide attempt nor has she engaged in self-harm.  A PHQ-9 screen was performed with the patient scoring a 24.  A GAD-7 screen was also performed with the patient scoring a 21. ? ?Towards the end of the encounter, patient's  foster mother states that he felt that the patient did not understand some of the questions asked of her during the encounter.  She reports that she often encourages the patient to ask questions during her appointments to better understand what is going on. ? ?Patient is alert and oriented, calm, cooperative, and fully engaged in conversation during the encounter.  Patient denies suicidal or homicidal ideations.  She further denies auditory or visual hallucinations and does not appear to be responding to internal/external stimuli.  Patient endorses good sleep and receives on average 7 hours of sleep each night.  Patient endorses good appetite and eats on average 3 meals per day.  Patient denies alcohol consumption, tobacco use, and illicit drug use. ? ?Associated Signs/Symptoms: ?Depression Symptoms:  depressed mood, ?anhedonia, ?hypersomnia, ?psychomotor retardation, ?feelings of worthlessness/guilt, ?difficulty concentrating, ?hopelessness, ?impaired memory, ?recurrent thoughts of death, ?anxiety, ?panic attacks, ?disturbed sleep, ?weight gain, ?decreased appetite, ?(Hypo) Manic Symptoms:  Distractibility, ?Licensed conveyancer, ?[employer], ?Labiality of Mood, ?Anxiety Symptoms:  Obsessive Compulsive Symptoms:   Patient expresses that she is extremely organized and likes having things a certain, ?Social Anxiety, ?Psychotic Symptoms:   None ?PTSD Symptoms: ?Had a traumatic exposure:  Patient denies traumatic experiences ?Had a traumatic exposure in the last month:  N/A ?Re-experiencing:  None ?Hypervigilance:  Yes ?Hyperarousal:  Difficulty Concentrating ?Emotional Numbness/Detachment ?Increased Startle Response ?Avoidance:  None ? ?Past Psychiatric History:  ?Depression ?Anxiety ? ?Previous Psychotropic Medications: Yes  ? ?Substance Abuse History in the last 12 months:  No. ? ?Consequences of Substance Abuse: ?Negative ? ?Past Medical History:  ?Past Medical History:  ?Diagnosis Date  ? Hypertension   ?  Stroke [facility])   ? History reviewed. No pertinent surgical history. ? ?Family Psychiatric History:  ?Patient is adopted.  Per patient's guardian, patient does not know too much about her family psychiatric history ? ?Family History: History reviewed. No pertinent family history. ? ?Social History:   ?Social History  ? ?Socioeconomic History  ? Marital status: Single  ?  Spouse name: Not on file  ? Number of children: Not on file  ? Years of education: Not on file  ? Highest education level: Not on file  ?Occupational History  ? Not on file  ?Tobacco Use  ? Smoking status: Never  ? Smokeless tobacco: Never  ?Vaping Use  ? Vaping Use: Never used  ?Substance and Sexual Activity  ? Alcohol use: No  ? Drug use: No  ? Sexual activity: Not on file  ?Other Topics Concern  ? Not on file  ?Social History Narrative  ? Not on file  ? ?Social Determinants of Health  ? ?Financial Resource Strain: Not on file  ?Food Insecurity: Not on file  ?Transportation Needs: Not on file  ?Physical Activity: Not on file  ?Stress: Not on file  ?Social Connections: Not on file  ? ? ?Additional Social History:  ?Patient currently lives with her guardian Stacey [name]) patient was adopted by her guardian at the age of 89.  Patient reports that she enjoys sleeping and watching TV.  Patient has housing through her mother as well as transportation.  Per patient's  guardian, he would like for the patient to be part of the day program to increase the amount of activity and interaction she has with others.  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 ? ?Allergies:   ?Allergies  ?Allergen Reactions  ? Metformin Diarrhea  ? ? ?Metabolic Disorder Labs: ?No results found for: HGBA1C, MPG ?No results found for: PROLACTIN ?No results found for: CHOL, TRIG, HDL, CHOLHDL, VLDL, LDLCALC ?No results found for: TSH ? ?Therapeutic Level Labs: ?No results found for: LITHIUM ?No results found for: CBMZ ?No results  found for: VALPROATE ? ?Current Medications: ?Current Outpatient Medications  ?Medication Sig Dispense Refill  ? ARIPiprazole (ABILIFY) 10 MG tablet Take 1 tablet (10 mg total) by mouth daily. 30 tablet 1  ? As

## 2021-05-25 ENCOUNTER — Telehealth (HOSPITAL_COMMUNITY): Payer: Self-pay | Admitting: Physician Assistant

## 2021-05-25 NOTE — Telephone Encounter (Signed)
Pt clld to change appt from virtual to in person. Changes made/AL

## 2021-05-26 ENCOUNTER — Encounter (HOSPITAL_COMMUNITY): Payer: Self-pay | Admitting: Physician Assistant

## 2021-05-26 ENCOUNTER — Ambulatory Visit (INDEPENDENT_AMBULATORY_CARE_PROVIDER_SITE_OTHER): Payer: Medicaid Other | Admitting: Physician Assistant

## 2021-05-26 DIAGNOSIS — F411 Generalized anxiety disorder: Secondary | ICD-10-CM

## 2021-05-26 DIAGNOSIS — F331 Major depressive disorder, recurrent, moderate: Secondary | ICD-10-CM | POA: Diagnosis not present

## 2021-05-28 ENCOUNTER — Encounter (HOSPITAL_COMMUNITY): Payer: Self-pay | Admitting: Physician Assistant

## 2021-05-28 MED ORDER — SERTRALINE HCL 50 MG PO TABS: 150.0000 mg | ORAL_TABLET | Freq: Every day | ORAL | 2 refills | Status: DC

## 2021-05-28 MED ORDER — ARIPIPRAZOLE 10 MG PO TABS: 10.0000 mg | ORAL_TABLET | Freq: Every day | ORAL | 2 refills | Status: DC

## 2021-05-28 NOTE — Progress Notes (Signed)
[place] MD/PA/NP OP Progress Note  05/28/2021 10:31 PM Stacey [name]  MRN:  [id number]  Chief Complaint:  Chief Complaint  Patient presents with   Medication Management   HPI:   Stacey [name] is a 36 year old female with a past psychiatric history significant for major depressive disorder and generalized anxiety disorder who presents to [facility] behavioral health outpatient clinic, accompanied by her foster mother Stacey [name]), for follow-up and medication management.  Patient is currently being managed on the following medications:  Aripiprazole 10 mg daily Sertraline 150 mg daily  Patient reports that her dosage adjustments on her medications have been going well and that she has been tolerating the medication well.  Patient states that her mood is all right but continues to endorse the same amount of depression.  Patient adds that she experiences depression a lot but denies experiencing depressive episodes every day.  Patient denies sadness or feelings of guilt/worthlessness but does endorse lack of motivation, decreased concentration, and crying spells.  Patient's foster mother notes that she has not observed the patient crying spells.  Patient also endorses anxiety but denies any new stressors.  A PHQ-9 screen was performed with the patient scoring a 19.  Patient's foster mother states that she is unsure of patient understands some of the questions addressed to her during her encounters.  For example, patient's foster mother states that whenever patient was completing her PHQ-9, she kept saying the #3 for some of the symptoms; however, she has not seen some of the symptoms that the patient endorses.  Patient's foster mother states that the patient spends most of her day watching movies and does not interact with very many people.  Patient's mother states that the patient is very familiar with her friends and often times hangs out with them.  Patient's mother would like for the  patient to be part of the day program that would allow her to interact and make friends with other individuals.  She notes that taking care of the patient is a full time job and she would also like to be able to enjoy aspects of her life while the patient is part of her program.  Patient's mother expresses concerns over the patient's lack of contact with the outside world.  Patient is alert and oriented x4, calm, cooperative, and fully engaged in conversation during the encounter.  Patient endorses happy mood.  Patient denies suicidal or homicidal ideations.  She further denies auditory or visual hallucinations and does not appear to be responding to internal/external stimuli.  Patient endorses getting a lot of sleep.  Patient endorses good appetite and states that she eats a lot.  Patient denies alcohol consumption, tobacco use, and illicit drug use.  Visit Diagnosis:    ICD-10-CM   1. Moderate episode of recurrent major depressive disorder (HCC)  F33.1 ARIPiprazole (ABILIFY) 10 MG tablet    sertraline (ZOLOFT) 50 MG tablet    2. GAD (generalized anxiety disorder)  F41.1 sertraline (ZOLOFT) 50 MG tablet      Past Psychiatric History:  Major depressive disorder Generalized anxiety disorder  Past Medical History:  Past Medical History:  Diagnosis Date   Hypertension    Stroke [facility])    History reviewed. No pertinent surgical history.  Family Psychiatric History:  Patient is adopted.  Per patient's guardian, patient does not know too much about her family psychiatric history  Family History: History reviewed. No pertinent family history.  Social History:  Social History   Socioeconomic History  Marital status: Single    Spouse name: Not on file   Number of children: Not on file   Years of education: Not on file   Highest education level: Not on file  Occupational History   Not on file  Tobacco Use   Smoking status: Never   Smokeless tobacco: Never  Vaping Use   Vaping Use:  Never used  Substance and Sexual Activity   Alcohol use: No   Drug use: No   Sexual activity: Not on file  Other Topics Concern   Not on file  Social History Narrative   Not on file   Social Determinants of Health   Financial Resource Strain: Not on file  Food Insecurity: Not on file  Transportation Needs: Not on file  Physical Activity: Not on file  Stress: Not on file  Social Connections: Not on file    Allergies:  Allergies  Allergen Reactions   Metformin Diarrhea    Metabolic Disorder Labs: No results found for: HGBA1C, MPG No results found for: PROLACTIN No results found for: CHOL, TRIG, HDL, CHOLHDL, VLDL, LDLCALC No results found for: TSH  Therapeutic Level Labs: No results found for: LITHIUM No results found for: VALPROATE No components found for:  CBMZ  Current Medications: Current Outpatient Medications  Medication Sig Dispense Refill   Ascorbic Acid 500 MG/5ML LIQD Take by mouth.     Ascorbic Acid 500 MG/5ML SYRP Take by mouth.      aspirin 325 MG EC tablet Take by mouth.     aspirin 325 MG tablet Take 325 mg by mouth daily.      atorvastatin (LIPITOR) 80 MG tablet Take by mouth.     atorvastatin (LIPITOR) 80 MG tablet Take by mouth.     baclofen (LIORESAL) 10 MG tablet Take 10 mg by mouth 3 (three) times daily.      benzonatate (TESSALON) 200 MG capsule TAKE ONE CAPSULE BY MOUTH THREE TIMES A DAY FOR UP TO 7 DAYS     Blood Glucose Monitoring Suppl (FIFTY50 GLUCOSE METER 2.0) w/Device KIT See admin instructions.     calcium-vitamin D (OSCAL WITH D) 500-200 MG-UNIT TABS tablet Take by mouth.      carbamazepine (TEGRETOL XR) 100 MG 12 hr tablet Take by mouth.      clopidogrel (PLAVIX) 75 MG tablet Take 75 mg by mouth daily.      Cysteamine Bitartrate (PROCYSBI) 300 MG PACK Use 1 each as directed.     Docusate Sodium (DSS) 100 MG CAPS Take by mouth.      empagliflozin (JARDIANCE) 10 MG TABS tablet Take 1 tablet by mouth daily.     esomeprazole (NEXIUM)  40 MG capsule Take by mouth.      ferrous sulfate 300 (60 Fe) MG/5ML syrup Take by mouth.      glucose blood (PRECISION QID TEST) test strip Use 1 strip as directed.     Incontinence Supply Disposable (UNDERPADS) MISC 1 CASE + 5 PACKS PEACH!     ketoconazole (NIZORAL) 2 % cream Apply topically daily.     lisdexamfetamine (VYVANSE) 30 MG capsule TK 1 C PO QAM     medroxyPROGESTERone Acetate 150 MG/ML SUSY Inject into the muscle.      medroxyPROGESTERone Acetate 150 MG/ML SUSY Inject as directed See admin instructions.     metFORMIN (GLUCOPHAGE) 500 MG tablet TK 1 T PO D WITH BRE  2   metoprolol tartrate (LOPRESSOR) 25 MG tablet Take by mouth.  nitrofurantoin, macrocrystal-monohydrate, (MACROBID) 100 MG capsule Take 100 mg by mouth every 12 (twelve) hours.     ondansetron (ZOFRAN-ODT) 4 MG disintegrating tablet Take by mouth.      Oxycodone HCl 10 MG TABS Take by mouth.      ramipril (ALTACE) 5 MG capsule Take 5 mg by mouth daily.      Skin Protectants, Misc. (DERMACERIN) CREA Apply topically.      valACYclovir (VALTREX) 500 MG tablet 1 tab by mouth three times daily for 5 days as needed     Vitamin D, Ergocalciferol, (DRISDOL) 1.25 MG (50000 UNIT) CAPS capsule Take 50,000 Units by mouth once a week.     warfarin (COUMADIN) 1 MG tablet Take by mouth.      ARIPiprazole (ABILIFY) 10 MG tablet Take 1 tablet (10 mg total) by mouth daily. 30 tablet 2   fluticasone (FLONASE) 50 MCG/ACT nasal spray Place into the nose.     medroxyPROGESTERone (DEPO-PROVERA) 150 MG/ML injection Inject into the muscle.      sertraline (ZOLOFT) 50 MG tablet Take 3 tablets (150 mg total) by mouth daily. 90 tablet 2   No current facility-administered medications for this visit.     Musculoskeletal: Strength & Muscle Tone:  Patient has a past history of stroke with some lingering effects present on the right side of her body. Gait & Station:  Patient occasionally utilizes a cane and wheelchair to get around Patient  leans: N/A  Psychiatric Specialty Exam: Review of Systems  Psychiatric/Behavioral:  Negative for decreased concentration, dysphoric mood, hallucinations, self-injury, sleep disturbance and suicidal ideas. The patient is nervous/anxious. The patient is not hyperactive.    Blood pressure (!) 144/93, pulse 96, height _0  (1.676 m).Body mass index is 27.44 kg/m.  General Appearance: Casual  Eye Contact:  Good  Speech:  Clear and Coherent and Normal Rate  Volume:  Normal  Mood:  Anxious and Depressed  Affect:  Congruent  Thought Process:  Coherent  Orientation:  Full (Time, Place, and Person)  Thought Content: WDL   Suicidal Thoughts:  No  Homicidal Thoughts:  No  Memory:  Immediate;   Good Recent;   Fair Remote;   Fair  Judgement:  Fair  Insight:  Fair  Psychomotor Activity:  Normal  Concentration:  Concentration: Fair and Attention Span: Fair  Recall:  [employer] of Knowledge: Fair  Language: Fair  Akathisia:  No  Handed:  Right  AIMS (if indicated): not done  Assets:  Communication Skills Desire for Improvement Housing Social Support  ADL's:  Impaired  Cognition: Impaired,  Mild  Sleep:  Fair   Screenings: GAD-7    Flowsheet Row Video Visit from 04/14/2021 in [facility]  Total GAD-7 Score 21      PHQ2-9    [place] from 05/26/2021 in [facility] Video Visit from 04/14/2021 in [facility] Counselor from 04/13/2021 in [facility] Counselor from 02/03/2021 in [facility]  PHQ-2 Total Score _1 PHQ-9 Total Score _2 [place] from 05/26/2021 in [facility] Video Visit from 04/14/2021 in [facility] Counselor from 02/03/2021 in [place] No Risk No Risk No Risk         Assessment and Plan:   Oveda Dadamo. Mahn is  a 36 year old female with a past psychiatric history significant for major depressive disorder and generalized anxiety disorder who presents to [facility] behavioral health outpatient clinic, accompanied by her foster mother Stacey [name]), for follow-up and medication management.  Continues to endorse some depression and anxiety; however, patient's mother expresses some concerns over the reliability of patient's account of her mental health.  Many of the symptoms that the patient endorsed have not been observed by patient's mother.  Patient's mother believes that the patient needs to have more contact with the outside world and would like for the patient to be enrolled in a day program that allows her to interact with others.  Provider recommended patient continue to take her medications as prescribed due to the seemingly unreliable history patient gave regarding her mental health.  Provider to provide [place] resources to patient.  Provider instructed patient and her mother to reach out to [place] to see if there are any programs that the patient could be enrolled in.  Provider also suggested therapy services for patient's mother to reach out to.  Collaboration of Care: Collaboration of Care: Medication Management AEB provider managing patient's psychiatric medications, Primary Care Provider AEB patient being seen by a primary care provider, Psychiatrist AEB patient being followed by a mental health provider, and Other provider involved in patient's care AEB patient is being seen by podiatry  Patient/Guardian was advised Release of Information must be obtained prior to any record release in order to collaborate their care with an outside provider. Patient/Guardian was advised if they have not already done so to contact the registration department to sign all necessary forms in order for [place] to release information regarding  their care.   Consent: Patient/Guardian gives verbal consent for treatment and assignment of benefits for services provided during this visit. Patient/Guardian expressed understanding and agreed to proceed.   1. Moderate episode of recurrent major depressive disorder (HCC)  - ARIPiprazole (ABILIFY) 10 MG tablet; Take 1 tablet (10 mg total) by mouth daily.  Dispense: 30 tablet; Refill: 2 - sertraline (ZOLOFT) 50 MG tablet; Take 3 tablets (150 mg total) by mouth daily.  Dispense: 90 tablet; Refill: 2  2. GAD (generalized anxiety disorder)  - sertraline (ZOLOFT) 50 MG tablet; Take 3 tablets (150 mg total) by mouth daily.  Dispense: 90 tablet; Refill: 2  Patient to follow up in 2 months Provider spent a total of 32 minutes with the patient/reviewing patient's chart  Malachy Mood, PA 05/28/2021, 10:31 PM

## 2021-06-07 ENCOUNTER — Ambulatory Visit: Payer: Self-pay | Admitting: Family Medicine

## 2021-06-22 ENCOUNTER — Ambulatory Visit (INDEPENDENT_AMBULATORY_CARE_PROVIDER_SITE_OTHER): Payer: Self-pay | Admitting: Podiatry

## 2021-06-22 DIAGNOSIS — Z91199 Patient's noncompliance with other medical treatment and regimen due to unspecified reason: Secondary | ICD-10-CM

## 2021-06-24 NOTE — Progress Notes (Signed)
   Complete physical exam  Patient: Stacey [name]   DOB: 10/23/2000   36 y.o. Female  MRN: [id number]  Subjective:    No chief complaint on file.   Stacey [name] is a 36 y.o. female who presents today for a complete physical exam. She reports consuming a {diet types:17450} diet. {types:19826} She generally feels {DESC; WELL/FAIRLY WELL/POORLY:18703}. She reports sleeping {DESC; WELL/FAIRLY WELL/POORLY:18703}. She {does/does not:200015} have additional problems to discuss today.    Most recent fall risk assessment:    06/30/2021   10:42 AM  Fall Risk   Falls in the past year? 0  Number falls in past yr: 0  Injury with Fall? 0  Risk for fall due to : No Fall Risks  Follow up Falls evaluation completed     Most recent depression screenings:    06/30/2021   10:42 AM 05/21/2020   10:46 AM  PHQ 2/9 Scores  PHQ - 2 Score 0 0  PHQ- 9 Score 5     {VISON DENTAL STD PSA (Optional):27386}  {History (Optional):23778}  Patient Care Team: Jessup, Joy, NP as PCP - General (Nurse Practitioner)   Outpatient Medications Prior to Visit  Medication Sig   fluticasone (FLONASE) 50 MCG/ACT nasal spray Place 2 sprays into both nostrils in the morning and at bedtime. After 7 days, reduce to once daily.   norgestimate-ethinyl estradiol (SPRINTEC 28) 0.25-35 MG-MCG tablet Take 1 tablet by mouth daily.   Nystatin POWD Apply liberally to affected area 2 times per day   spironolactone (ALDACTONE) 100 MG tablet Take 1 tablet (100 mg total) by mouth daily.   No facility-administered medications prior to visit.    ROS        Objective:     There were no vitals taken for this visit. {Vitals History (Optional):23777}  Physical Exam   No results found for any visits on 08/05/21. {Show previous labs (optional):23779}    Assessment & Plan:    Routine Health Maintenance and Physical Exam  Immunization History  Administered Date(s) Administered   DTaP 01/06/1999, 03/04/1999,  05/13/1999, 01/27/2000, 08/12/2003   Hepatitis A 06/08/2007, 06/13/2008   Hepatitis B 10/24/1998, 12/01/1998, 05/13/1999   HiB (PRP-OMP) 01/06/1999, 03/04/1999, 05/13/1999, 01/27/2000   IPV 01/06/1999, 03/04/1999, 11/01/1999, 08/12/2003   Influenza,inj,Quad PF,6+ Mos 09/13/2013   Influenza-Unspecified 12/14/2011   MMR 10/31/2000, 08/12/2003   Meningococcal Polysaccharide 06/13/2011   Pneumococcal Conjugate-13 01/27/2000   Pneumococcal-Unspecified 05/13/1999, 07/27/1999   Tdap 06/13/2011   Varicella 11/01/1999, 06/08/2007    Health Maintenance  Topic Date Due   HIV Screening  Never done   Hepatitis C Screening  Never done   INFLUENZA VACCINE  08/03/2021   PAP-Cervical Cytology Screening  08/05/2021 (Originally 10/23/2019)   PAP SMEAR-Modifier  08/05/2021 (Originally 10/23/2019)   TETANUS/TDAP  08/05/2021 (Originally 06/12/2021)   HPV VACCINES  Discontinued   COVID-19 Vaccine  Discontinued    Discussed health benefits of physical activity, and encouraged her to engage in regular exercise appropriate for her age and condition.  Problem List Items Addressed This Visit   None Visit Diagnoses     Annual physical exam    -  Primary   Cervical cancer screening       Need for Tdap vaccination          No follow-ups on file.     Joy Jessup, NP   

## 2021-07-07 DIAGNOSIS — F419 Anxiety disorder, unspecified: Secondary | ICD-10-CM | POA: Insufficient documentation

## 2021-07-27 ENCOUNTER — Encounter (HOSPITAL_COMMUNITY): Payer: Medicaid Other | Admitting: Physician Assistant

## 2021-07-29 ENCOUNTER — Ambulatory Visit (INDEPENDENT_AMBULATORY_CARE_PROVIDER_SITE_OTHER): Payer: Medicaid Other | Admitting: Student in an Organized Health Care Education/Training Program

## 2021-07-29 ENCOUNTER — Telehealth (HOSPITAL_COMMUNITY): Payer: Self-pay

## 2021-07-29 DIAGNOSIS — F331 Major depressive disorder, recurrent, moderate: Secondary | ICD-10-CM

## 2021-07-29 DIAGNOSIS — F411 Generalized anxiety disorder: Secondary | ICD-10-CM | POA: Diagnosis not present

## 2021-07-29 MED ORDER — ARIPIPRAZOLE 10 MG PO TABS: 10.0000 mg | ORAL_TABLET | Freq: Every day | ORAL | 2 refills | Status: DC

## 2021-07-29 MED ORDER — SERTRALINE HCL 50 MG PO TABS: 150.0000 mg | ORAL_TABLET | Freq: Every day | ORAL | 2 refills | Status: DC

## 2021-07-29 NOTE — BH Assessment (Signed)
Care Management [facility]   Dr. Joyce Gross referred the patient to me to receive Care Management Services.   I met with the patient and her mother.  The patient was requesting services at a Day program.  Patient reports that she received services at a Day program in the past.   Writer referred patient to [employer] at [phone] and spoke to [employer].      Writer and the patient spoke to the intake coordinator and provided the needed information regarding insurance.  The intake worker sent the patient and her mother a secure link to complete the intake process so that the patient can receive services through their Day Program.

## 2021-07-29 NOTE — Progress Notes (Signed)
BH MD/PA/NP OP Progress Note  07/29/2021 12:17 PM Stacey [name]  MRN:  [id number]  Chief Complaint: No chief complaint on file.  HPI: Stacey [name] is a 36 year old female with a past psychiatric history significant for major depressive disorder and generalized anxiety disorder who presents to [facility] behavioral health outpatient clinic, accompanied by her foster mother Stacey [name]), for follow-up and medication management.  Patient is currently being managed on the following medications:  Aripiprazole 10 mg daily Sertraline 150 mg daily  Patient reports that her mood has been "good" and that her appetite has also been "good."  Patient reports she is sleeping very well.  Patient's mother is present for the assessment and does not disagree with the statements.  Patient denies SI, HI, AVH and any symptoms of paranoid delusions.  Provider attempted to do PHQ-9 with patient however it appears that she does not fully understand the questions as she suddenly became pan positive however if the questions were asked in a different way and her answer would change.  For example, patient endorsed that she was having trouble sleeping however when asked if she had any trouble sleeping outside of the PHQ-9 on 2 separate occasions she denied this.  Patient's mother also endorsed that she does not believe patient fully understands the questions based on how she was answering them.  Therefore, this assessment was not completed.  Patient's mother reports that she feels patient is fairly stable at home however, she is still struggling to get patient into a day program and she believes that this to be the most beneficial thing for patient.  Patient's mom reports that unfortunately patient has limited social interactions due to lack of day program activities and this may lead to patient sleeping more and not having appropriate outlets for her age.  Patient is mom reports she feels the medications have  been extremely beneficial and that patient is stable in the home environment and would only benefit more if she was able to have another outlet. Visit Diagnosis:    ICD-10-CM   1. Moderate episode of recurrent major depressive disorder (HCC)  F33.1 ARIPiprazole (ABILIFY) 10 MG tablet    sertraline (ZOLOFT) 50 MG tablet    2. GAD (generalized anxiety disorder)  F41.1 sertraline (ZOLOFT) 50 MG tablet      Past Psychiatric History: Major depressive disorder Generalized anxiety disorder  Past Medical History:  Past Medical History:  Diagnosis Date   Hypertension    Stroke ([place])    No past surgical history on file.  Family Psychiatric History: Patient is adopted.  Per patient's guardian, patient does not know too much about her family psychiatric history  Family History: No family history on file.  Social History:  Social History   Socioeconomic History   Marital status: Single    Spouse name: Not on file   Number of children: Not on file   Years of education: Not on file   Highest education level: Not on file  Occupational History   Not on file  Tobacco Use   Smoking status: Never   Smokeless tobacco: Never  Vaping Use   Vaping Use: Never used  Substance and Sexual Activity   Alcohol use: No   Drug use: No   Sexual activity: Not on file  Other Topics Concern   Not on file  Social History Narrative   Not on file   Social Determinants of Health   Financial Resource Strain: Not on file  Food Insecurity:  Not on file  Transportation Needs: Not on file  Physical Activity: Not on file  Stress: Not on file  Social Connections: Not on file    Allergies:  Allergies  Allergen Reactions   Metformin Diarrhea    Metabolic Disorder Labs: No results found for: "HGBA1C", "MPG" No results found for: "PROLACTIN" No results found for: "CHOL", "TRIG", "HDL", "CHOLHDL", "VLDL", "LDLCALC" No results found for: "TSH"  Therapeutic Level Labs: No results found for:  "LITHIUM" No results found for: "VALPROATE" No results found for: "CBMZ"  Current Medications: Current Outpatient Medications  Medication Sig Dispense Refill   ARIPiprazole (ABILIFY) 10 MG tablet Take 1 tablet (10 mg total) by mouth daily. 30 tablet 2   Ascorbic Acid 500 MG/5ML LIQD Take by mouth.     Ascorbic Acid 500 MG/5ML SYRP Take by mouth.      aspirin 325 MG EC tablet Take by mouth.     aspirin 325 MG tablet Take 325 mg by mouth daily.      atorvastatin (LIPITOR) 80 MG tablet Take by mouth.     atorvastatin (LIPITOR) 80 MG tablet Take by mouth.     baclofen (LIORESAL) 10 MG tablet Take 10 mg by mouth 3 (three) times daily.      benzonatate (TESSALON) 200 MG capsule TAKE ONE CAPSULE BY MOUTH THREE TIMES A DAY FOR UP TO 7 DAYS     Blood Glucose Monitoring Suppl (FIFTY50 GLUCOSE METER 2.0) w/Device KIT See admin instructions.     calcium-vitamin D (OSCAL WITH D) 500-200 MG-UNIT TABS tablet Take by mouth.      clopidogrel (PLAVIX) 75 MG tablet Take 75 mg by mouth daily. Patient taking ASA instead     Cysteamine Bitartrate (PROCYSBI) 300 MG PACK Use 1 each as directed.     Docusate Sodium (DSS) 100 MG CAPS Take by mouth.      esomeprazole (NEXIUM) 40 MG capsule Take by mouth.      ferrous sulfate 300 (60 Fe) MG/5ML syrup Take by mouth.      fluticasone (FLONASE) 50 MCG/ACT nasal spray Place into the nose.     glucose blood (PRECISION QID TEST) test strip Use 1 strip as directed.     Incontinence Supply Disposable (UNDERPADS) MISC 1 CASE + 5 PACKS PEACH!     ketoconazole (NIZORAL) 2 % cream Apply topically daily.     medroxyPROGESTERone (DEPO-PROVERA) 150 MG/ML injection Inject into the muscle.      medroxyPROGESTERone Acetate 150 MG/ML SUSY Inject into the muscle.      medroxyPROGESTERone Acetate 150 MG/ML SUSY Inject as directed See admin instructions.     metoprolol tartrate (LOPRESSOR) 25 MG tablet Take by mouth.     nitrofurantoin, macrocrystal-monohydrate, (MACROBID) 100 MG  capsule Take 100 mg by mouth every 12 (twelve) hours.     ondansetron (ZOFRAN-ODT) 4 MG disintegrating tablet Take by mouth.      ramipril (ALTACE) 5 MG capsule Take 5 mg by mouth daily.      sertraline (ZOLOFT) 50 MG tablet Take 3 tablets (150 mg total) by mouth daily. 90 tablet 2   Skin Protectants, Misc. (DERMACERIN) CREA Apply topically.      valACYclovir (VALTREX) 500 MG tablet 1 tab by mouth three times daily for 5 days as needed     Vitamin D, Ergocalciferol, (DRISDOL) 1.25 MG (50000 UNIT) CAPS capsule Take 50,000 Units by mouth once a week.     No current facility-administered medications for this visit.     Musculoskeletal:  Strength & Muscle Tone: spastic Gait & Station: unsteady, is in wheelchair most of assessment, but able to ambulate with 2 person assist to scale for weight Patient leans: N/A  Psychiatric Specialty Exam: Review of Systems  Psychiatric/Behavioral:  Negative for dysphoric mood, hallucinations, sleep disturbance and suicidal ideas.     Blood pressure 132/85, pulse 77, resp. rate 20, weight 188 lb (85.3 kg), SpO2 100 %.Body mass index is 30.34 kg/m.  General Appearance: Casual  Eye Contact:  Fair  Speech:  Clear and Coherent  Volume:  Normal  Mood:  Euthymic  Affect:  Congruent  Thought Process:  Linear very concrete  Orientation:  Full (Time, Place, and Person)  Thought Content:  Concrete    Suicidal Thoughts:  No  Homicidal Thoughts:  No  Memory:  Immediate;   Fair  Judgement:  Fair  Insight:  Shallow  Psychomotor Activity:  Decreased  Concentration:  Concentration: Fair  Recall:  NA  Fund of Knowledge: Poor  Language: Fair  Akathisia:  Negative    AIMS (if indicated): not done  Assets:  Desire for Improvement Housing Resilience Social Support  ADL's:  Impaired at baseline  Cognition: Impaired,  Mild appears to be at baseline  Sleep:  Good   Screenings: GAD-7    Flowsheet Row Video Visit from [facility]/14/2021 in [facility]  Total GAD-7 Score 21      PHQ2-9    Flowsheet Row Clinical Support from 07/29/2021 in [place] from 05/26/2021 in [facility] Video Visit from 04/14/2021 in [facility] Counselor from 04/13/2021 in [facility] Counselor from 02/03/2021 in [facility]  PHQ-2 Total Score 2 4 6 6 6   PHQ-9 Total Score 5 19 24 27 15       Flowsheet Row Clinical Support from 05/26/2021 in [facility] Video Visit from 04/14/2021 in [facility] Counselor from 02/03/2021 in [place] No Risk No Risk No Risk        Assessment and Plan: Stacey [name] is a 36 year old female with a past psychiatric history significant for major depressive disorder and generalized anxiety disorder who presents to [facility] behavioral health outpatient clinic, accompanied by her foster mother Stacey [name]), for follow-up and medication management.  Based on assessment today patient does not really endorse any significant depression or anxiety.  Patient appears to be comfortable patient's mother endorses that she feels the patient does well at home however she is concerned about patient's lack of social interactions.  Patient did not endorse having adverse side effects to medications.  Therefore patient's medications were refilled at the same dose and patient and patient's mother are redirected to social work for assistance and guidance regarding getting patient to day programming.  Patient's mother endorsed she is having difficulty figuring out who patient's case manager is.   MDD GAD - Continue Abilify 10 mg daily - Continue Zoloft 150 mg daily - Follow-up with patient approximately 2 months  Collaboration of Care: Collaboration of Care: Delete  this  Patient/Guardian was advised Release of Information must be obtained prior to any record release in order to collaborate their care with an outside provider. Patient/Guardian was advised if they have not already done so to contact the registration department to sign all necessary forms in order for [place] to release information regarding their care.  Consent: Patient/Guardian gives verbal consent for treatment and assignment of benefits for services provided during this visit. Patient/Guardian expressed understanding and agreed to proceed.    Freida Busman, MD 07/29/2021, 12:17 PM

## 2021-09-15 DIAGNOSIS — R801 Persistent proteinuria, unspecified: Secondary | ICD-10-CM | POA: Insufficient documentation

## 2021-09-15 DIAGNOSIS — D631 Anemia in chronic kidney disease: Secondary | ICD-10-CM | POA: Insufficient documentation

## 2021-10-04 NOTE — Progress Notes (Deleted)
[facility] MD Outpatient Progress Note  10/04/2021 12:00 PM RYLAN KAUFMANN  MRN:  [id number]  Assessment:  Wolfgang Phoenix presents for follow-up evaluation. Today, 10/04/21, patient reports ***  Identifying Information: NILZA EAKER is a 36 y.o. 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   Plan:  # MDD  GAD Past medication trials:  Status of problem: *** Interventions: -- Continue sertraline 150 mg daily -- Continue aripiprazole 10 mg daily -- Medical considerations: patient with Stage 3 CKD: consider renal dosing for indicated medications  # *** Past medication trials:  Status of problem: *** Interventions: -- ***  # *** Past medication trials:  Status of problem: *** Interventions: -- ***  Patient was given contact information for behavioral health clinic and was instructed to call 911 for emergencies.   Subjective:  Chief Complaint: No chief complaint on file.   Interval History: ***  Patient is currently being managed on the following medications:  Aripiprazole 10 mg daily Sertraline 150 mg daily  Legal guardian?  Mood, anxiety Sleep, appetite Day program In July, referred to [employer] at [phone] and spoke to [employer]. Writer and the patient spoke to the intake coordinator and provided the needed information regarding insurance.  The intake worker sent the patient and her mother a secure link to complete the intake process so that the patient can receive services through their Day Program.    Visit Diagnosis: No diagnosis found.  Past Psychiatric History:  Diagnoses: Major depressive disorder,  Generalized anxiety disorder Medication trials: *** Previous psychiatrist/therapist: *** Hospitalizations: *** Suicide attempts: *** SIB: *** Hx of violence towards others: *** Current access to guns: *** Hx of abuse: *** Substance use: ***  Past Medical History:  Past Medical History:  Diagnosis Date   Hypertension    Stroke ([place])    No past surgical history on file.  Family Psychiatric History: Patient is adopted.  Per patient's guardian, patient does not know much about her family psychiatric history.  Family History: No family history on file.  Social History:  Social History   Socioeconomic History   Marital status: Single    Spouse name: Not on file   Number of children: Not on file   Years of education: Not on file   Highest education level: Not on file  Occupational History   Not on file  Tobacco Use   Smoking status: Never   Smokeless tobacco: Never  Vaping Use   Vaping Use: Never used  Substance and Sexual Activity   Alcohol use: No   Drug use: No   Sexual activity: Not on file  Other Topics Concern   Not on file  Social History Narrative   Not on file   Social Determinants of Health   Financial Resource Strain: Not on file  Food Insecurity: Not on file  Transportation Needs: Not on file  Physical Activity: Not on file  Stress: Not on file  Social Connections: Not on file    Allergies:  Allergies  Allergen Reactions   Metformin Diarrhea    Current Medications: Current Outpatient Medications  Medication Sig Dispense Refill   ARIPiprazole (ABILIFY) 10 MG tablet Take 1 tablet (10 mg total) by mouth daily. [place]  tablet 2   Ascorbic Acid 500 MG/5ML LIQD Take by mouth.     Ascorbic Acid 500 MG/5ML SYRP Take by mouth.      aspirin 325 MG EC tablet Take by mouth.     aspirin 325 MG tablet Take 325 mg by mouth daily.      atorvastatin (LIPITOR) 80 MG tablet Take by mouth.     atorvastatin (LIPITOR) 80 MG tablet Take by mouth.     baclofen  (LIORESAL) 10 MG tablet Take 10 mg by mouth 3 (three) times daily.      benzonatate (TESSALON) 200 MG capsule TAKE ONE CAPSULE BY MOUTH THREE TIMES A DAY FOR UP TO 7 DAYS     Blood Glucose Monitoring Suppl (FIFTY50 GLUCOSE METER 2.0) w/Device KIT See admin instructions.     calcium-vitamin D (OSCAL WITH D) 500-200 MG-UNIT TABS tablet Take by mouth.      clopidogrel (PLAVIX) 75 MG tablet Take 75 mg by mouth daily. Patient taking ASA instead     Cysteamine Bitartrate (PROCYSBI) 300 MG PACK Use 1 each as directed.     Docusate Sodium (DSS) 100 MG CAPS Take by mouth.      esomeprazole (NEXIUM) 40 MG capsule Take by mouth.      ferrous sulfate 300 (60 Fe) MG/5ML syrup Take by mouth.      fluticasone (FLONASE) 50 MCG/ACT nasal spray Place into the nose.     glucose blood (PRECISION QID TEST) test strip Use 1 strip as directed.     Incontinence Supply Disposable (UNDERPADS) MISC 1 CASE + 5 PACKS PEACH!     ketoconazole (NIZORAL) 2 % cream Apply topically daily.     medroxyPROGESTERone (DEPO-PROVERA) 150 MG/ML injection Inject into the muscle.      medroxyPROGESTERone Acetate 150 MG/ML SUSY Inject into the muscle.      medroxyPROGESTERone Acetate 150 MG/ML SUSY Inject as directed See admin instructions.     metoprolol tartrate (LOPRESSOR) 25 MG tablet Take by mouth.     nitrofurantoin, macrocrystal-monohydrate, (MACROBID) 100 MG capsule Take 100 mg by mouth every 12 (twelve) hours.     ondansetron (ZOFRAN-ODT) 4 MG disintegrating tablet Take by mouth.      ramipril (ALTACE) 5 MG capsule Take 5 mg by mouth daily.      sertraline (ZOLOFT) 50 MG tablet Take 3 tablets (150 mg total) by mouth daily. 90 tablet 2   Skin Protectants, Misc. (DERMACERIN) CREA Apply topically.      valACYclovir (VALTREX) 500 MG tablet 1 tab by mouth three times daily for 5 days as needed     Vitamin D, Ergocalciferol, (DRISDOL) 1.25 MG (50000 UNIT) CAPS capsule Take 50,000 Units by mouth once a week.     No current  facility-administered medications for this visit.    ROS: Review of Systems  Objective:  Psychiatric Specialty Exam: There were no vitals taken for this visit.There is no height or weight on file to calculate BMI.  General Appearance: Casual  Eye Contact:  Fair  Speech:  Clear and Coherent  Volume:  Normal  Mood:  {BHH MOOD:22306}  Affect:  Appropriate and Calm  Thought Content:  Does not report AVH; no overt delusional thought content on interview    Suicidal Thoughts:  No  Homicidal Thoughts:  No  Thought Process:  Concrete  Orientation:  Full (Time, Place, and Person)    Memory:  {BHH DXAJOI:78676}  Judgment:  {Judgement (PAA):22694}  Insight:  {Insight (PAA):22695}  Concentration:  {Concentration:21399}  Recall:  NA  Fund of Knowledge: {BHH GOOD/FAIR/POOR:22877}  Language: {BHH GOOD/FAIR/POOR:22877}  Psychomotor Activity:  {Psychomotor (PAA):22696}  Akathisia:  Negative  AIMS (if indicated): not done  Assets:  Desire for Improvement Housing Social Support  ADL's:  Impaired at baseline  Cognition: {chl bhh cognition:304700322}  Sleep:  {BHH GOOD/FAIR/POOR:22877}   PE: General: sits comfortably in view of camera; no acute distress *** Pulm: no increased work of breathing on room air *** MSK: all extremity movements appear intact *** Neuro: no focal neurological deficits observed *** Gait & Station: unable to assess by video ***   Metabolic Disorder Labs: No results found for: "HGBA1C", "MPG" No results found for: "PROLACTIN" No results found for: "CHOL", "TRIG", "HDL", "CHOLHDL", "VLDL", "LDLCALC" No results found for: "TSH"  Therapeutic Level Labs: No results found for: "LITHIUM" No results found for: "VALPROATE" No results found for: "CBMZ"  Screenings:  GAD-7    Flowsheet Row Video Visit from 04/14/2021 in [facility]  Total GAD-7 Score 21      PHQ2-9    [place] from 07/29/2021 in [place] from 05/26/2021 in [facility] Video Visit from 04/14/2021 in [facility] Counselor from 04/13/2021 in [facility] Counselor from 02/03/2021 in [place]  PHQ-2 Total Score 2 4 6 6 6   PHQ-9 Total Score 5 19 24 27 15       Flowsheet Row Clinical Support from 05/26/2021 in [facility] Video Visit from 04/14/2021 in [facility] Counselor from 02/03/2021 in [place] No Risk No Risk No Risk       Collaboration of Care: Collaboration of Care: [facility] OP Collaboration of WCHE:[id number]}  Patient/Guardian was advised Release of Information must be obtained prior to any record release in order to collaborate their care with an outside provider. Patient/Guardian was advised if they have not already done so to contact the registration department to sign all necessary forms in order for [place] to release information regarding their care.   Consent: Patient/Guardian gives verbal consent for treatment and assignment of benefits for services provided during this visit. Patient/Guardian expressed understanding and agreed to proceed.   Televisit via video: I connected withNAME@ on 10/04/21 at  9:30 AM EDT by a video enabled telemedicine application and verified that I am speaking with the correct person using two identifiers.  Location: Patient: *** Provider: ***   I discussed the limitations of evaluation and management by telemedicine and the availability of in person appointments. The patient expressed understanding and agreed to proceed.  I discussed the assessment and treatment plan with the patient. The patient was provided an opportunity to ask questions and all were answered. The patient agreed with the plan and demonstrated an understanding of the  instructions.   The patient was advised to call back or seek an in-person evaluation if the symptoms worsen or if the condition fails to improve as anticipated.  I provided *** minutes of non-face-to-face time during this encounter.  Elton Heid A  10/04/2021, 12:00 PM

## 2021-10-05 ENCOUNTER — Emergency Department (HOSPITAL_BASED_OUTPATIENT_CLINIC_OR_DEPARTMENT_OTHER)
Admission: EM | Admit: 2021-10-05 | Discharge: 2021-10-05 | Disposition: A | Discharge status: Home / Self Care | Payer: Medicaid Other | Attending: Emergency Medicine | Admitting: Emergency Medicine

## 2021-10-05 ENCOUNTER — Telehealth (HOSPITAL_COMMUNITY): Payer: Medicaid Other | Admitting: Physician Assistant

## 2021-10-05 ENCOUNTER — Encounter (HOSPITAL_COMMUNITY): Payer: Medicaid Other | Admitting: Psychiatry

## 2021-10-05 ENCOUNTER — Other Ambulatory Visit: Payer: Self-pay

## 2021-10-05 ENCOUNTER — Encounter (HOSPITAL_BASED_OUTPATIENT_CLINIC_OR_DEPARTMENT_OTHER): Payer: Self-pay | Admitting: Emergency Medicine

## 2021-10-05 ENCOUNTER — Encounter (HOSPITAL_COMMUNITY): Payer: Self-pay

## 2021-10-05 DIAGNOSIS — N189 Chronic kidney disease, unspecified: Secondary | ICD-10-CM | POA: Diagnosis not present

## 2021-10-05 DIAGNOSIS — I129 Hypertensive chronic kidney disease with stage 1 through stage 4 chronic kidney disease, or unspecified chronic kidney disease: Secondary | ICD-10-CM | POA: Insufficient documentation

## 2021-10-05 DIAGNOSIS — R0981 Nasal congestion: Secondary | ICD-10-CM | POA: Diagnosis present

## 2021-10-05 DIAGNOSIS — Z7982 Long term (current) use of aspirin: Secondary | ICD-10-CM | POA: Insufficient documentation

## 2021-10-05 DIAGNOSIS — U071 COVID-19: Secondary | ICD-10-CM | POA: Insufficient documentation

## 2021-10-05 DIAGNOSIS — Z79899 Other long term (current) drug therapy: Secondary | ICD-10-CM | POA: Diagnosis not present

## 2021-10-05 LAB — SARS CORONAVIRUS 2 BY RT PCR: SARS Coronavirus 2 by RT PCR: POSITIVE — AB

## 2021-10-05 MED ORDER — MOLNUPIRAVIR EUA 200MG CAPSULE: 4.0000 | ORAL_CAPSULE | Freq: Two times a day (BID) | ORAL | 0 refills | Status: AC

## 2021-10-05 NOTE — Discharge Instructions (Signed)
Your exam today was overall reassuring.  You are COVID-positive.  Given your other medical history have given you prescription for molnupiravir.  Please follow-up with your primary care provider.  For any concerning symptoms such as worsening shortness of breath, chest pain please return to the emergency room for evaluation.  Take Tylenol or ibuprofen as you need to for muscle aches, fever.  You can use cough drops, sinus rinse for symptoms.

## 2021-10-05 NOTE — ED Provider Notes (Signed)
[facility] EMERGENCY DEPARTMENT Provider Note   CSN: [id number] Arrival date & time: 10/05/21  1738     History  Chief Complaint  Patient presents with   Nasal Congestion    Stacey [name] is a 36 y.o. female.  36 year old female with history of CVA, CKD, hypertension presents today for evaluation of URI symptoms since last night.  Patient was recently had a birthday get together on Saturday.  She denies fever, chills.  Endorses congestion, cough.  Denies shortness of breath, chest pain.  She has not taken anything prior to arrival.  The history is provided by the patient. No language interpreter was used.       Home Medications Prior to Admission medications   Medication Sig Start Date End Date Taking? Authorizing Provider  ARIPiprazole (ABILIFY) 10 MG tablet Take 1 tablet (10 mg total) by mouth daily. 07/29/21   Freida Busman, MD  Ascorbic Acid 500 MG/5ML LIQD Take by mouth. 03/05/09   [provider]  Ascorbic Acid 500 MG/5ML SYRP Take by mouth.  03/05/09   [provider]  aspirin 325 MG EC tablet Take by mouth. 04/27/15   [provider]  aspirin 325 MG tablet Take 325 mg by mouth daily.     [provider]  atorvastatin (LIPITOR) 80 MG tablet Take by mouth. 05/15/20   [provider]  atorvastatin (LIPITOR) 80 MG tablet Take by mouth. 06/29/20   [provider]  baclofen (LIORESAL) 10 MG tablet Take 10 mg by mouth 3 (three) times daily.     [provider]  benzonatate (TESSALON) 200 MG capsule TAKE ONE CAPSULE BY MOUTH THREE TIMES A DAY FOR UP TO 7 DAYS 03/28/18   [provider]  Blood Glucose Monitoring Suppl (FIFTY50 GLUCOSE METER 2.0) w/Device KIT See admin instructions. 11/09/18   [provider]  calcium-vitamin D (OSCAL WITH D) 500-200 MG-UNIT TABS tablet Take by mouth.  04/18/09   [provider]  clopidogrel (PLAVIX) 75 MG tablet Take 75 mg by mouth daily. Patient  taking ASA instead    [provider]  Cysteamine Bitartrate (PROCYSBI) 300 MG PACK Use 1 each as directed. 11/09/18   [provider]  Docusate Sodium (DSS) 100 MG CAPS Take by mouth.  03/05/09   [provider]  esomeprazole (NEXIUM) 40 MG capsule Take by mouth.  03/05/09   [provider]  ferrous sulfate 300 (60 Fe) MG/5ML syrup Take by mouth.  03/05/09   [provider]  fluticasone (FLONASE) 50 MCG/ACT nasal spray Place into the nose. 11/15/19 11/14/20  [provider]  glucose blood (PRECISION QID TEST) test strip Use 1 strip as directed. 11/09/18   [provider]  Incontinence Supply Disposable (UNDERPADS) MISC 1 CASE + 5 PACKS PEACH! 06/07/13   [provider]  ketoconazole (NIZORAL) 2 % cream Apply topically daily. 08/07/20   [provider]  medroxyPROGESTERone (DEPO-PROVERA) 150 MG/ML injection Inject into the muscle.  10/20/16 03/28/20  [provider]  medroxyPROGESTERone Acetate 150 MG/ML SUSY Inject into the muscle.  09/10/19   [provider]  medroxyPROGESTERone Acetate 150 MG/ML SUSY Inject as directed See admin instructions. 12/10/19   [provider]  metoprolol tartrate (LOPRESSOR) 25 MG tablet Take by mouth. 05/15/20   [provider]  nitrofurantoin, macrocrystal-monohydrate, (MACROBID) 100 MG capsule Take 100 mg by mouth every 12 (twelve) hours. 12/16/20   [provider]  ondansetron (ZOFRAN-ODT) 4 MG disintegrating tablet Take by  mouth.  03/05/09   [provider]  ramipril (ALTACE) 5 MG capsule Take 5 mg by mouth daily.     [provider]  sertraline (ZOLOFT) 50 MG tablet Take 3 tablets (150 mg total) by mouth daily. 07/29/21   Freida Busman, MD  Skin Protectants, Misc. (DERMACERIN) CREA Apply topically.  04/18/09   [provider]  valACYclovir (VALTREX) 500 MG tablet 1 tab by mouth three times daily for 5 days as needed 01/07/11    [provider]  Vitamin D, Ergocalciferol, (DRISDOL) 1.25 MG (50000 UNIT) CAPS capsule Take 50,000 Units by mouth once a week. 12/30/20   [provider]      Allergies    Metformin    Review of Systems   Review of Systems  Constitutional:  Negative for chills and fever.  HENT:  Negative for congestion.   Respiratory:  Positive for cough. Negative for shortness of breath.   Cardiovascular:  Negative for chest pain.  Neurological:  Negative for light-headedness.  All other systems reviewed and are negative.   Physical Exam Updated Vital Signs BP 105/63 (BP Location: Left Arm)   Pulse 91   Temp 99.3 F (37.4 C) (Oral)   Resp 20   Ht _0  (1.702 m)   Wt 84.8 kg   SpO2 99%   BMI 29.29 kg/m  Physical Exam Vitals and nursing note reviewed.  Constitutional:      General: She is not in acute distress.    Appearance: Normal appearance. She is not ill-appearing.  HENT:     Head: Normocephalic and atraumatic.     Nose: Nose normal.  Eyes:     Conjunctiva/sclera: Conjunctivae normal.  Cardiovascular:     Rate and Rhythm: Normal rate and regular rhythm.     Pulses: Normal pulses.  Pulmonary:     Effort: Pulmonary effort is normal. No respiratory distress.     Breath sounds: Normal breath sounds. No wheezing or rales.  Abdominal:     General: There is no distension.     Palpations: Abdomen is soft.     Tenderness: There is no abdominal tenderness. There is no guarding.  Musculoskeletal:        General: No deformity. Normal range of motion.     Cervical back: Normal range of motion.  Skin:    Findings: No rash.  Neurological:     Mental Status: She is alert.     ED Results / Procedures / Treatments   Labs (all labs ordered are listed, but only abnormal results are displayed) Labs Reviewed  SARS CORONAVIRUS 2 BY RT PCR - Abnormal; Notable for the following components:      Result Value   SARS Coronavirus 2 by RT PCR POSITIVE (*)    All other  components within normal limits    EKG None  Radiology No results found.  Procedures Procedures    Medications Ordered in ED Medications - No data to display  ED Course/ Medical Decision Making/ A&P                           Medical Decision Making  36 year old female with above-mentioned history presents today for URI symptoms of 1 day duration.  Patient tested positive for COVID.  Low suspicion for pneumonia as she is without shortness of breath, and SPO2 on room air of 99%.  Patient does have risk factors so will prescribe molnupiravir.  Strict return precaution  discussed.  Patient voices understanding and is in agreement with plan.  Her friend is also at bedside who lives next door to the patient who helps look after her.  Patient is appropriate for discharge.  Discharged in stable condition.  Return precaution discussed.  They voiced understanding and are in agreement with plan.   Final Clinical Impression(s) / ED Diagnoses Final diagnoses:  COVID-19    Rx / DC Orders ED Discharge Orders          Ordered    molnupiravir EUA (LAGEVRIO) 200 mg CAPS capsule  2 times daily        10/05/21 2203              Evlyn Courier, PA-C 10/05/21 2203    Gareth Morgan, MD 10/06/21 1135

## 2021-10-05 NOTE — ED Triage Notes (Signed)
Productive cough and nasal congestion since last night.

## 2021-10-12 ENCOUNTER — Ambulatory Visit: Payer: Medicaid Other | Admitting: Podiatry

## 2021-11-11 ENCOUNTER — Ambulatory Visit (INDEPENDENT_AMBULATORY_CARE_PROVIDER_SITE_OTHER): Payer: Medicaid Other | Admitting: Podiatry

## 2021-11-11 DIAGNOSIS — Q828 Other specified congenital malformations of skin: Secondary | ICD-10-CM | POA: Diagnosis not present

## 2021-11-11 DIAGNOSIS — M79675 Pain in left toe(s): Secondary | ICD-10-CM

## 2021-11-11 DIAGNOSIS — L84 Corns and callosities: Secondary | ICD-10-CM

## 2021-11-11 DIAGNOSIS — M79674 Pain in right toe(s): Secondary | ICD-10-CM | POA: Diagnosis not present

## 2021-11-11 DIAGNOSIS — B351 Tinea unguium: Secondary | ICD-10-CM | POA: Diagnosis not present

## 2021-11-11 DIAGNOSIS — R7303 Prediabetes: Secondary | ICD-10-CM

## 2021-11-11 NOTE — Progress Notes (Signed)
  Subjective:  Patient ID: Stacey [name], female    DOB: [birth year],  MRN: [id number]  Chief Complaint  Patient presents with   Nail Problem    RFC    36 y.o. female presents with the above complaint. History confirmed with patient. Patient presenting with pain related to dystrophic thickened elongated nails. Patient is unable to trim own nails related to nail dystrophy and/or mobility issues. Patient does not have a history of T2DM. Patient does/does not have callus present located at the bilateral great toe and plantar foot sub 5th met left foot and sub 2nd met head right foot causing pain.   Objective:  Physical Exam: warm, good capillary refill nail exam onychomycosis of the toenails, onycholysis, and dystrophic nails DP pulses palpable, PT pulses palpable, and protective sensation intact Left Foot:  Pain with palpation of nails due to elongation and dystrophic growth.  Right Foot: Pain with palpation of nails due to elongation and dystrophic growth.   Assessment:   1. Pain due to onychomycosis of toenails of both feet   2. Porokeratosis   3. Callus   4. Prediabetes      Plan:  Patient was evaluated and treated and all questions answered.  #Hyperkeratotic lesions/pre ulcerative calluses present bilateral medial aspect hallux IPJ, sub 5th met head left foot, sub 2nd met head right foot All symptomatic hyperkeratoses x 4 separate lesions were safely debrided with a sterile #10 blade to patient's level of comfort without incident. We discussed preventative and palliative care of these lesions including supportive and accommodative shoegear, padding, prefabricated and custom molded accommodative orthoses, use of a pumice stone and lotions/creams daily.  #Onychomycosis with pain  -Nails palliatively debrided as below. -Educated on self-care  Procedure: Nail Debridement Rationale: Pain Type of Debridement: manual, sharp debridement. Instrumentation: Nail nipper, rotary  burr. Number of Nails: 10  Return in about 3 months (around 02/11/2022) for RFC.         Everitt Amber, DPM [facility]

## 2021-11-19 ENCOUNTER — Encounter (HOSPITAL_COMMUNITY): Payer: Self-pay | Admitting: Psychiatry

## 2021-11-19 ENCOUNTER — Telehealth (INDEPENDENT_AMBULATORY_CARE_PROVIDER_SITE_OTHER): Payer: Medicaid Other | Admitting: Psychiatry

## 2021-11-19 DIAGNOSIS — F411 Generalized anxiety disorder: Secondary | ICD-10-CM

## 2021-11-19 DIAGNOSIS — F339 Major depressive disorder, recurrent, unspecified: Secondary | ICD-10-CM | POA: Diagnosis not present

## 2021-11-19 DIAGNOSIS — G3184 Mild cognitive impairment, so stated: Secondary | ICD-10-CM | POA: Diagnosis not present

## 2021-11-19 MED ORDER — ARIPIPRAZOLE 10 MG PO TABS: 10.0000 mg | ORAL_TABLET | Freq: Every day | ORAL | 1 refills | Status: DC

## 2021-11-19 MED ORDER — SERTRALINE HCL 50 MG PO TABS: 150.0000 mg | ORAL_TABLET | Freq: Every day | ORAL | 1 refills | Status: DC

## 2021-11-19 NOTE — Patient Instructions (Signed)
Thank you for attending your appointment today.  -- We did not make any medication changes today. Please continue medications as prescribed.  Please do not make any changes to medications without first discussing with your provider. If you are experiencing a psychiatric emergency, please call 911 or present to your nearest emergency department. Additional crisis, medication management, and therapy resources are included below.  [facility]  [address], [place], [place] [address] [phone] WALK-IN URGENT CARE 24/7 FOR ANYONE [address], , [place]  [phone] Fax: [phone] [email or web address] *Interpreters available *Accepts all insurance and uninsured for Urgent Care needs *Accepts Medicaid and uninsured for outpatient treatment (below)      ONLY FOR [place] Residents  Below:    Outpatient New Patient Assessment/Therapy Walk-ins:        Monday -Thursday 8am until slots are full.        Every Friday 1pm-4pm  (first come, first served)                   New Patient Psychiatry/Medication Management        Monday-Friday 8am-11am (first come, first served)               For all walk-ins we ask that you arrive by 7:15am, because patients will be seen in the order of arrival.   

## 2021-11-19 NOTE — Progress Notes (Signed)
[name]  11/19/2021 12:01 PM Stacey [name]  MRN:  [id number]  Assessment:  Stacey [name] presents for follow-up evaluation. Today, 11/19/21, patient and mother report overall stability of mood and behaviors on current regimen. Mom's primary focus remains identifying a day program and increased home support; strongly agree that Stacey [name] would benefit from behavioral activation and socialization during the day. Provided with numbers for local resources and mom is coordinating with Medicaid case worker as well. No changes to plan of care at this time; RTC in 2.5 months.   Identifying Information: Stacey [name] is a 36 y.o. 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   Plan:  # MDD  GAD  Mild cognitive impairment 2/2 CVA Past medication trials: unknown Status of problem: chronic, stable Interventions: -- Continue Abilify 10 mg daily -- Continue sertraline 150 mg daily   ** mom prefers 90d supply of medications -- Mom currently coordinating with [facility] (provided with number again today - [phone]) as well as Medicaid caseworker to inquire about day programs; information for [facility] provided to inquire about social programming  # Medication monitoring Interventions: -- Lipid profile 01/11/21 revealing for low HDL; Hgb A1c 04/29/21 6.4 (prediabetes); followed by PCP  Patient's guardian, Stacey [name], provided consent and patient provided assent to above medications.   Patient was given contact information for behavioral health clinic and was instructed to call 911 for emergencies.    Subjective:  Chief Complaint:  Chief Complaint  Patient presents with   Medication Management    Interval History:   Last seen by Damita Dunnings, MD on 07/29/21. At that time, managed on:  Aripiprazole 10 mg daily Sertraline 150 mg daily   During that visit, no changes to medications made. Focus was on exploring options for day program; case management referred patient to  [employer]  Today, patient's adoptive mother, Stacey [name], is present for visit.   Stacey [name] reports she is doing "good" and denies any complaints or concerns. Denies feeling down or depressed or anxious. Spends her days mostly sleeping. States sleep and appetite are good. Denies SI, HI. Continues to take medications as prescribed and denies adverse effects.   Mom feels Stacey [name] has been doing overall well; some episodes of irritability but not significantly so. Spends a lot of the day sleeping or watching TV. Mom reports she had looked into day programs but never heard back. Was referred to [facility] and came to home and did assessment; however program was for only a few hours per day.  Recently identified case worker through [place] and trying to work on getting home health aide and day program. Seen by primary care at [facility] and plans to talk about options for physical therapy.  Provided information for [facility] to inquire about levels of programming available. Mom is part of Therapist, occupational and is also searching for resources through that program.   Visit Diagnosis:    ICD-10-CM   1. Recurrent major depressive disorder, remission status unspecified (HCC)  F33.9 ARIPiprazole (ABILIFY) 10 MG tablet    sertraline (ZOLOFT) 50 MG tablet    2. GAD (generalized anxiety disorder)  F41.1 sertraline (ZOLOFT) 50 MG tablet  3. Mild cognitive impairment  G31.84       Past Psychiatric History:  Diagnoses: major depressive disorder, generalized anxiety disorder,  mild cognitive impairment 2/2 cerebral infarction in 2011  Past Medical History:  Past Medical History:  Diagnosis Date   Hypertension    Stroke [facility])    History reviewed. No pertinent surgical history.  Family Psychiatric History:  Patient is adopted.  Patient's guardian unsure of patient's family psychiatric history.  Family History: History reviewed. No pertinent family history.  Social History:  Social History   Socioeconomic History   Marital status: Single    Spouse name: Not on file   Number of children: Not on file   Years of education: Not on file   Highest education level: Not on file  Occupational History   Not on file  Tobacco Use   Smoking status: Never   Smokeless tobacco: Never  Vaping Use   Vaping Use: Never used  Substance and Sexual Activity   Alcohol use: No   Drug use: No   Sexual activity: Not on file  Other Topics Concern   Not on file  Social History Narrative   Not on file   Social Determinants of Health   Financial Resource Strain: Not on file  Food Insecurity: Not on file  Transportation Needs: Not on file  Physical Activity: Not on file  Stress: Not on file  Social Connections: Not on file    Allergies:  Allergies  Allergen Reactions   Metformin Diarrhea and Other (See Comments)    Current Medications: Current Outpatient Medications  Medication Sig Dispense Refill   ARIPiprazole (ABILIFY) 10 MG tablet Take 1 tablet (10 mg total) by mouth daily. 90 tablet 1   Ascorbic Acid 500 MG/5ML LIQD Take by mouth.     Ascorbic Acid 500 MG/5ML SYRP Take by mouth.      aspirin 325 MG EC tablet Take by mouth.     aspirin 325 MG tablet Take 325 mg by mouth daily.      atorvastatin (LIPITOR) 80 MG tablet Take by mouth.     atorvastatin (LIPITOR) 80 MG tablet Take by mouth.     baclofen (LIORESAL) 10 MG tablet Take 10 mg by mouth 3 (three) times daily.      benzonatate (TESSALON) 200 MG capsule TAKE ONE CAPSULE BY MOUTH THREE TIMES A DAY FOR  UP TO 7 DAYS     Blood Glucose Monitoring Suppl (FIFTY50 GLUCOSE METER 2.0) w/Device KIT See admin instructions.     calcium-vitamin D (OSCAL WITH D) 500-200 MG-UNIT TABS tablet Take by mouth.      clopidogrel (PLAVIX) 75 MG tablet Take 75 mg by mouth daily. Patient taking ASA instead     Cysteamine Bitartrate (PROCYSBI) 300 MG PACK Use 1 each as directed.     Docusate Sodium (DSS) 100 MG CAPS Take by mouth.      esomeprazole (NEXIUM) 40 MG capsule Take by mouth.      ferrous sulfate 300 (60 Fe) MG/5ML syrup Take by mouth.      fluticasone (FLONASE) 50 MCG/ACT nasal spray Place into the nose.     glucose blood (PRECISION QID TEST) test strip Use 1 strip as directed.     Incontinence Supply Disposable (UNDERPADS) MISC 1 CASE + 5 PACKS PEACH!     ketoconazole (NIZORAL) 2 % cream Apply topically daily.     medroxyPROGESTERone (DEPO-PROVERA) 150 MG/ML injection Inject into the muscle.      medroxyPROGESTERone Acetate 150  MG/ML SUSY Inject into the muscle.      medroxyPROGESTERone Acetate 150 MG/ML SUSY Inject as directed See admin instructions.     metoprolol tartrate (LOPRESSOR) 25 MG tablet Take by mouth.     nitrofurantoin, macrocrystal-monohydrate, (MACROBID) 100 MG capsule Take 100 mg by mouth every 12 (twelve) hours.     ondansetron (ZOFRAN-ODT) 4 MG disintegrating tablet Take by mouth.      ramipril (ALTACE) 5 MG capsule Take 5 mg by mouth daily.      sertraline (ZOLOFT) 50 MG tablet Take 3 tablets (150 mg total) by mouth daily. 270 tablet 1   Skin Protectants, Misc. (DERMACERIN) CREA Apply topically.      valACYclovir (VALTREX) 500 MG tablet 1 tab by mouth three times daily for 5 days as needed     Vitamin D, Ergocalciferol, (DRISDOL) 1.25 MG (50000 UNIT) CAPS capsule Take 50,000 Units by mouth once a week.     No current facility-administered medications for this visit.    ROS: Denies any physical complaints  Objective:  Psychiatric Specialty Exam: There were no vitals taken for  this visit.There is no height or weight on file to calculate BMI.  General Appearance: Casual and Fairly Groomed  Eye Contact:  Fair  Speech:   Brief but coherent  Volume:  Normal  Mood:   "good"  Affect:   Euthymic; calm; smiling  Thought Content:  Does not endorse AVH    Suicidal Thoughts:  No  Homicidal Thoughts:  No  Thought Process:  Concrete  Orientation:  Other:  Grossly intact    Memory:   Grossly intact  Judgment:  Fair  Insight:   Limited  Concentration:  Concentration: Fair  Recall:  NA  Fund of Knowledge:  Limited  Language: Fair  Psychomotor Activity:  Normal  Akathisia:  No  AIMS (if indicated): not done  Assets:  Desire for Improvement Housing Leisure Time Social Support Transportation  ADL's:  Impaired at baseline  Cognition: Impaired,  Mild  Sleep:  Good   PE: General: sits comfortably in view of camera; no acute distress  Pulm: no increased work of breathing on room air  MSK: all extremity movements appear intact  Neuro: no focal neurological deficits observed  Gait & Station: unable to assess by video    Metabolic Disorder Labs: No results found for: "HGBA1C", "MPG" No results found for: "PROLACTIN" No results found for: "CHOL", "TRIG", "HDL", "CHOLHDL", "VLDL", "LDLCALC" No results found for: "TSH"  Therapeutic Level Labs: No results found for: "LITHIUM" No results found for: "VALPROATE" No results found for: "CBMZ"  Screenings:  GAD-7    Flowsheet Row Video Visit from 04/14/2021 in [facility]  Total GAD-7 Score 21      PHQ2-9    [place] from 07/29/2021 in [facility] from 05/26/2021 in [facility] Video Visit from 04/14/2021 in [facility] Counselor from 04/13/2021 in [facility] Counselor from 02/03/2021 in [facility]  PHQ-2  Total Score _0 PHQ-9 Total Score _1 [facility] ED from 10/05/2021 in [facility] from 05/26/2021 in [facility] Video Visit from 04/14/2021 in [facility] No Risk No Risk No Risk       Collaboration of Care:  Collaboration of Care: Medication Management AEB ongoing medication management and Psychiatrist AEB established with this provider  Patient/Guardian was advised Release of Information must be obtained prior to any record release in order to collaborate their care with an outside provider. Patient/Guardian was advised if they have not already done so to contact the registration department to sign all necessary forms in order for [place] to release information regarding their care.   Consent: Patient/Guardian gives verbal consent for treatment and assignment of benefits for services provided during this visit. Patient/Guardian expressed understanding and agreed to proceed.   Televisit via video: I connected with patient on 11/19/21 at 11:00 AM EST by a video enabled telemedicine application and verified that I am speaking with the correct person using two identifiers.  Location: Patient: home address in [place] Provider: remote office in [place]   I discussed the limitations of evaluation and management by telemedicine and the availability of in person appointments. The patient expressed understanding and agreed to proceed.  I discussed the assessment and treatment plan with the patient. The patient was provided an opportunity to ask questions and all were answered. The patient agreed with the plan and demonstrated an understanding of the instructions.   The patient was advised to call back or seek an in-person evaluation if the symptoms worsen or if the condition fails to improve as anticipated.  I provided 45 minutes of non-face-to-face time during  this encounter.  Luman Holway A  11/19/2021, 12:01 PM

## 2021-11-30 DIAGNOSIS — F32A Depression, unspecified: Secondary | ICD-10-CM | POA: Insufficient documentation

## 2022-01-24 ENCOUNTER — Telehealth (HOSPITAL_COMMUNITY): Payer: Medicaid Other | Admitting: Psychiatry

## 2022-01-26 ENCOUNTER — Telehealth (HOSPITAL_COMMUNITY): Payer: Medicaid Other | Admitting: Physician Assistant

## 2022-01-26 ENCOUNTER — Ambulatory Visit: Payer: Medicaid Other | Admitting: Podiatry

## 2022-01-28 ENCOUNTER — Ambulatory Visit (HOSPITAL_COMMUNITY): Payer: Medicaid Other | Admitting: Physician Assistant

## 2022-02-02 ENCOUNTER — Ambulatory Visit (INDEPENDENT_AMBULATORY_CARE_PROVIDER_SITE_OTHER): Payer: Medicaid Other | Admitting: Podiatry

## 2022-02-02 VITALS — BP 125/76

## 2022-02-02 DIAGNOSIS — G629 Polyneuropathy, unspecified: Secondary | ICD-10-CM | POA: Diagnosis not present

## 2022-02-02 DIAGNOSIS — M79675 Pain in left toe(s): Secondary | ICD-10-CM

## 2022-02-02 DIAGNOSIS — L84 Corns and callosities: Secondary | ICD-10-CM

## 2022-02-02 DIAGNOSIS — Q828 Other specified congenital malformations of skin: Secondary | ICD-10-CM

## 2022-02-02 DIAGNOSIS — B351 Tinea unguium: Secondary | ICD-10-CM | POA: Diagnosis not present

## 2022-02-02 DIAGNOSIS — M79674 Pain in right toe(s): Secondary | ICD-10-CM | POA: Diagnosis not present

## 2022-02-04 ENCOUNTER — Ambulatory Visit (INDEPENDENT_AMBULATORY_CARE_PROVIDER_SITE_OTHER): Payer: Medicaid Other | Admitting: Physician Assistant

## 2022-02-04 ENCOUNTER — Encounter (HOSPITAL_COMMUNITY): Payer: Self-pay | Admitting: Physician Assistant

## 2022-02-04 DIAGNOSIS — F339 Major depressive disorder, recurrent, unspecified: Secondary | ICD-10-CM

## 2022-02-04 DIAGNOSIS — F411 Generalized anxiety disorder: Secondary | ICD-10-CM | POA: Diagnosis not present

## 2022-02-04 MED ORDER — SERTRALINE HCL 50 MG PO TABS: 150.0000 mg | ORAL_TABLET | Freq: Every day | ORAL | 1 refills | Status: DC

## 2022-02-04 MED ORDER — ARIPIPRAZOLE 10 MG PO TABS: 10.0000 mg | ORAL_TABLET | Freq: Every day | ORAL | 1 refills | Status: DC

## 2022-02-04 NOTE — Progress Notes (Unsigned)
[facility] MD/PA/NP OP Progress Note  02/04/2022 7:46 PM Stacey [name]  MRN:  [id number]  Chief Complaint:  Chief Complaint  Patient presents with   Follow-up   Medication Refill   HPI: ***  Stacey [name]  Visit Diagnosis:    ICD-10-CM   1. GAD (generalized anxiety disorder)  F41.1 sertraline (ZOLOFT) 50 MG tablet    2. Recurrent major depressive disorder, remission status unspecified (HCC)  F33.9 sertraline (ZOLOFT) 50 MG tablet    ARIPiprazole (ABILIFY) 10 MG tablet      Past Psychiatric History:  Diagnoses: major depressive disorder, generalized anxiety disorder, mild cognitive impairment 2/2 cerebral infarction in 2011   Past Medical History:  Past Medical History:  Diagnosis Date   Hypertension    Stroke [facility])    History reviewed. No pertinent surgical history.  Family Psychiatric History:  Patient is adopted.  Per patient's guardian, patient does not know too much about her family psychiatric history   Family History: History reviewed. No pertinent family history.  Social History:  Social History   Socioeconomic History   Marital status: Single    Spouse name: Not on file   Number of children: Not on file   Years of education: Not on file   Highest education level: Not on file  Occupational History   Not on file  Tobacco Use   Smoking status: Never   Smokeless tobacco: Never  Vaping Use   Vaping Use: Never used  Substance and Sexual Activity   Alcohol use: No   Drug use: No   Sexual activity: Not on file  Other Topics Concern   Not on file  Social History Narrative   Not on file   Social Determinants of Health   Financial Resource Strain: Not on file  Food Insecurity: Not on file  Transportation Needs: Not on file  Physical Activity: Not on file  Stress: Not on file  Social Connections: Not on file    Allergies:  Allergies  Allergen Reactions   Metformin Diarrhea and Other (See Comments)    Metabolic Disorder Labs: No results found  for: "HGBA1C", "MPG" No results found for: "PROLACTIN" No results found for: "CHOL", "TRIG", "HDL", "CHOLHDL", "VLDL", "LDLCALC" No results found for: "TSH"  Therapeutic Level Labs: No results found for: "LITHIUM" No results found for: "VALPROATE" No results found for: "CBMZ"  Current Medications: Current Outpatient Medications  Medication Sig Dispense Refill   ARIPiprazole (ABILIFY) 10 MG tablet Take 1 tablet (10 mg total) by mouth daily. 90 tablet 1   Ascorbic Acid 500 MG/5ML LIQD Take by mouth.     Ascorbic Acid 500 MG/5ML SYRP Take by mouth.      aspirin 325 MG EC tablet Take by mouth.     aspirin 325 MG tablet Take 325 mg by mouth daily.      atorvastatin (LIPITOR) 80 MG tablet Take by mouth.     atorvastatin (LIPITOR) 80 MG tablet Take by mouth.     baclofen (LIORESAL) 10 MG tablet Take 10 mg by mouth 3 (three) times daily.      benzonatate (TESSALON) 200 MG capsule TAKE ONE CAPSULE BY MOUTH THREE TIMES A DAY FOR UP TO 7 DAYS     Blood Glucose Monitoring Suppl (FIFTY50 GLUCOSE METER 2.0) w/Device KIT See admin instructions.     calcium-vitamin D (OSCAL WITH D) 500-200 MG-UNIT TABS tablet Take by mouth.      clopidogrel (PLAVIX) 75 MG tablet Take 75 mg by mouth daily. Patient  taking ASA instead     Cysteamine Bitartrate (PROCYSBI) 300 MG PACK Use 1 each as directed.     Docusate Sodium (DSS) 100 MG CAPS Take by mouth.      esomeprazole (NEXIUM) 40 MG capsule Take by mouth.      ferrous sulfate 300 (60 Fe) MG/5ML syrup Take by mouth.      fluticasone (FLONASE) 50 MCG/ACT nasal spray Place into the nose.     glucose blood (PRECISION QID TEST) test strip Use 1 strip as directed.     Incontinence Supply Disposable (UNDERPADS) MISC 1 CASE + 5 PACKS PEACH!     ketoconazole (NIZORAL) 2 % cream Apply topically daily.     medroxyPROGESTERone (DEPO-PROVERA) 150 MG/ML injection Inject into the muscle.      medroxyPROGESTERone Acetate 150 MG/ML SUSY Inject into the muscle.       medroxyPROGESTERone Acetate 150 MG/ML SUSY Inject as directed See admin instructions.     metoprolol tartrate (LOPRESSOR) 25 MG tablet Take by mouth.     nitrofurantoin, macrocrystal-monohydrate, (MACROBID) 100 MG capsule Take 100 mg by mouth every 12 (twelve) hours.     ondansetron (ZOFRAN-ODT) 4 MG disintegrating tablet Take by mouth.      ramipril (ALTACE) 5 MG capsule Take 5 mg by mouth daily.      sertraline (ZOLOFT) 50 MG tablet Take 3 tablets (150 mg total) by mouth daily. 270 tablet 1   Skin Protectants, Misc. (DERMACERIN) CREA Apply topically.      valACYclovir (VALTREX) 500 MG tablet 1 tab by mouth three times daily for 5 days as needed     Vitamin D, Ergocalciferol, (DRISDOL) 1.25 MG (50000 UNIT) CAPS capsule Take 50,000 Units by mouth once a week.     No current facility-administered medications for this visit.     Musculoskeletal: Strength & Muscle Tone: decreased, patient has a past history of stroke with some lingering effects present on the right side of her body.  Gait & Station:  Patient utilizes a cane and wheelchair to get ambulate Patient leans: N/A  Psychiatric Specialty Exam: Review of Systems  Psychiatric/Behavioral:  Negative for decreased concentration, dysphoric mood, hallucinations, self-injury, sleep disturbance and suicidal ideas. The patient is nervous/anxious. The patient is not hyperactive.     There were no vitals taken for this visit.There is no height or weight on file to calculate BMI.  General Appearance: Casual  Eye Contact:  Good  Speech:  Clear and Coherent and Normal Rate  Volume:  Normal  Mood:  Euthymic  Affect:  Appropriate  Thought Process:  Coherent and Descriptions of Associations: Intact  Orientation:  Full (Time, Place, and Person)  Thought Content: WDL   Suicidal Thoughts:  No  Homicidal Thoughts:  No  Memory:  Immediate;   Good Recent;   Fair Remote;   Fair  Judgement:  Fair  Insight:  Fair  Psychomotor Activity:  Normal   Concentration:  Concentration: Fair and Attention Span: Fair  Recall:  [employer] of Knowledge: Fair  Language: Fair  Akathisia:  No  Handed:  Left  AIMS (if indicated): not done  Assets:  Communication Skills Desire for Improvement Housing Social Support  ADL's:  Impaired  Cognition: Impaired,  Mild  Sleep:  Good   Screenings: GAD-7    Flowsheet Row Office Visit from 02/04/2022 in [facility] from [facility]14/2021 in [facility]  Total GAD-7 Score 12 21      PHQ2-9  [place] Office Visit from 02/04/2022 in [place] from 07/29/2021 in [facility] Clinical Support from 05/26/2021 in [facility] Video Visit from 04/14/2021 in [facility] Counselor from 04/13/2021 in [place]  PHQ-2 Total Score 4 2 4 6 6   PHQ-9 Total Score 13 5 19 24 27       [place] Office Visit from 02/04/2022 in [facility] ED from 10/05/2021 in [facility] Emergency Department at [place] from 05/26/2021 in [place] No Risk No Risk No Risk        Assessment and Plan: ***    Collaboration of Care: Collaboration of Care: Medication Management AEB provider managing patient's psychiatric medications, Primary Care Provider AEB patient being seen by primary care provider, and Psychiatrist AEB patient being followed by mental health provider  Patient/Guardian was advised Release of Information must be obtained prior to any record release in order to collaborate their care with an outside provider. Patient/Guardian was advised if they have not already done so to contact the registration department to sign all necessary forms in order for [place] to release information regarding  their care.   Consent: Patient/Guardian gives verbal consent for treatment and assignment of benefits for services provided during this visit. Patient/Guardian expressed understanding and agreed to proceed.   1. GAD (generalized anxiety disorder)  - sertraline (ZOLOFT) 50 MG tablet; Take 3 tablets (150 mg total) by mouth daily.  Dispense: 270 tablet; Refill: 1  2. Recurrent major depressive disorder, remission status unspecified (HCC)  - sertraline (ZOLOFT) 50 MG tablet; Take 3 tablets (150 mg total) by mouth daily.  Dispense: 270 tablet; Refill: 1 - ARIPiprazole (ABILIFY) 10 MG tablet; Take 1 tablet (10 mg total) by mouth daily.  Dispense: 90 tablet; Refill: 1  Patient to follow-up in 2 months Provider spent a total of 30 minutes with the patient/reviewing patient's chart  Malachy Mood, PA 02/04/2022, 7:46 PM

## 2022-02-06 ENCOUNTER — Encounter: Payer: Self-pay | Admitting: Podiatry

## 2022-02-06 NOTE — Progress Notes (Signed)
  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 Patient is on long term blood thinner, Plavix, and painful porokeratotic lesion(s) b/l feet and painful mycotic toenails that limit ambulation. Painful toenails interfere with ambulation. Aggravating factors include wearing enclosed shoe gear. Pain is relieved with periodic professional debridement. Painful porokeratotic lesions are aggravated when weightbearing with and without shoegear. Pain is relieved with periodic professional debridement.   Chief Complaint  Patient presents with   Nail Problem     Routine foot care   Patient's mother is present during today's visit. She is being worked up for chronic swelling of RLE. This is her affected side s/p CVA. She has had venous dopplers performed and they have been negative. She states leg is tender to touch.  PCP is Bonsu, Osei A, DO.  Allergies  Allergen Reactions   Metformin Diarrhea and Other (See Comments)   Review of Systems: Negative except as noted in the HPI.  Objective: No changes noted in today's physical examination. Vitals:   02/02/22 1507  BP: 125/76   Stacey [name] is a pleasant 37 y.o. female obese in NAD. AAO x 3. Constitutional Stacey [name] is a pleasant 37 y.o. African American female, in NAD. AAO x 3.   Vascular CFT immediate b/l LE. Palpable DP/PT pulses b/l LE. Digital hair absent b/l. Skin temperature gradient WNL b/l. No pain with calf compression b/l. Right LE with edema noted from ankle proximally with sensitivity. There is postinflammatory hyperpigmentation.  Neurologic Normal speech. Oriented to person, place, and time. Protective sensation diminished with 10g monofilament b/l.  Dermatologic Pedal integument with normal turgor, texture and tone b/l LE. No open wounds b/l. No interdigital macerations b/l. Toenails 1-5 b/l  elongated, thickened, discolored with subungual debris. +Tenderness with dorsal palpation of nailplates. Porokeratotic lesion(s) noted bilateral great toes, submet head 2 left foot, and submet head 5 left foot.  Orthopedic: Muscle strength 5/5 to all LE muscle groups of left lower extremity. Muscle strength 4/5to all LE muscle groups of right lower extremity. Utilizes cane for ambulation assistance.   Radiographs: None  Assessment/Plan: 1. Pain due to onychomycosis of toenails of both feet   2. Porokeratosis   3. Callus   4. Neuropathy     -Patient's family member present. All questions/concerns addressed on today's visit. -Examined patient. -Patient being worked up for RLE edema by PCP. DVT has been ruled out. -Medicaid ABN on file for paring of corn(s)/callus(es)/porokeratos(es). Copy in patient chart. -Continue supportive shoe gear daily. -Mycotic toenails 1-5 bilaterally were debrided in length and girth with sterile nail nippers and dremel without incident. -Porokeratotic lesion(s) bilateral great toes, submet head 2 left foot, and submet head 5 left foot pared and enucleated with sterile currette without incident. Total number of lesions debrided=4. -Patient/POA to call should there be question/concern in the interim.   Return in about 3 months (around 05/03/2022).  Marzetta Board, DPM

## 2022-02-16 ENCOUNTER — Ambulatory Visit (HOSPITAL_COMMUNITY): Payer: Medicaid Other | Admitting: Mental Health

## 2022-02-17 ENCOUNTER — Ambulatory Visit: Payer: Medicaid Other | Admitting: Podiatry

## 2022-03-22 ENCOUNTER — Encounter (HOSPITAL_COMMUNITY): Payer: Self-pay

## 2022-03-22 ENCOUNTER — Ambulatory Visit (INDEPENDENT_AMBULATORY_CARE_PROVIDER_SITE_OTHER): Payer: Medicaid Other | Admitting: Mental Health

## 2022-03-22 DIAGNOSIS — F411 Generalized anxiety disorder: Secondary | ICD-10-CM

## 2022-03-22 DIAGNOSIS — F331 Major depressive disorder, recurrent, moderate: Secondary | ICD-10-CM | POA: Diagnosis not present

## 2022-03-22 DIAGNOSIS — G3184 Mild cognitive impairment, so stated: Secondary | ICD-10-CM
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
## 2022-04-05 ENCOUNTER — Encounter (HOSPITAL_COMMUNITY): Payer: Self-pay | Admitting: Physician Assistant

## 2022-04-05 ENCOUNTER — Telehealth (HOSPITAL_COMMUNITY): Payer: Self-pay | Admitting: Physician Assistant

## 2022-04-05 NOTE — Telephone Encounter (Signed)
Called patient back ; patient's mother left a VM asking about today's appointment, called patient back patient did not want to reschedule. Asked to speak with mother and patient stated she was not there.

## 2022-04-13 NOTE — Telephone Encounter (Signed)
Message acknowledged and reviewed.

## 2022-04-15 ENCOUNTER — Ambulatory Visit (INDEPENDENT_AMBULATORY_CARE_PROVIDER_SITE_OTHER): Payer: Medicaid Other | Admitting: Physician Assistant

## 2022-04-15 ENCOUNTER — Encounter (HOSPITAL_COMMUNITY): Payer: Self-pay | Admitting: Physician Assistant

## 2022-04-15 DIAGNOSIS — F411 Generalized anxiety disorder: Secondary | ICD-10-CM

## 2022-04-15 DIAGNOSIS — F339 Major depressive disorder, recurrent, unspecified: Secondary | ICD-10-CM | POA: Diagnosis not present

## 2022-04-15 MED ORDER — SERTRALINE HCL 50 MG PO TABS
150.0000 mg | ORAL_TABLET | Freq: Every day | ORAL | 1 refills | Status: DC
Start: 2022-04-15 — End: 2022-06-19

## 2022-04-15 MED ORDER — ARIPIPRAZOLE 10 MG PO TABS
10.0000 mg | ORAL_TABLET | Freq: Every day | ORAL | 1 refills | Status: DC
Start: 2022-04-15 — End: 2022-06-19

## 2022-04-15 NOTE — Progress Notes (Signed)
BH MD/PA/NP OP Progress Note  04/15/2022 10:07 PM Stacey [name]  MRN:  [id number]  Chief Complaint:  Chief Complaint  Patient presents with   Follow-up   Medication Refill   HPI:   Stacey [name]. Devereaux is a 37 year old African-American, female with a past psychiatric history significant for major depressive disorder and generalized anxiety disorder who presents to [facility] accompanied by her foster mother Othello Dionicio, [phone]), for follow-up and medication management.  Patient was last seen by Daine Gip, MD on 11/19/2021.  During her last encounter, patient was being managed on the following psychiatric medications:  Abilify 10 mg daily Sertraline 150 mg daily  Patient reports that she has been compliant with her current medication regimen.  Patient reports no issues or concerns regarding her current medication regimen.  Patient states that her medications are helpful and reports that she is stable on them.  Patient denies depression at this time but states that she is anxious about life on occasion.  Provider provided a list of adult daycare programs to the patient's foster mother for the patient.  A PHQ-9 screen was performed with the patient scoring of 13.  A GAD-7 screen was also performed with the patient scoring of 15.  Patient is alert and oriented x 4, calm, cooperative, and fully engaged in conversation during the encounter.  Patient endorses good mood.  Patient denies suicidal or homicidal ideations.  She further denies auditory or visual hallucinations and does not appear to be responding to internal/external stimuli.  Patient endorses good sleep and receives on average 10 hours of sleep per night.  Patient endorses good appetite and eats on average 3 meals per day.  Patient denies alcohol consumption, tobacco use, and illicit drug use.  Visit Diagnosis:    ICD-10-CM   1. Recurrent major depressive disorder, remission  status unspecified  F33.9 ARIPiprazole (ABILIFY) 10 MG tablet    sertraline (ZOLOFT) 50 MG tablet    2. GAD (generalized anxiety disorder)  F41.1 sertraline (ZOLOFT) 50 MG tablet      Past Psychiatric History:  Diagnoses: major depressive disorder, generalized anxiety disorder, mild cognitive impairment 2/2 cerebral infarction in 2011   Past Medical History:  Past Medical History:  Diagnosis Date   Hypertension    Stroke    History reviewed. No pertinent surgical history.  Family Psychiatric History:  Patient is adopted.  Per patient's guardian, patient does not know too much about her family psychiatric history   Family History: History reviewed. No pertinent family history.  Social History:  Social History   Socioeconomic History   Marital status: Single    Spouse name: Not on file   Number of children: Not on file   Years of education: Not on file   Highest education level: Not on file  Occupational History   Not on file  Tobacco Use   Smoking status: Never   Smokeless tobacco: Never  Vaping Use   Vaping Use: Never used  Substance and Sexual Activity   Alcohol use: No   Drug use: No   Sexual activity: Not on file  Other Topics Concern   Not on file  Social History Narrative   Not on file   Social Determinants of Health   Financial Resource Strain: Not on file  Food Insecurity: Not on file  Transportation Needs: Not on file  Physical Activity: Not on file  Stress: Not on file  Social Connections: Not on file  Allergies:  Allergies  Allergen Reactions   Metformin Diarrhea and Other (See Comments)    Metabolic Disorder Labs: No results found for: "HGBA1C", "MPG" No results found for: "PROLACTIN" No results found for: "CHOL", "TRIG", "HDL", "CHOLHDL", "VLDL", "LDLCALC" No results found for: "TSH"  Therapeutic Level Labs: No results found for: "LITHIUM" No results found for: "VALPROATE" No results found for: "CBMZ"  Current  Medications: Current Outpatient Medications  Medication Sig Dispense Refill   ARIPiprazole (ABILIFY) 10 MG tablet Take 1 tablet (10 mg total) by mouth daily. 90 tablet 1   Ascorbic Acid 500 MG/5ML LIQD Take by mouth.     Ascorbic Acid 500 MG/5ML SYRP Take by mouth.      aspirin 325 MG EC tablet Take by mouth.     aspirin 325 MG tablet Take 325 mg by mouth daily.      atorvastatin (LIPITOR) 80 MG tablet Take by mouth.     atorvastatin (LIPITOR) 80 MG tablet Take by mouth.     baclofen (LIORESAL) 10 MG tablet Take 10 mg by mouth 3 (three) times daily.      benzonatate (TESSALON) 200 MG capsule TAKE ONE CAPSULE BY MOUTH THREE TIMES A DAY FOR UP TO 7 DAYS     Blood Glucose Monitoring Suppl (FIFTY50 GLUCOSE METER 2.0) w/Device KIT See admin instructions.     calcium-vitamin D (OSCAL WITH D) 500-200 MG-UNIT TABS tablet Take by mouth.      clopidogrel (PLAVIX) 75 MG tablet Take 75 mg by mouth daily. Patient taking ASA instead     Cysteamine Bitartrate (PROCYSBI) 300 MG PACK Use 1 each as directed.     Docusate Sodium (DSS) 100 MG CAPS Take by mouth.      esomeprazole (NEXIUM) 40 MG capsule Take by mouth.      ferrous sulfate 300 (60 Fe) MG/5ML syrup Take by mouth.      fluticasone (FLONASE) 50 MCG/ACT nasal spray Place into the nose.     glucose blood (PRECISION QID TEST) test strip Use 1 strip as directed.     Incontinence Supply Disposable (UNDERPADS) MISC 1 CASE + 5 PACKS PEACH!     ketoconazole (NIZORAL) 2 % cream Apply topically daily.     medroxyPROGESTERone (DEPO-PROVERA) 150 MG/ML injection Inject into the muscle.      medroxyPROGESTERone Acetate 150 MG/ML SUSY Inject into the muscle.      medroxyPROGESTERone Acetate 150 MG/ML SUSY Inject as directed See admin instructions.     metoprolol tartrate (LOPRESSOR) 25 MG tablet Take by mouth.     nitrofurantoin, macrocrystal-monohydrate, (MACROBID) 100 MG capsule Take 100 mg by mouth every 12 (twelve) hours.     ondansetron (ZOFRAN-ODT) 4 MG  disintegrating tablet Take by mouth.      ramipril (ALTACE) 5 MG capsule Take 5 mg by mouth daily.      sertraline (ZOLOFT) 50 MG tablet Take 3 tablets (150 mg total) by mouth daily. 270 tablet 1   Skin Protectants, Misc. (DERMACERIN) CREA Apply topically.      valACYclovir (VALTREX) 500 MG tablet 1 tab by mouth three times daily for 5 days as needed     Vitamin D, Ergocalciferol, (DRISDOL) 1.25 MG (50000 UNIT) CAPS capsule Take 50,000 Units by mouth once a week.     No current facility-administered medications for this visit.     Musculoskeletal: Strength & Muscle Tone: decreased, patient has a past history of stroke with some lingering effects present on the right side of her body.  Gait & Station:  Patient utilizes a cane and wheelchair to get ambulate Patient leans: N/A  Psychiatric Specialty Exam: Review of Systems  Psychiatric/Behavioral:  Negative for decreased concentration, dysphoric mood, hallucinations, self-injury, sleep disturbance and suicidal ideas. The patient is nervous/anxious. The patient is not hyperactive.     There were no vitals taken for this visit.There is no height or weight on file to calculate BMI.  General Appearance: Casual  Eye Contact:  Good  Speech:  Clear and Coherent and Normal Rate  Volume:  Normal  Mood:  Euthymic  Affect:  Appropriate  Thought Process:  Coherent and Descriptions of Associations: Intact  Orientation:  Full (Time, Place, and Person)  Thought Content: WDL   Suicidal Thoughts:  No  Homicidal Thoughts:  No  Memory:  Immediate;   Good Recent;   Fair Remote;   Fair  Judgement:  Fair  Insight:  Fair  Psychomotor Activity:  Normal  Concentration:  Concentration: Fair and Attention Span: Fair  Recall:  [employer] of Knowledge: Fair  Language: Fair  Akathisia:  No  Handed:  Left  AIMS (if indicated): not done  Assets:  Communication Skills Desire for Improvement Housing Social Support  ADL's:  Impaired  Cognition: Impaired,   Mild  Sleep:  Good   Screenings: GAD-7    Flowsheet Row Clinical Support from 04/15/2022 in [facility] Counselor from 03/22/2022 in [facility] Office Visit from 02/04/2022 in [facility] Video Visit from 04/14/2021 in [facility]  Total GAD-7 Score PHQ2-9    Flowsheet Row Clinical Support from 04/15/2022 in [facility] Counselor from 03/22/2022 in [facility] Office Visit from 02/04/2022 in [facility] Clinical Support from 07/29/2021 in [facility] Clinical Support from 05/26/2021 in [facility]  PHQ-2 Total Score PHQ-9 Total Score Flowsheet Row Clinical Support from 04/15/2022 in [facility] Counselor from 03/22/2022 in [facility] Office Visit from 02/04/2022 in [facility]  C-SSRS RISK CATEGORY No Risk No Risk No Risk        Assessment and Plan:   Stacey [name] is a 37 year old African-American, female with a past psychiatric history significant for major depressive disorder and generalized anxiety disorder who presents to [facility] accompanied by her foster mother Shantee Hayne, [phone]), for follow-up and medication management.  Patient reports no issues or concerns regarding her current medication regimen and continues to take her medications as prescribed.  Patient denies depression although she scored a 13 on her PHQ-9 screen.  During the encounter, patient endorsed some anxiety attributed to life stressors.  Patient's foster mother was provided a list of adult daycare programs for the patient.  Patient's foster mother to look into resources for the  patient.  Patient to continue taking medications as prescribed.  Patient's medications to be e-prescribed to pharmacy of choice.  Collaboration of Care: Collaboration of Care: Medication Management AEB provider managing patient's psychiatric medications, Primary Care Provider AEB patient being seen by primary care provider, and Psychiatrist AEB patient being followed by mental health provider  Patient/Guardian was advised Release of Information must be obtained prior to any record release in order  to collaborate their care with an outside provider. Patient/Guardian was advised if they have not already done so to contact the registration department to sign all necessary forms in order for [place] to release information regarding their care.   Consent: Patient/Guardian gives verbal consent for treatment and assignment of benefits for services provided during this visit. Patient/Guardian expressed understanding and agreed to proceed.   1. Recurrent major depressive disorder, remission status unspecified  - ARIPiprazole (ABILIFY) 10 MG tablet; Take 1 tablet (10 mg total) by mouth daily.  Dispense: 90 tablet; Refill: 1 - sertraline (ZOLOFT) 50 MG tablet; Take 3 tablets (150 mg total) by mouth daily.  Dispense: 270 tablet; Refill: 1  2. GAD (generalized anxiety disorder)  - sertraline (ZOLOFT) 50 MG tablet; Take 3 tablets (150 mg total) by mouth daily.  Dispense: 270 tablet; Refill: 1   Patient to follow-up in 2 months Provider spent a total of 15 minutes with the patient/reviewing patient's chart  Meta Hatchet, PA 04/15/2022, 10:07 PM

## 2022-04-26 ENCOUNTER — Ambulatory Visit (INDEPENDENT_AMBULATORY_CARE_PROVIDER_SITE_OTHER): Payer: Medicaid Other | Admitting: Mental Health

## 2022-04-26 DIAGNOSIS — F339 Major depressive disorder, recurrent, unspecified: Secondary | ICD-10-CM | POA: Diagnosis not present

## 2022-04-26 DIAGNOSIS — G3184 Mild cognitive impairment, so stated: Secondary | ICD-10-CM

## 2022-04-26 DIAGNOSIS — F411 Generalized anxiety disorder: Secondary | ICD-10-CM

## 2022-04-26 NOTE — Progress Notes (Unsigned)
   THERAPIST PROGRESS NOTE  Session Time: 12:35pm   Participation Level: Minimal  Behavioral Response: CasualLethargicNeutral  Type of Therapy: Individual Therapy  Treatment Goals addressed: STG: Stacey [name] will decrease sxs of depression AEB development of effective coping skills with ability to process concerns in effective manner per self report within the next 90 days   ProgressTowards Goals: Initial  Interventions: Solution Focused and Supportive  Summary: Stacey [name] is a 37 y.o. female who presents with MDD, GAD and cognitive impairments. Presents with mother and shares would like mother to remain for duration of session. Limited engagement. Mood and affect adequate; speech muffled. Stacey [name] shares to be doing well and now denies concern for issues with biological mother or desire to know why she was given up for adoption. Shares for mood to have improved, "better mood." Difficulty articulating factors that have contributed to her being in a better mood. Reports to be medication compliant. Inconsistent in reports, unclear if fully understand clinician line of questions. When asked to rate anxiety and depression rates most severe at 10, however continues to report things to be going well and for mood to be well. Mother shares for Stacey [name] to have at times difficulty understanding and processing information. Mother engaged with therapist to explore how pt needs can be better met and express desire for Stacey [name] to engage in community more and have interactions with others. Reports need for break with being sole care taker for Stacey [name]. Receptive of referrals for [facility] IDD case management and following up with Day programs. Agrees to follow up and cease OPT services with follow to ensure connection to needed services. No safety concerns reported.   Suicidal/Homicidal: Nowithout intent/plan  Therapist Response: Therapist engaged Environmental manager in therapy services. Obtained verbal consent for mother  to be present for OPT session. Assessed for current level of functioning, sxs management and level of stressors. Explored factors that have contributed to increased mood and denial of depressive sxs. Engaged with mother and explored more appropriate services for [place]. Educated on [place] IDD care coordinator or contact [employer] for [employer] of respite, community guide; skill building and additional referral for Day programs if needed. Educated on Legal guardianship and possible need for updated psychological evaluation. Reviewed session and provided follow up.   Plan: Return again in x 4 weeks.  Diagnosis: Recurrent major depressive disorder, remission status unspecified  GAD (generalized anxiety disorder)  Mild cognitive impairment  Collaboration of Care: Other IDD services- Day program, Respite, [employer]; skill building legal guardianship   Patient/Guardian was advised Release of Information must be obtained prior to any record release in order to collaborate their care with an outside provider. Patient/Guardian was advised if they have not already done so to contact the registration department to sign all necessary forms in order for [place] to release information regarding their care.   Consent: Patient/Guardian gives verbal consent for treatment and assignment of benefits for services provided during this visit. Patient/Guardian expressed understanding and agreed to proceed.   Stephan Minister [place], [facility] 04/26/2022

## 2022-05-18 ENCOUNTER — Ambulatory Visit: Payer: Medicaid Other | Admitting: Podiatry

## 2022-06-01 ENCOUNTER — Ambulatory Visit (HOSPITAL_COMMUNITY): Payer: Medicaid Other | Admitting: Mental Health

## 2022-06-16 ENCOUNTER — Telehealth (INDEPENDENT_AMBULATORY_CARE_PROVIDER_SITE_OTHER): Payer: Medicaid Other | Admitting: Physician Assistant

## 2022-06-16 DIAGNOSIS — F339 Major depressive disorder, recurrent, unspecified: Secondary | ICD-10-CM

## 2022-06-16 DIAGNOSIS — F411 Generalized anxiety disorder: Secondary | ICD-10-CM | POA: Diagnosis not present

## 2022-06-19 ENCOUNTER — Encounter (HOSPITAL_COMMUNITY): Payer: Self-pay | Admitting: Physician Assistant

## 2022-06-19 MED ORDER — SERTRALINE HCL 50 MG PO TABS
150.0000 mg | ORAL_TABLET | Freq: Every day | ORAL | 1 refills | Status: DC
Start: 2022-06-19 — End: 2022-08-18

## 2022-06-19 MED ORDER — ARIPIPRAZOLE 10 MG PO TABS
10.0000 mg | ORAL_TABLET | Freq: Every day | ORAL | 1 refills | Status: DC
Start: 2022-06-19 — End: 2022-08-18

## 2022-06-19 NOTE — Progress Notes (Signed)
BH MD/PA/NP OP Progress Note  Virtual Visit via Video Note  I connected with Stacey [name] on 06/19/22 at  2:00 PM EDT by a video enabled telemedicine application and verified that I am speaking with the correct person using two identifiers.  Location: Patient: Home Provider: Clinic   I discussed the limitations of evaluation and management by telemedicine and the availability of in person appointments. The patient expressed understanding and agreed to proceed.  Follow Up Instructions:  I discussed the assessment and treatment plan with the patient. The patient was provided an opportunity to ask questions and all were answered. The patient agreed with the plan and demonstrated an understanding of the instructions.   The patient was advised to call back or seek an in-person evaluation if the symptoms worsen or if the condition fails to improve as anticipated.  I provided 21 minutes of non-face-to-face time during this encounter.  Meta Hatchet, PA   06/19/2022 12:05 PM CAITLIN COMIS  MRN:  [id number]  Chief Complaint:  Chief Complaint  Patient presents with   Follow-up   Medication Refill   HPI:   Stacey Orrick. Wheatcroft is a 37 year old African-American, female with a past psychiatric history significant for major depressive disorder and generalized anxiety disorder who presents to [facility] accompanied by her foster mother Tyson Cottier, [phone]), for follow-up and medication management.  Patient is being managed on the following psychiatric medications:  Abilify 10 mg daily Sertraline 150 mg daily  Patient reports that her use of medication has been going well.  Patient denies experiencing any adverse side effects on her current medication regimen.  Although patient scored an 18 on her PHQ-9 screen, she denied depression during the encounter.  Patient also denied anxiety, although she scored a 15 on her GAD-7 screen.  Per  patient's stepmother, she denies any changes in the patient's behavior but states that the patient may occasionally be irritable at times.  She is still in the process of trying to find a day program for the patient but has ran into some obstacles.  She reports that the patient may need a specific diagnosis in order to participate in some day programs.  Patient is alert and oriented x 4, calm, cooperative, and fully engaged in conversation during the encounter.  Patient endorses good mood.  Patient denies suicidal or homicidal ideations.  She further denies auditory or visual hallucinations and does not appear to be responding to internal/external stimuli.  Patient endorses good sleep and receives on average 10 hours of sleep per night.  Patient endorses good appetite and eats on average 3 meals per day.  Patient denies alcohol consumption, tobacco use, and illicit drug use.  Visit Diagnosis:    ICD-10-CM   1. GAD (generalized anxiety disorder)  F41.1 sertraline (ZOLOFT) 50 MG tablet    2. Recurrent major depressive disorder, remission status unspecified (HCC)  F33.9 sertraline (ZOLOFT) 50 MG tablet    ARIPiprazole (ABILIFY) 10 MG tablet      Past Psychiatric History:  Diagnoses: major depressive disorder, generalized anxiety disorder, mild cognitive impairment 2/2 cerebral infarction in 2011   Past Medical History:  Past Medical History:  Diagnosis Date   Hypertension    Stroke [facility])    History reviewed. No pertinent surgical history.  Family Psychiatric History:  Patient is adopted.  Per patient's guardian, patient does not know too much about her family psychiatric history   Family History: History reviewed. No pertinent family history.  Social History:  Social History   Socioeconomic History   Marital status: Single    Spouse name: Not on file   Number of children: Not on file   Years of education: Not on file   Highest education level: Not on file  Occupational History    Not on file  Tobacco Use   Smoking status: Never   Smokeless tobacco: Never  Vaping Use   Vaping Use: Never used  Substance and Sexual Activity   Alcohol use: No   Drug use: No   Sexual activity: Not on file  Other Topics Concern   Not on file  Social History Narrative   Not on file   Social Determinants of Health   Financial Resource Strain: Not on file  Food Insecurity: Not on file  Transportation Needs: Not on file  Physical Activity: Not on file  Stress: Not on file  Social Connections: Not on file    Allergies:  Allergies  Allergen Reactions   Metformin Diarrhea and Other (See Comments)    Metabolic Disorder Labs: No results found for: "HGBA1C", "MPG" No results found for: "PROLACTIN" No results found for: "CHOL", "TRIG", "HDL", "CHOLHDL", "VLDL", "LDLCALC" No results found for: "TSH"  Therapeutic Level Labs: No results found for: "LITHIUM" No results found for: "VALPROATE" No results found for: "CBMZ"  Current Medications: Current Outpatient Medications  Medication Sig Dispense Refill   ARIPiprazole (ABILIFY) 10 MG tablet Take 1 tablet (10 mg total) by mouth daily. 90 tablet 1   Ascorbic Acid 500 MG/5ML LIQD Take by mouth.     Ascorbic Acid 500 MG/5ML SYRP Take by mouth.      aspirin 325 MG EC tablet Take by mouth.     aspirin 325 MG tablet Take 325 mg by mouth daily.      atorvastatin (LIPITOR) 80 MG tablet Take by mouth.     atorvastatin (LIPITOR) 80 MG tablet Take by mouth.     baclofen (LIORESAL) 10 MG tablet Take 10 mg by mouth 3 (three) times daily.      benzonatate (TESSALON) 200 MG capsule TAKE ONE CAPSULE BY MOUTH THREE TIMES A DAY FOR UP TO 7 DAYS     Blood Glucose Monitoring Suppl (FIFTY50 GLUCOSE METER 2.0) w/Device KIT See admin instructions.     calcium-vitamin D (OSCAL WITH D) 500-200 MG-UNIT TABS tablet Take by mouth.      clopidogrel (PLAVIX) 75 MG tablet Take 75 mg by mouth daily. Patient taking ASA instead     Cysteamine Bitartrate  (PROCYSBI) 300 MG PACK Use 1 each as directed.     Docusate Sodium (DSS) 100 MG CAPS Take by mouth.      esomeprazole (NEXIUM) 40 MG capsule Take by mouth.      ferrous sulfate 300 (60 Fe) MG/5ML syrup Take by mouth.      fluticasone (FLONASE) 50 MCG/ACT nasal spray Place into the nose.     glucose blood (PRECISION QID TEST) test strip Use 1 strip as directed.     Incontinence Supply Disposable (UNDERPADS) MISC 1 CASE + 5 PACKS PEACH!     ketoconazole (NIZORAL) 2 % cream Apply topically daily.     medroxyPROGESTERone (DEPO-PROVERA) 150 MG/ML injection Inject into the muscle.      medroxyPROGESTERone Acetate 150 MG/ML SUSY Inject into the muscle.      medroxyPROGESTERone Acetate 150 MG/ML SUSY Inject as directed See admin instructions.     metoprolol tartrate (LOPRESSOR) 25 MG tablet Take by mouth.  nitrofurantoin, macrocrystal-monohydrate, (MACROBID) 100 MG capsule Take 100 mg by mouth every 12 (twelve) hours.     ondansetron (ZOFRAN-ODT) 4 MG disintegrating tablet Take by mouth.      ramipril (ALTACE) 5 MG capsule Take 5 mg by mouth daily.      sertraline (ZOLOFT) 50 MG tablet Take 3 tablets (150 mg total) by mouth daily. 270 tablet 1   Skin Protectants, Misc. (DERMACERIN) CREA Apply topically.      valACYclovir (VALTREX) 500 MG tablet 1 tab by mouth three times daily for 5 days as needed     Vitamin D, Ergocalciferol, (DRISDOL) 1.25 MG (50000 UNIT) CAPS capsule Take 50,000 Units by mouth once a week.     No current facility-administered medications for this visit.     Musculoskeletal: Strength & Muscle Tone: decreased, patient has a past history of stroke with some lingering effects present on the right side of her body.  Gait & Station:  Patient utilizes a cane and wheelchair to get ambulate Patient leans: N/A  Psychiatric Specialty Exam: Review of Systems  Psychiatric/Behavioral:  Negative for decreased concentration, dysphoric mood, hallucinations, self-injury, sleep  disturbance and suicidal ideas. The patient is not nervous/anxious and is not hyperactive.     There were no vitals taken for this visit.There is no height or weight on file to calculate BMI.  General Appearance: Casual  Eye Contact:  Good  Speech:  Clear and Coherent and Normal Rate  Volume:  Normal  Mood:  Euthymic  Affect:  Appropriate  Thought Process:  Coherent and Descriptions of Associations: Intact  Orientation:  Full (Time, Place, and Person)  Thought Content: WDL   Suicidal Thoughts:  No  Homicidal Thoughts:  No  Memory:  Immediate;   Good Recent;   Fair Remote;   Fair  Judgement:  Fair  Insight:  Fair  Psychomotor Activity:  Normal  Concentration:  Concentration: Fair and Attention Span: Fair  Recall:  [employer] of Knowledge: Fair  Language: Fair  Akathisia:  No  Handed:  Left  AIMS (if indicated): not done  Assets:  Communication Skills Desire for Improvement Housing Social Support  ADL's:  Impaired  Cognition: Impaired,  Mild  Sleep:  Good   Screenings: GAD-7    Flowsheet Row Video Visit from 06/16/2022 in [facility] Clinical Support from 04/15/2022 in [facility] Counselor from 03/22/2022 in [facility] Office Visit from 02/04/2022 in [facility] Video Visit from 04/14/2021 in [facility]  Total GAD-7 Score 15 15 6 12 21       PHQ2-9    Flowsheet Row Video Visit from 06/16/2022 in [facility] Clinical Support from 04/15/2022 in [facility] Counselor from 03/22/2022 in [facility] Office Visit from 02/04/2022 in [facility] Clinical Support from 07/29/2021 in [facility]  PHQ-2 Total Score 4 4 2 4 2   PHQ-9 Total Score 18 13 20 13 5       Flowsheet Row Video Visit from 06/16/2022 in  [facility] Clinical Support from 04/15/2022 in [facility] Counselor from 03/22/2022 in [facility]  C-SSRS RISK CATEGORY No Risk No Risk No Risk        Assessment and Plan:   Geraldina Warta. Famularo is a 37 year old African-American, female with a past psychiatric history significant for  major depressive disorder and generalized anxiety disorder who presents to [facility] accompanied by her foster mother Shawneen Kuklinski, [phone]), for follow-up and medication management.  Patient reports no issues or concerns regarding her current medication regimen at this time.  Patient appears to be stable on her current medication regimen and denies depressive symptoms or anxiety.  Patient stepmother states that she has not noticed any major changes in the patient's mood/behavior but states that the patient can be irritable at times.  Patient's stepmother is still in the process of trying to find daycare programs for the patient.  Patient to continue taking her medications as prescribed.  Patient's medications to be e-prescribed to pharmacy of choice.  Collaboration of Care: Collaboration of Care: Medication Management AEB provider managing patient's psychiatric medications, Primary Care Provider AEB patient being seen by primary care provider, and Psychiatrist AEB patient being followed by mental health provider  Patient/Guardian was advised Release of Information must be obtained prior to any record release in order to collaborate their care with an outside provider. Patient/Guardian was advised if they have not already done so to contact the registration department to sign all necessary forms in order for [place] to release information regarding their care.   Consent: Patient/Guardian gives verbal consent for treatment and assignment of benefits for services provided during this visit.  Patient/Guardian expressed understanding and agreed to proceed.   1. GAD (generalized anxiety disorder)  - sertraline (ZOLOFT) 50 MG tablet; Take 3 tablets (150 mg total) by mouth daily.  Dispense: 270 tablet; Refill: 1  2. Recurrent major depressive disorder, remission status unspecified (HCC)  - sertraline (ZOLOFT) 50 MG tablet; Take 3 tablets (150 mg total) by mouth daily.  Dispense: 270 tablet; Refill: 1 - ARIPiprazole (ABILIFY) 10 MG tablet; Take 1 tablet (10 mg total) by mouth daily.  Dispense: 90 tablet; Refill: 1  Patient to follow-up in 2 months Provider spent a total of 21 minutes with the patient/reviewing patient's chart  Meta Hatchet, PA 06/19/2022, 12:05 PM

## 2022-07-04 ENCOUNTER — Other Ambulatory Visit: Payer: Self-pay

## 2022-07-04 ENCOUNTER — Encounter (HOSPITAL_BASED_OUTPATIENT_CLINIC_OR_DEPARTMENT_OTHER): Payer: Self-pay

## 2022-07-04 ENCOUNTER — Emergency Department (HOSPITAL_BASED_OUTPATIENT_CLINIC_OR_DEPARTMENT_OTHER)
Admission: EM | Admit: 2022-07-04 | Discharge: 2022-07-05 | Disposition: A | Discharge status: Home / Self Care | Payer: MEDICAID | Attending: Emergency Medicine | Admitting: Emergency Medicine

## 2022-07-04 DIAGNOSIS — N189 Chronic kidney disease, unspecified: Secondary | ICD-10-CM | POA: Diagnosis not present

## 2022-07-04 DIAGNOSIS — Y92481 Parking lot as the place of occurrence of the external cause: Secondary | ICD-10-CM | POA: Diagnosis not present

## 2022-07-04 DIAGNOSIS — I69351 Hemiplegia and hemiparesis following cerebral infarction affecting right dominant side: Secondary | ICD-10-CM | POA: Diagnosis not present

## 2022-07-04 DIAGNOSIS — Z7902 Long term (current) use of antithrombotics/antiplatelets: Secondary | ICD-10-CM | POA: Insufficient documentation

## 2022-07-04 DIAGNOSIS — R112 Nausea with vomiting, unspecified: Secondary | ICD-10-CM

## 2022-07-04 DIAGNOSIS — Z7982 Long term (current) use of aspirin: Secondary | ICD-10-CM | POA: Insufficient documentation

## 2022-07-04 DIAGNOSIS — W01198A Fall on same level from slipping, tripping and stumbling with subsequent striking against other object, initial encounter: Secondary | ICD-10-CM | POA: Diagnosis not present

## 2022-07-04 DIAGNOSIS — Z79899 Other long term (current) drug therapy: Secondary | ICD-10-CM | POA: Diagnosis not present

## 2022-07-04 DIAGNOSIS — S0990XA Unspecified injury of head, initial encounter: Secondary | ICD-10-CM | POA: Insufficient documentation

## 2022-07-04 DIAGNOSIS — D72829 Elevated white blood cell count, unspecified: Secondary | ICD-10-CM | POA: Insufficient documentation

## 2022-07-04 DIAGNOSIS — W19XXXA Unspecified fall, initial encounter: Secondary | ICD-10-CM | POA: Insufficient documentation

## 2022-07-04 DIAGNOSIS — I129 Hypertensive chronic kidney disease with stage 1 through stage 4 chronic kidney disease, or unspecified chronic kidney disease: Secondary | ICD-10-CM | POA: Diagnosis not present

## 2022-07-04 DIAGNOSIS — G8193 Hemiplegia, unspecified affecting right nondominant side: Secondary | ICD-10-CM | POA: Insufficient documentation

## 2022-07-04 DIAGNOSIS — I10 Essential (primary) hypertension: Secondary | ICD-10-CM | POA: Insufficient documentation

## 2022-07-04 LAB — LIPASE, BLOOD: Lipase: 48 U/L (ref 11–51)

## 2022-07-04 LAB — CBC
HCT: 33.6 % — ABNORMAL LOW (ref 36.0–46.0)
Hemoglobin: 10.5 g/dL — ABNORMAL LOW (ref 12.0–15.0)
MCH: 25.9 pg — ABNORMAL LOW (ref 26.0–34.0)
MCHC: 31.3 g/dL (ref 30.0–36.0)
MCV: 82.8 fL (ref 80.0–100.0)
Platelets: 253 10*3/uL (ref 150–400)
RBC: 4.06 MIL/uL (ref 3.87–5.11)
RDW: 18.5 % — ABNORMAL HIGH (ref 11.5–15.5)
WBC: 11.5 10*3/uL — ABNORMAL HIGH (ref 4.0–10.5)
nRBC: 0 % (ref 0.0–0.2)

## 2022-07-04 LAB — COMPREHENSIVE METABOLIC PANEL WITH GFR
Albumin: 3.2 g/dL — ABNORMAL LOW (ref 3.5–5.0)
Anion gap: 10 (ref 5–15)
CO2: 16 mmol/L — ABNORMAL LOW (ref 22–32)
Creatinine, Ser: 2.02 mg/dL — ABNORMAL HIGH (ref 0.44–1.00)
GFR, Estimated: 32 mL/min — ABNORMAL LOW (ref >60)
Glucose, Bld: 94 mg/dL (ref 70–99)
Total Bilirubin: 0.3 mg/dL (ref 0.3–1.2)
Total Protein: 7.6 g/dL (ref 6.5–8.1)

## 2022-07-04 LAB — COMPREHENSIVE METABOLIC PANEL
ALT: 14 U/L (ref 0–44)
AST: 15 U/L (ref 15–41)
Alkaline Phosphatase: 95 U/L (ref 38–126)
BUN: 41 mg/dL — ABNORMAL HIGH (ref 6–20)
Calcium: 8.3 mg/dL — ABNORMAL LOW (ref 8.9–10.3)
Chloride: 109 mmol/L (ref 98–111)
Potassium: 4.1 mmol/L (ref 3.5–5.1)
Sodium: 135 mmol/L (ref 135–145)

## 2022-07-04 NOTE — ED Triage Notes (Signed)
POV from home, BIB wheelchair, A&O x 3 which is normal per mother due to hx of stroke  Mother sts that this morning after doctors visit she began to have vomiting, felt better at EMS came out to check vital signs, tried to eat but cont to vomit. Denies other symptoms of diarrhea or constipation.

## 2022-07-05 ENCOUNTER — Encounter (HOSPITAL_COMMUNITY): Payer: Self-pay

## 2022-07-05 ENCOUNTER — Other Ambulatory Visit: Payer: Self-pay

## 2022-07-05 ENCOUNTER — Emergency Department (HOSPITAL_COMMUNITY)
Admission: EM | Admit: 2022-07-05 | Discharge: 2022-07-06 | Disposition: A | Discharge status: Home / Self Care | Payer: MEDICAID | Attending: Emergency Medicine | Admitting: Emergency Medicine

## 2022-07-05 DIAGNOSIS — S0990XA Unspecified injury of head, initial encounter: Secondary | ICD-10-CM | POA: Insufficient documentation

## 2022-07-05 DIAGNOSIS — I10 Essential (primary) hypertension: Secondary | ICD-10-CM | POA: Insufficient documentation

## 2022-07-05 DIAGNOSIS — Z7902 Long term (current) use of antithrombotics/antiplatelets: Secondary | ICD-10-CM | POA: Insufficient documentation

## 2022-07-05 DIAGNOSIS — W19XXXA Unspecified fall, initial encounter: Secondary | ICD-10-CM

## 2022-07-05 DIAGNOSIS — Z79899 Other long term (current) drug therapy: Secondary | ICD-10-CM | POA: Insufficient documentation

## 2022-07-05 DIAGNOSIS — W01198A Fall on same level from slipping, tripping and stumbling with subsequent striking against other object, initial encounter: Secondary | ICD-10-CM | POA: Insufficient documentation

## 2022-07-05 DIAGNOSIS — Z8673 Personal history of transient ischemic attack (TIA), and cerebral infarction without residual deficits: Secondary | ICD-10-CM | POA: Insufficient documentation

## 2022-07-05 DIAGNOSIS — Z7982 Long term (current) use of aspirin: Secondary | ICD-10-CM | POA: Insufficient documentation

## 2022-07-05 MED ORDER — ONDANSETRON HCL 4 MG/2ML IJ SOLN
4.0000 mg | Freq: Once | INTRAMUSCULAR | Status: AC
  Administered 2022-07-05: 4 mg via INTRAVENOUS
  Filled 2022-07-05: qty 2

## 2022-07-05 MED ORDER — SODIUM CHLORIDE 0.9 % IV BOLUS
1000.0000 mL | Freq: Once | INTRAVENOUS | Status: AC
  Administered 2022-07-05: 1000 mL via INTRAVENOUS

## 2022-07-05 MED ORDER — ONDANSETRON 8 MG PO TBDP: ORAL_TABLET | ORAL | 0 refills | Status: DC

## 2022-07-05 NOTE — ED Triage Notes (Signed)
Pt BIB EMS c/o fall on concrete, fall witnessed, no LOC. Hx of thinners on Plavix. Pt states she is having dizziness and left hip pain. R sided deficits from previous stroke. Pt A&O x 1, family states this is baseline to EMS.

## 2022-07-05 NOTE — Discharge Instructions (Signed)
Begin taking Zofran as prescribed as needed for nausea.  Clear liquid diet for the next 12 hours, then slowly advance to normal as tolerated.  Return to the ER if you develop severe abdominal pain, high fever, bloody stools, or for other new and concerning symptoms.

## 2022-07-05 NOTE — ED Provider Notes (Signed)
[place] [facility] Provider Note   CSN: [id number] Arrival date & time: 07/04/22  2144     History  Chief Complaint  Patient presents with   Emesis    Stacey [name] is a 37 y.o. female.  Patient is a 37 year old female with past medical history of prior CVA with right-sided hemiparesis, hyperlipidemia, hypertension, chronic renal insufficiency.  Patient presenting today with complaints of vomiting.  This started earlier today.  She has had 2 episodes of this.  No diarrhea or constipation.  Patient denies any abdominal pain, fevers, or chills.  No alleviating or aggravating factors.  The history is provided by the patient.       Home Medications Prior to Admission medications   Medication Sig Start Date End Date Taking? Authorizing Provider  ARIPiprazole (ABILIFY) 10 MG tablet Take 1 tablet (10 mg total) by mouth daily. 06/19/22 12/16/22  Meta Hatchet, PA  Ascorbic Acid 500 MG/5ML LIQD Take by mouth. 03/05/09   [provider]  Ascorbic Acid 500 MG/5ML SYRP Take by mouth.  03/05/09   [provider]  aspirin 325 MG EC tablet Take by mouth. 04/27/15   [provider]  aspirin 325 MG tablet Take 325 mg by mouth daily.     [provider]  atorvastatin (LIPITOR) 80 MG tablet Take by mouth. 05/15/20   [provider]  atorvastatin (LIPITOR) 80 MG tablet Take by mouth. 06/29/20   [provider]  baclofen (LIORESAL) 10 MG tablet Take 10 mg by mouth 3 (three) times daily.     [provider]  benzonatate (TESSALON) 200 MG capsule TAKE ONE CAPSULE BY MOUTH THREE TIMES A DAY FOR UP TO 7 DAYS 03/28/18   [provider]  Blood Glucose Monitoring Suppl (FIFTY50 GLUCOSE METER 2.0) w/Device KIT See admin instructions. 11/09/18   [provider]  calcium-vitamin D (OSCAL WITH D) 500-200 MG-UNIT TABS tablet Take by mouth.  04/18/09   [provider]  clopidogrel (PLAVIX)  75 MG tablet Take 75 mg by mouth daily. Patient taking ASA instead    [provider]  Cysteamine Bitartrate (PROCYSBI) 300 MG PACK Use 1 each as directed. 11/09/18   [provider]  Docusate Sodium (DSS) 100 MG CAPS Take by mouth.  03/05/09   [provider]  esomeprazole (NEXIUM) 40 MG capsule Take by mouth.  03/05/09   [provider]  ferrous sulfate 300 (60 Fe) MG/5ML syrup Take by mouth.  03/05/09   [provider]  fluticasone (FLONASE) 50 MCG/ACT nasal spray Place into the nose. 11/15/19 11/14/20  [provider]  glucose blood (PRECISION QID TEST) test strip Use 1 strip as directed. 11/09/18   [provider]  Incontinence Supply Disposable (UNDERPADS) MISC 1 CASE + 5 PACKS PEACH! 06/07/13   [provider]  ketoconazole (NIZORAL) 2 % cream Apply topically daily. 08/07/20   [provider]  medroxyPROGESTERone (DEPO-PROVERA) 150 MG/ML injection Inject into the muscle.  10/20/16 03/28/20  [provider]  medroxyPROGESTERone Acetate 150 MG/ML SUSY Inject into the muscle.  09/10/19   [provider]  medroxyPROGESTERone Acetate 150 MG/ML SUSY Inject as directed See admin instructions. 12/10/19   [provider]  metoprolol tartrate (LOPRESSOR) 25 MG tablet Take by mouth. 05/15/20   [provider]  nitrofurantoin, macrocrystal-monohydrate, (MACROBID) 100 MG capsule Take 100 mg by mouth every 12 (twelve) hours. 12/16/20   [provider]  ondansetron (ZOFRAN-ODT) 4 MG  disintegrating tablet Take by mouth.  03/05/09   [provider]  ramipril (ALTACE) 5 MG capsule Take 5 mg by mouth daily.     [provider]  sertraline (ZOLOFT) 50 MG tablet Take 3 tablets (150 mg total) by mouth daily. 06/19/22 12/16/22  Meta Hatchet, PA  Skin Protectants, Misc. (DERMACERIN) CREA Apply topically.  04/18/09   [provider]  valACYclovir (VALTREX) 500 MG tablet 1 tab by  mouth three times daily for 5 days as needed 01/07/11   [provider]  Vitamin D, Ergocalciferol, (DRISDOL) 1.25 MG (50000 UNIT) CAPS capsule Take 50,000 Units by mouth once a week. 12/30/20   [provider]      Allergies    Metformin    Review of Systems   Review of Systems  All other systems reviewed and are negative.   Physical Exam Updated Vital Signs BP 119/70 (BP Location: Left Arm)   Pulse 74   Temp 97.7 F (36.5 C)   Resp 18   Ht 5\' 7"  (1.702 m)   Wt 84.8 kg   SpO2 95%   BMI 29.29 kg/m  Physical Exam Vitals and nursing note reviewed.  Constitutional:      General: She is not in acute distress.    Appearance: She is well-developed. She is not diaphoretic.  HENT:     Head: Normocephalic and atraumatic.  Cardiovascular:     Rate and Rhythm: Normal rate and regular rhythm.     Heart sounds: No murmur heard.    No friction rub. No gallop.  Pulmonary:     Effort: Pulmonary effort is normal. No respiratory distress.     Breath sounds: Normal breath sounds. No wheezing.  Abdominal:     General: Bowel sounds are normal. There is no distension.     Palpations: Abdomen is soft.     Tenderness: There is no abdominal tenderness.     Comments: Abdomen is benign with no tenderness.  Musculoskeletal:        General: Normal range of motion.     Cervical back: Normal range of motion and neck supple.  Skin:    General: Skin is warm and dry.  Neurological:     Mental Status: She is alert and oriented to person, place, and time.     Comments: Right sided hemiparesis noted     ED Results / Procedures / Treatments   Labs (all labs ordered are listed, but only abnormal results are displayed) Labs Reviewed  COMPREHENSIVE METABOLIC PANEL - Abnormal; Notable for the following components:      Result Value   CO2 16 (*)    BUN 41 (*)    Creatinine, Ser 2.02 (*)    Calcium 8.3 (*)    Albumin 3.2 (*)    GFR, Estimated 32 (*)    All other components  within normal limits  CBC - Abnormal; Notable for the following components:   WBC 11.5 (*)    Hemoglobin 10.5 (*)    HCT 33.6 (*)    MCH 25.9 (*)    RDW 18.5 (*)    All other components within normal limits  LIPASE, BLOOD  URINALYSIS, ROUTINE W REFLEX MICROSCOPIC  PREGNANCY, URINE    EKG None  Radiology No results found.  Procedures Procedures    Medications Ordered in ED Medications  sodium chloride 0.9 % bolus 1,000 mL (has no administration in time range)  ondansetron (ZOFRAN) injection 4 mg (has no administration in time  range)    ED Course/ Medical Decision Making/ A&P  Patient is a 37 year old female presenting with complaints of nausea and vomiting as described in the HPI.  She arrives here with stable vital signs and is afebrile.  Physical examination reveals baseline right hemiparesis from prior CVA, but is otherwise unremarkable.  Abdomen is benign.  Laboratory studies obtained including CBC, CMP, and lipase.  She does have a mild leukocytosis with white count of 12,000.  CMP is consistent with dehydration.  She has a modest bump in her creatinine which today is 2.2.  BUN is 41 and bicarb 16.  Patient hydrated with normal saline.  She was also given IV Zofran and is now tolerating p.o. without difficulty.  I feels the patient can safely be discharged.  I will advise her to follow-up with primary doctor later this week for a recheck of her creatinine.  She will be discharged with Zofran.  Final Clinical Impression(s) / ED Diagnoses Final diagnoses:  None    Rx / DC Orders ED Discharge Orders     None         Geoffery Lyons, MD 07/05/22 [phone]

## 2022-07-06 ENCOUNTER — Emergency Department (HOSPITAL_COMMUNITY): Payer: MEDICAID

## 2022-07-06 NOTE — ED Provider Notes (Signed)
[name] [facility]ERGENCY DEPARTMENT AT [facility] Provider Note   CSN: [id number] Arrival date & time: 07/05/22  2238     History {Add pertinent medical, surgical, social history, OB history to HPI:1} Chief Complaint  Patient presents with   Stacey [name] is a 37 y.o. female.  HPI     This is a 37 year old female who presents after a fall.  Patient with significant history of CVA with right-sided deficits.  Currently on Plavix.  Mother reports that they were standing in a parking lot when she just fell backwards hitting her head.  She did not lose consciousness.  There was no observed syncope.  Mother states that she is at her baseline.  Of note, yesterday she had several episodes of emesis and was evaluated at [facility] for this.  Home Medications Prior to Admission medications   Medication Sig Start Date End Date Taking? Authorizing Provider  ARIPiprazole (ABILIFY) 10 MG tablet Take 1 tablet (10 mg total) by mouth daily. 06/19/22 12/16/22  Meta Hatchet, PA  Ascorbic Acid 500 MG/5ML LIQD Take by mouth. 03/05/09   [provider]  Ascorbic Acid 500 MG/5ML SYRP Take by mouth.  03/05/09   [provider]  aspirin 325 MG EC tablet Take by mouth. 04/27/15   [provider]  aspirin 325 MG tablet Take 325 mg by mouth daily.     [provider]  atorvastatin (LIPITOR) 80 MG tablet Take by mouth. 05/15/20   [provider]  atorvastatin (LIPITOR) 80 MG tablet Take by mouth. 06/29/20   [provider]  baclofen (LIORESAL) 10 MG tablet Take 10 mg by mouth 3 (three) times daily.     [provider]  benzonatate (TESSALON) 200 MG capsule TAKE ONE CAPSULE BY MOUTH THREE TIMES A DAY FOR UP TO 7 DAYS 03/28/18   [provider]  Blood Glucose Monitoring Suppl (FIFTY50 GLUCOSE METER 2.0) w/Device KIT See admin instructions. 11/09/18   [provider]  calcium-vitamin D (OSCAL WITH D) 500-200  MG-UNIT TABS tablet Take by mouth.  04/18/09   [provider]  clopidogrel (PLAVIX) 75 MG tablet Take 75 mg by mouth daily. Patient taking ASA instead    [provider]  Cysteamine Bitartrate (PROCYSBI) 300 MG PACK Use 1 each as directed. 11/09/18   [provider]  Docusate Sodium (DSS) 100 MG CAPS Take by mouth.  03/05/09   [provider]  esomeprazole (NEXIUM) 40 MG capsule Take by mouth.  03/05/09   [provider]  ferrous sulfate 300 (60 Fe) MG/5ML syrup Take by mouth.  03/05/09   [provider]  fluticasone (FLONASE) 50 MCG/ACT nasal spray Place into the nose. 11/15/19 11/14/20  [provider]  glucose blood (PRECISION QID TEST) test strip Use 1 strip as directed. 11/09/18   [provider]  Incontinence Supply Disposable (UNDERPADS) MISC 1 CASE + 5 PACKS PEACH! 06/07/13   [provider]  ketoconazole (NIZORAL) 2 % cream Apply topically daily. 08/07/20   [provider]  medroxyPROGESTERone (DEPO-PROVERA) 150 MG/ML injection Inject into the muscle.  10/20/16 03/28/20  [provider]  medroxyPROGESTERone Acetate 150 MG/ML SUSY Inject into the muscle.  09/10/19   [provider]  medroxyPROGESTERone Acetate 150 MG/ML SUSY Inject as directed See admin instructions. 12/10/19   [provider]  metoprolol tartrate (LOPRESSOR) 25 MG tablet Take by mouth. 05/15/20   [provider]  nitrofurantoin, macrocrystal-monohydrate, (MACROBID)  100 MG capsule Take 100 mg by mouth every 12 (twelve) hours. 12/16/20   [provider]  ondansetron (ZOFRAN-ODT) 8 MG disintegrating tablet 8mg  ODT q4 hours prn nausea 07/05/22   Geoffery Lyons, MD  ramipril (ALTACE) 5 MG capsule Take 5 mg by mouth daily.     [provider]  sertraline (ZOLOFT) 50 MG tablet Take 3 tablets (150 mg total) by mouth daily. 06/19/22 12/16/22  Meta Hatchet, PA  Skin Protectants, Misc. (DERMACERIN) CREA Apply  topically.  04/18/09   [provider]  valACYclovir (VALTREX) 500 MG tablet 1 tab by mouth three times daily for 5 days as needed 01/07/11   [provider]  Vitamin D, Ergocalciferol, (DRISDOL) 1.25 MG (50000 UNIT) CAPS capsule Take 50,000 Units by mouth once a week. 12/30/20   [provider]      Allergies    Metformin    Review of Systems   Review of Systems  Gastrointestinal:  Positive for nausea and vomiting.  Skin:  Negative for wound.  All other systems reviewed and are negative.   Physical Exam Updated Vital Signs Pulse 75   Resp 16   Ht 1.702 m (5\' 7" )   Wt 84.8 kg   SpO2 100%   BMI 29.28 kg/m  Physical Exam Vitals and nursing note reviewed.  Constitutional:      Appearance: She is well-developed. She is not ill-appearing.  HENT:     Head: Normocephalic and atraumatic.     Comments: No obvious hematomas    Mouth/Throat:     Mouth: Mucous membranes are moist.  Eyes:     Pupils: Pupils are equal, round, and reactive to light.  Cardiovascular:     Rate and Rhythm: Normal rate and regular rhythm.     Heart sounds: Normal heart sounds.  Pulmonary:     Effort: Pulmonary effort is normal. No respiratory distress.     Breath sounds: No wheezing.  Abdominal:     Palpations: Abdomen is soft.  Musculoskeletal:     Cervical back: Neck supple.  Skin:    General: Skin is warm and dry.  Neurological:     Mental Status: She is alert.     Comments: Aphasia noted, right-sided deficits  Psychiatric:        Mood and Affect: Mood normal.     ED Results / Procedures / Treatments   Labs (all labs ordered are listed, but only abnormal results are displayed) Labs Reviewed - No data to display  EKG None  Radiology No results found.  Procedures Procedures  {Document cardiac monitor, telemetry assessment procedure when appropriate:1}  Medications Ordered in ED Medications - No data to display  ED Course/ Medical Decision Making/ A&P    {   Click here for ABCD2, HEART and other calculatorsREFRESH Note before signing :1}                          Medical Decision Making Amount and/or Complexity of Data Reviewed Radiology: ordered.   ***  {Document critical care time when appropriate:1} {Document review of labs and clinical decision tools ie heart score, Chads2Vasc2 etc:1}  {Document your independent review of radiology images, and any outside records:1} {Document your discussion with family members, caretakers, and with consultants:1} {Document social determinants of health affecting pt's care:1} {Document your decision making why or why not admission, treatments were needed:1} Final Clinical Impression(s) / ED Diagnoses Final diagnoses:  None  Rx / DC Orders ED Discharge Orders     None

## 2022-07-06 NOTE — Discharge Instructions (Signed)
You were seen today after a fall.  Your CT scan does not show any evidence of traumatic injury.

## 2022-07-06 NOTE — ED Notes (Signed)
Patient transported to CT 

## 2022-08-18 ENCOUNTER — Encounter (HOSPITAL_COMMUNITY): Payer: Self-pay | Admitting: Physician Assistant

## 2022-08-18 ENCOUNTER — Telehealth (HOSPITAL_COMMUNITY): Payer: MEDICAID | Admitting: Physician Assistant

## 2022-08-18 DIAGNOSIS — F339 Major depressive disorder, recurrent, unspecified: Secondary | ICD-10-CM | POA: Diagnosis not present

## 2022-08-18 DIAGNOSIS — F411 Generalized anxiety disorder: Secondary | ICD-10-CM

## 2022-08-18 MED ORDER — ARIPIPRAZOLE 10 MG PO TABS
10.0000 mg | ORAL_TABLET | Freq: Every day | ORAL | 1 refills | Status: DC
Start: 2022-08-18 — End: 2022-10-22

## 2022-08-18 MED ORDER — SERTRALINE HCL 50 MG PO TABS
150.0000 mg | ORAL_TABLET | Freq: Every day | ORAL | 1 refills | Status: DC
Start: 2022-08-18 — End: 2022-10-22

## 2022-08-18 NOTE — Progress Notes (Signed)
BH MD/PA/NP OP Progress Note  Virtual Visit via Video Note  I connected with Stacey [name] on 08/18/22 at  2:30 PM EDT by a video enabled telemedicine application and verified that I am speaking with the correct person using two identifiers.  Location: Patient: Home Provider: Clinic   I discussed the limitations of evaluation and management by telemedicine and the availability of in person appointments. The patient expressed understanding and agreed to proceed.  Follow Up Instructions:  I discussed the assessment and treatment plan with the patient. The patient was provided an opportunity to ask questions and all were answered. The patient agreed with the plan and demonstrated an understanding of the instructions.   The patient was advised to call back or seek an in-person evaluation if the symptoms worsen or if the condition fails to improve as anticipated.  I provided 21 minutes of non-face-to-face time during this encounter.  Meta Hatchet, PA   08/18/2022 5:26 PM Stacey [name]  MRN:  [id number]  Chief Complaint:  Chief Complaint  Patient presents with   Follow-up   Medication Refill   HPI:   Stacey [name]. Ramp is a 37 year old African-American, female with a past psychiatric history significant for major depressive disorder and generalized anxiety disorder who presents to [facility] accompanied by her foster mother Georgiann Furio, [phone]), for follow-up and medication management.  Patient is being managed on the following psychiatric medications:  Abilify 10 mg daily Sertraline 150 mg daily  Patient presents today encounter stating that she has not been experiencing any issues or concerns regarding her current medication regimen.  She further denies experiencing any adverse side effects.  Patient denies depression and denies days where she feels sad.  She does endorse nervousness stating that she is nervous about life  but is unable to provide specifics regarding why she is nervous about life.  Patient further denies any stressors at this time.  A PHQ-9 screen was performed with the patient scoring a 17.  A GAD-7 screen was also performed with the patient scoring a 9.  Per patient's foster mother, patient has not been taking care of her teeth properly.  She reports that the patient will often refuse to take pride in the way she looks at her dress.  In addition to not properly taking care of her teeth, as patient's foster mother states that the patient will often grind her teeth.  She informed provider that the patient has Medicaid and she has been looking for dental resources for the patient.  In addition to finding dental resources, she reports that she has been working on providing the patient with a home health aide.  She reports that the patient was recently approved for services through [facility] and now they are currently looking for a company/agency to assess the patient to see the level of care the patient will need from her home health aide.  Patient is alert and oriented x 4, calm, cooperative, and fully engaged in conversation during the encounter.  Patient endorses good mood.  Patient denies suicidal or homicidal ideation.  She further denies auditory or visual hallucinations and does not appear to be responding to internal/external stimuli.  Patient endorses sleeping a lot.  She endorses increased appetite and states that she eats at least 3 meals a day.  Patient denies alcohol consumption, tobacco use, or illicit drug use.  Visit Diagnosis:    ICD-10-CM   1. Recurrent major depressive disorder, remission status unspecified (  HCC)  F33.9 ARIPiprazole (ABILIFY) 10 MG tablet    sertraline (ZOLOFT) 50 MG tablet    2. GAD (generalized anxiety disorder)  F41.1 sertraline (ZOLOFT) 50 MG tablet       Past Psychiatric History:  Diagnoses: major depressive disorder, generalized anxiety disorder, mild cognitive  impairment 2/2 cerebral infarction in 2011   Past Medical History:  Past Medical History:  Diagnosis Date   Hypertension    Stroke [facility])    History reviewed. No pertinent surgical history.  Family Psychiatric History:  Patient is adopted.  Per patient's guardian, patient does not know too much about her family psychiatric history   Family History: History reviewed. No pertinent family history.  Social History:  Social History   Socioeconomic History   Marital status: Single    Spouse name: Not on file   Number of children: Not on file   Years of education: Not on file   Highest education level: Not on file  Occupational History   Not on file  Tobacco Use   Smoking status: Never   Smokeless tobacco: Never  Vaping Use   Vaping status: Never Used  Substance and Sexual Activity   Alcohol use: No   Drug use: No   Sexual activity: Not on file  Other Topics Concern   Not on file  Social History Narrative   Not on file   Social Determinants of Health   Financial Resource Strain: Not on file  Food Insecurity: Low Risk  (12/14/2021)   [facility]   Food vital sign    Within the past 12 months, you worried that your food would run out before you got money to buy more: Never true    Within the past 12 months, the food you bought just didn't last and you didn't have money to get more: Not on file  Transportation Needs: No Transportation Needs (12/14/2021)   [facility] from [facility] visits prior to 03/05/2022., [facility] visits prior to 03/05/2022.   Transportation    In the past 12 months, has lack of reliable transportation kept you from medical appointments, meetings, work or from getting things needed for daily living?: No  Physical Activity: Not on file  Stress: Not on file  Social Connections: Not on file    Allergies:  Allergies  Allergen Reactions   Metformin Diarrhea and Other (See  Comments)    Metabolic Disorder Labs: No results found for: "HGBA1C", "MPG" No results found for: "PROLACTIN" No results found for: "CHOL", "TRIG", "HDL", "CHOLHDL", "VLDL", "LDLCALC" No results found for: "TSH"  Therapeutic Level Labs: No results found for: "LITHIUM" No results found for: "VALPROATE" No results found for: "CBMZ"  Current Medications: Current Outpatient Medications  Medication Sig Dispense Refill   ARIPiprazole (ABILIFY) 10 MG tablet Take 1 tablet (10 mg total) by mouth daily. 90 tablet 1   Ascorbic Acid 500 MG/5ML LIQD Take by mouth.     Ascorbic Acid 500 MG/5ML SYRP Take by mouth.      aspirin 325 MG EC tablet Take by mouth.     aspirin 325 MG tablet Take 325 mg by mouth daily.      atorvastatin (LIPITOR) 80 MG tablet Take by mouth.     atorvastatin (LIPITOR) 80 MG tablet Take by mouth.     baclofen (LIORESAL) 10 MG tablet Take 10 mg by mouth 3 (three) times daily.      benzonatate (TESSALON) 200 MG capsule  TAKE ONE CAPSULE BY MOUTH THREE TIMES A DAY FOR UP TO 7 DAYS     Blood Glucose Monitoring Suppl (FIFTY50 GLUCOSE METER 2.0) w/Device KIT See admin instructions.     calcium-vitamin D (OSCAL WITH D) 500-200 MG-UNIT TABS tablet Take by mouth.      clopidogrel (PLAVIX) 75 MG tablet Take 75 mg by mouth daily. Patient taking ASA instead     Cysteamine Bitartrate (PROCYSBI) 300 MG PACK Use 1 each as directed.     Docusate Sodium (DSS) 100 MG CAPS Take by mouth.      esomeprazole (NEXIUM) 40 MG capsule Take by mouth.      ferrous sulfate 300 (60 Fe) MG/5ML syrup Take by mouth.      fluticasone (FLONASE) 50 MCG/ACT nasal spray Place into the nose.     glucose blood (PRECISION QID TEST) test strip Use 1 strip as directed.     Incontinence Supply Disposable (UNDERPADS) MISC 1 CASE + 5 PACKS PEACH!     ketoconazole (NIZORAL) 2 % cream Apply topically daily.     medroxyPROGESTERone (DEPO-PROVERA) 150 MG/ML injection Inject into the muscle.      medroxyPROGESTERone  Acetate 150 MG/ML SUSY Inject into the muscle.      medroxyPROGESTERone Acetate 150 MG/ML SUSY Inject as directed See admin instructions.     metoprolol tartrate (LOPRESSOR) 25 MG tablet Take by mouth.     nitrofurantoin, macrocrystal-monohydrate, (MACROBID) 100 MG capsule Take 100 mg by mouth every 12 (twelve) hours.     ondansetron (ZOFRAN-ODT) 8 MG disintegrating tablet 8mg  ODT q4 hours prn nausea 10 tablet 0   ramipril (ALTACE) 5 MG capsule Take 5 mg by mouth daily.      sertraline (ZOLOFT) 50 MG tablet Take 3 tablets (150 mg total) by mouth daily. 270 tablet 1   Skin Protectants, Misc. (DERMACERIN) CREA Apply topically.      valACYclovir (VALTREX) 500 MG tablet 1 tab by mouth three times daily for 5 days as needed     Vitamin D, Ergocalciferol, (DRISDOL) 1.25 MG (50000 UNIT) CAPS capsule Take 50,000 Units by mouth once a week.     No current facility-administered medications for this visit.     Musculoskeletal: Strength & Muscle Tone: decreased, patient has a past history of stroke with some lingering effects present on the right side of her body.  Gait & Station:  Patient utilizes a cane and wheelchair to get ambulate Patient leans: N/A  Psychiatric Specialty Exam: Review of Systems  Psychiatric/Behavioral:  Negative for decreased concentration, dysphoric mood, hallucinations, self-injury, sleep disturbance and suicidal ideas. The patient is not nervous/anxious and is not hyperactive.     There were no vitals taken for this visit.There is no height or weight on file to calculate BMI.  General Appearance: Casual  Eye Contact:  Good  Speech:  Clear and Coherent and Normal Rate  Volume:  Normal  Mood:  Euthymic  Affect:  Appropriate  Thought Process:  Coherent and Descriptions of Associations: Intact  Orientation:  Full (Time, Place, and Person)  Thought Content: WDL   Suicidal Thoughts:  No  Homicidal Thoughts:  No  Memory:  Immediate;   Good Recent;   Fair Remote;   Fair   Judgement:  Fair  Insight:  Fair  Psychomotor Activity:  Normal  Concentration:  Concentration: Fair and Attention Span: Fair  Recall:  [employer] of Knowledge: Fair  Language: Fair  Akathisia:  No  Handed:  Left  AIMS (if indicated):  not done  Assets:  Communication Skills Desire for Improvement Housing Social Support  ADL's:  Impaired  Cognition: Impaired,  Mild  Sleep:  Good   Screenings: GAD-7    Flowsheet Row Video Visit from 08/18/2022 in [facility] Video Visit from 06/16/2022 in [facility] Clinical Support from 04/15/2022 in [facility] Counselor from 03/22/2022 in [facility] Office Visit from 02/04/2022 in [facility]  Total GAD-7 Score 9 15 15 6 12       PHQ2-9    Flowsheet Row Video Visit from 08/18/2022 in [facility] Video Visit from 06/16/2022 in [facility] Clinical Support from 04/15/2022 in [facility] Counselor from 03/22/2022 in [facility] Office Visit from 02/04/2022 in [facility]  PHQ-2 Total Score 2 4 4 2 4   PHQ-9 Total Score 17 18 13 20 13       Flowsheet Row Video Visit from 08/18/2022 in [facility] ED from 07/04/2022 in [facility] Emergency Department at [facility] Video Visit from 06/16/2022 in [facility]  C-SSRS RISK CATEGORY No Risk No Risk No Risk        Assessment and Plan:   Tiasha Horvitz. [name] is a 37 year old African-American, female with a past psychiatric history significant for major depressive disorder and generalized anxiety disorder who presents to [facility] accompanied by her foster mother Penelope Frantz, [phone]), for follow-up and medication  management.  Patient presents to the encounter reporting no issues or concerns regarding her current medication regimen.  Although patient scored a 17 on the PHQ-9 screen, she denied experiencing depressive symptoms.  She does endorse some nervousness attributed to life stressors but is unable to state specifics on what about life that stresses her out.  Per patient's foster mother, she has not noticed any issues with patient and her current medication use.  Patient is agreeable to continuing to take her medications as prescribed.  Prior to the conclusion of the encounter, patient's foster mother expressed that she is looking for resources for the patient in regards to dentistry and home health aid.  She reports that the patient was recently approved for services through [facility] and now they are looking for a company/agency to assess the patient so she can receive a home health aide.  Patient's foster mother is interested in the patient receiving therapy services.  Provider to set patient up with a licensed clinical social worker following the conclusion of the encounter.  Collaboration of Care: Collaboration of Care: Medication Management AEB provider managing patient's psychiatric medications, Primary Care Provider AEB patient being seen by primary care provider, and Psychiatrist AEB patient being followed by mental health provider  Patient/Guardian was advised Release of Information must be obtained prior to any record release in order to collaborate their care with an outside provider. Patient/Guardian was advised if they have not already done so to contact the registration department to sign all necessary forms in order for [place] to release information regarding their care.   Consent: Patient/Guardian gives verbal consent for treatment and assignment of benefits for services provided during this visit. Patient/Guardian expressed understanding and agreed to proceed.   1. Recurrent major depressive  disorder, remission status unspecified (HCC)  - ARIPiprazole (ABILIFY) 10 MG tablet; Take 1 tablet (10 mg total) by mouth daily.  Dispense: 90 tablet; Refill: 1 - sertraline (ZOLOFT) 50 MG tablet; Take 3 tablets (150 mg total) by mouth daily.  Dispense: 270 tablet; Refill: 1  2. GAD (generalized anxiety disorder)  - sertraline (ZOLOFT) 50 MG tablet; Take 3 tablets (150 mg total) by mouth daily.  Dispense: 270 tablet; Refill: 1  Patient to follow-up in 2 months Provider spent a total of 20 minutes with the patient/reviewing patient's chart  Meta Hatchet, PA 08/18/2022, 5:26 PM

## 2022-10-04 ENCOUNTER — Ambulatory Visit (INDEPENDENT_AMBULATORY_CARE_PROVIDER_SITE_OTHER): Payer: MEDICAID | Admitting: Mental Health

## 2022-10-04 DIAGNOSIS — F331 Major depressive disorder, recurrent, moderate: Secondary | ICD-10-CM

## 2022-10-04 DIAGNOSIS — G3184 Mild cognitive impairment, so stated: Secondary | ICD-10-CM

## 2022-10-04 NOTE — Progress Notes (Signed)
   THERAPIST PROGRESS NOTE  Session Time: 3:04pm ( 35 minutes)  Participation Level: Active  Behavioral Response: CasualConfusedNeutral/Adequate  Type of Therapy: Individual Therapy  Treatment Goals addressed: STG: Sharryn will improve quality of life AEB connection to needed resources for engagement in community supports and connection to care coordinator with [facility] within the next 90 days  ProgressTowards Goals: Not Progressing  Interventions: Supportive Case management  Summary: Stacey [name] is a 37 y.o. female who presents with dx of MDD moderate, GAD and cognitive impairments. [name] presents with mother, Ms. Meryle Ready who she provides verbal consent to be present. Mother does most engagement with therapist. Mother states to have believed this was a medication management appointment. States to continue to have difficulty getting [name] connected to needed community supports to support in increasing mood and decreasing feelings of stress and overwhelm on her, "[name] needs her own friends." Notes to have a Futures trader with [facility] Ms. Bria however does not hear from her much. States she was supposed to gain connection to services but has not heard back. Mother agrees for pt to not be a good fit for traditional OPT due to cognitive impairment; noting for her to get confused and difficulty processing. Shares would like her in Day programs if possible and in home support. Agrees to follow up with [employer] provided by therapist. Denies safety concerns and agrees to continue to follow up with medication management.    Suicidal/Homicidal: Nowithout intent/plan  Therapist Response: Therapist engaged Environmental manager and mother in a session and assessed for current concerns and desire to re-engage in therapy services. Explored progress on gaining access to needed services in the community. Agree to support and follow up with care management and secures ROI for Ms. Charisse Klinefelter.  Provided education on [employer] and ability to explore options for services there. Assessed for mood concerns and medication compliance. Apologize for confusion of OPT appointment being made. No safety concerns and no further appointments needed. Therapist will support with assistance with coordination of care with contact with [facility].   Plan: Return again in  0 weeks.  Diagnosis: Moderate episode of recurrent major depressive disorder (HCC)  Mild cognitive impairment  Collaboration of Care: Other [employer] Care Management,referral to [facility]  Patient/Guardian was advised Release of Information must be obtained prior to any record release in order to collaborate their care with an outside provider. Patient/Guardian was advised if they have not already done so to contact the registration department to sign all necessary forms in order for [place] to release information regarding their care.   Consent: Patient/Guardian gives verbal consent for treatment and assignment of benefits for services provided during this visit. Patient/Guardian expressed understanding and agreed to proceed.   Stephan Minister [place], [facility] 10/04/2022

## 2022-10-20 ENCOUNTER — Telehealth (INDEPENDENT_AMBULATORY_CARE_PROVIDER_SITE_OTHER): Payer: MEDICAID | Admitting: Physician Assistant

## 2022-10-20 DIAGNOSIS — F339 Major depressive disorder, recurrent, unspecified: Secondary | ICD-10-CM

## 2022-10-20 DIAGNOSIS — F411 Generalized anxiety disorder: Secondary | ICD-10-CM | POA: Diagnosis not present

## 2022-10-22 ENCOUNTER — Encounter (HOSPITAL_COMMUNITY): Payer: Self-pay | Admitting: Physician Assistant

## 2022-10-22 MED ORDER — SERTRALINE HCL 50 MG PO TABS
150.0000 mg | ORAL_TABLET | Freq: Every day | ORAL | 1 refills | Status: DC
Start: 2022-10-22 — End: 2023-04-04

## 2022-10-22 MED ORDER — ARIPIPRAZOLE 10 MG PO TABS
10.0000 mg | ORAL_TABLET | Freq: Every day | ORAL | 1 refills | Status: DC
Start: 2022-10-22 — End: 2023-04-04

## 2022-10-22 NOTE — Progress Notes (Unsigned)
BH MD/PA/NP OP Progress Note  Virtual Visit via Video Note  I connected with Stacey [name] on 10/22/22 at  2:30 PM EDT by a video enabled telemedicine application and verified that I am speaking with the correct person using two identifiers.  Location: Patient: Home Provider: Clinic   I discussed the limitations of evaluation and management by telemedicine and the availability of in person appointments. The patient expressed understanding and agreed to proceed.  Follow Up Instructions:  I discussed the assessment and treatment plan with the patient. The patient was provided an opportunity to ask questions and all were answered. The patient agreed with the plan and demonstrated an understanding of the instructions.   The patient was advised to call back or seek an in-person evaluation if the symptoms worsen or if the condition fails to improve as anticipated.  I provided 30 minutes of non-face-to-face time during this encounter.  Meta Hatchet, PA   10/22/2022 11:10 AM Stacey [name]  MRN:  [id number]  Chief Complaint:  Chief Complaint  Patient presents with   Follow-up   Medication Refill   HPI:   Stacey [name] is a 37 year old African-American, female with a past psychiatric history significant for major depressive disorder and generalized anxiety disorder who presents to [facility] accompanied by her foster mother Stacey [name], [phone]), for follow-up and medication management.  Patient is being managed on the following psychiatric medications:  Abilify 10 mg daily Sertraline 150 mg daily  Patient reports that she has been taking her medications regularly.  Patient denies experiencing any adverse side effects.  She endorses good mood and denies depressive symptoms.  She further denies anxiety.  Patient denies any new stressors at this time.  Although patient denies depressive symptoms, while performing a PHQ-9  screen, patient scored an 18.  It is unknown if patient understands the rating system when doing a PHQ-9 screen or GAD-7 screen.  During her GAD-7 screen, patient scored a 12.   Provider was able to talk to patient's foster mother prior to the conclusion of the encounter.  Her foster mother still expresses some concerns over not being able to receive resources in the form of health aides and day program for the patient.  Patient's foster mother would like for the patient to attend day program so that she will start to do more for herself and not lose any life skills.  Patient is alert and oriented x 4, calm, cooperative, and fully engaged in conversation during the encounter.  Patient endorses good mood.  Patient denies suicidal or homicidal ideations.  She further denies auditory or visual hallucinations and does not appear to be responding to internal/external stimuli.  Patient endorses good sleep and receives a lot of sleep.  Patient endorses good appetite and eats on average 2 meals per day.  Patient denies alcohol consumption, tobacco use, or illicit drug use.  Visit Diagnosis:    ICD-10-CM   1. Recurrent major depressive disorder, remission status unspecified (HCC)  F33.9 sertraline (ZOLOFT) 50 MG tablet    ARIPiprazole (ABILIFY) 10 MG tablet    2. GAD (generalized anxiety disorder)  F41.1 sertraline (ZOLOFT) 50 MG tablet       Past Psychiatric History:  Diagnoses: major depressive disorder, generalized anxiety disorder, mild cognitive impairment 2/2 cerebral infarction in 2011   Past Medical History:  Past Medical History:  Diagnosis Date   Hypertension    Stroke [facility])    History reviewed. No pertinent  surgical history.  Family Psychiatric History:  Patient is adopted.  Per patient's guardian, patient does not know too much about her family psychiatric history   Family History: History reviewed. No pertinent family history.  Social History:  Social History   Socioeconomic  History   Marital status: Single    Spouse name: Not on file   Number of children: Not on file   Years of education: Not on file   Highest education level: Not on file  Occupational History   Not on file  Tobacco Use   Smoking status: Never   Smokeless tobacco: Never  Vaping Use   Vaping status: Never Used  Substance and Sexual Activity   Alcohol use: No   Drug use: No   Sexual activity: Not on file  Other Topics Concern   Not on file  Social History Narrative   Not on file   Social Determinants of Health   Financial Resource Strain: Not on file  Food Insecurity: Low Risk  (12/14/2021)   [facility]   Hunger Vital Sign    Worried About Running Out of Food in the Last Year: Never true    Within the past 12 months, the food you bought just didn't last and you didn't have money to get more: Not on file  Transportation Needs: No Transportation Needs (12/14/2021)   [facility] from [facility] visits prior to 03/05/2022., [facility] visits prior to 03/05/2022.   Transportation    In the past 12 months, has lack of reliable transportation kept you from medical appointments, meetings, work or from getting things needed for daily living?: No  Physical Activity: Not on file  Stress: Not on file  Social Connections: Not on file    Allergies:  Allergies  Allergen Reactions   Metformin Diarrhea and Other (See Comments)    Metabolic Disorder Labs: No results found for: "HGBA1C", "MPG" No results found for: "PROLACTIN" No results found for: "CHOL", "TRIG", "HDL", "CHOLHDL", "VLDL", "LDLCALC" No results found for: "TSH"  Therapeutic Level Labs: No results found for: "LITHIUM" No results found for: "VALPROATE" No results found for: "CBMZ"  Current Medications: Current Outpatient Medications  Medication Sig Dispense Refill   ARIPiprazole (ABILIFY) 10 MG tablet Take 1 tablet (10 mg total) by mouth daily. 90  tablet 1   Ascorbic Acid 500 MG/5ML LIQD Take by mouth.     Ascorbic Acid 500 MG/5ML SYRP Take by mouth.      aspirin 325 MG EC tablet Take by mouth.     aspirin 325 MG tablet Take 325 mg by mouth daily.      atorvastatin (LIPITOR) 80 MG tablet Take by mouth.     atorvastatin (LIPITOR) 80 MG tablet Take by mouth.     baclofen (LIORESAL) 10 MG tablet Take 10 mg by mouth 3 (three) times daily.      benzonatate (TESSALON) 200 MG capsule TAKE ONE CAPSULE BY MOUTH THREE TIMES A DAY FOR UP TO 7 DAYS     Blood Glucose Monitoring Suppl (FIFTY50 GLUCOSE METER 2.0) w/Device KIT See admin instructions.     calcium-vitamin D (OSCAL WITH D) 500-200 MG-UNIT TABS tablet Take by mouth.      clopidogrel (PLAVIX) 75 MG tablet Take 75 mg by mouth daily. Patient taking ASA instead     Cysteamine Bitartrate (PROCYSBI) 300 MG PACK Use 1 each as directed.     Docusate Sodium (DSS) 100 MG CAPS Take  by mouth.      esomeprazole (NEXIUM) 40 MG capsule Take by mouth.      ferrous sulfate 300 (60 Fe) MG/5ML syrup Take by mouth.      fluticasone (FLONASE) 50 MCG/ACT nasal spray Place into the nose.     glucose blood (PRECISION QID TEST) test strip Use 1 strip as directed.     Incontinence Supply Disposable (UNDERPADS) MISC 1 CASE + 5 PACKS PEACH!     ketoconazole (NIZORAL) 2 % cream Apply topically daily.     medroxyPROGESTERone (DEPO-PROVERA) 150 MG/ML injection Inject into the muscle.      medroxyPROGESTERone Acetate 150 MG/ML SUSY Inject into the muscle.      medroxyPROGESTERone Acetate 150 MG/ML SUSY Inject as directed See admin instructions.     metoprolol tartrate (LOPRESSOR) 25 MG tablet Take by mouth.     nitrofurantoin, macrocrystal-monohydrate, (MACROBID) 100 MG capsule Take 100 mg by mouth every 12 (twelve) hours.     ondansetron (ZOFRAN-ODT) 8 MG disintegrating tablet 8mg  ODT q4 hours prn nausea 10 tablet 0   ramipril (ALTACE) 5 MG capsule Take 5 mg by mouth daily.      sertraline (ZOLOFT) 50 MG tablet Take  3 tablets (150 mg total) by mouth daily. 270 tablet 1   Skin Protectants, Misc. (DERMACERIN) CREA Apply topically.      valACYclovir (VALTREX) 500 MG tablet 1 tab by mouth three times daily for 5 days as needed     Vitamin D, Ergocalciferol, (DRISDOL) 1.25 MG (50000 UNIT) CAPS capsule Take 50,000 Units by mouth once a week.     No current facility-administered medications for this visit.     Musculoskeletal: Strength & Muscle Tone: decreased, patient has a past history of stroke with some lingering effects present on the right side of her body.  Gait & Station:  Patient utilizes a cane and wheelchair to get ambulate Patient leans: N/A  Psychiatric Specialty Exam: Review of Systems  Psychiatric/Behavioral:  Negative for decreased concentration, dysphoric mood, hallucinations, self-injury, sleep disturbance and suicidal ideas. The patient is not nervous/anxious and is not hyperactive.     There were no vitals taken for this visit.There is no height or weight on file to calculate BMI.  General Appearance: Casual  Eye Contact:  Good  Speech:  Clear and Coherent and Normal Rate  Volume:  Normal  Mood:  Euthymic  Affect:  Appropriate  Thought Process:  Coherent and Descriptions of Associations: Intact  Orientation:  Full (Time, Place, and Person)  Thought Content: WDL   Suicidal Thoughts:  No  Homicidal Thoughts:  No  Memory:  Immediate;   Good Recent;   Fair Remote;   Fair  Judgement:  Fair  Insight:  Fair  Psychomotor Activity:  Normal  Concentration:  Concentration: Fair and Attention Span: Fair  Recall:  [employer] of Knowledge: Fair  Language: Fair  Akathisia:  No  Handed:  Left  AIMS (if indicated): not done  Assets:  Communication Skills Desire for Improvement Housing Social Support  ADL's:  Impaired  Cognition: Impaired,  Mild  Sleep:  Good   Screenings: GAD-7    Flowsheet Row Video Visit from 10/20/2022 in [facility] Video Visit  from 08/18/2022 in [facility] Video Visit from 06/16/2022 in [facility] Clinical Support from 04/15/2022 in [facility] Counselor from 03/22/2022 in [facility]  Total GAD-7 Score 12 9 15 15  6  [id number]    Flowsheet Row Video Visit from 10/20/2022 in [facility] Video Visit from 08/18/2022 in [facility] Video Visit from 06/16/2022 in [facility] Clinical Support from 04/15/2022 in [facility] Counselor from 03/22/2022 in [facility]  PHQ-2 Total Score 3 2 4 4 2   PHQ-9 Total Score 18 17 18 13 20       Flowsheet Row Video Visit from 10/20/2022 in [facility] Video Visit from 08/18/2022 in [facility] ED from 07/04/2022 in [facility] Emergency Department at [facility]  C-SSRS RISK CATEGORY No Risk No Risk No Risk        Assessment and Plan:   Stacey [name] is a 37 year old African-American, female with a past psychiatric history significant for major depressive disorder and generalized anxiety disorder who presents to [facility] accompanied by her foster mother Stacey [name], [phone]), for follow-up and medication management.  Patient presents today encounter denying depressive symptoms.  She also denied symptoms of anxiety; however, when her PHQ-9 screen and GAD-7 screen was performed, patient scored an 18 and 12, respectively.  Although the patient scored relatively high during her screenings, patient did not exhibit overt depressive symptoms or anxiety during the encounter.  Patient to continue taking her medications as prescribed.  Patient's medications to be e-prescribed to pharmacy of choice.  Collaboration of Care:  Collaboration of Care: Medication Management AEB provider managing patient's psychiatric medications, Primary Care Provider AEB patient being seen by primary care provider, and Psychiatrist AEB patient being followed by mental health provider  Patient/Guardian was advised Release of Information must be obtained prior to any record release in order to collaborate their care with an outside provider. Patient/Guardian was advised if they have not already done so to contact the registration department to sign all necessary forms in order for [place] to release information regarding their care.   Consent: Patient/Guardian gives verbal consent for treatment and assignment of benefits for services provided during this visit. Patient/Guardian expressed understanding and agreed to proceed.   1. Recurrent major depressive disorder, remission status unspecified (HCC)  - sertraline (ZOLOFT) 50 MG tablet; Take 3 tablets (150 mg total) by mouth daily.  Dispense: 270 tablet; Refill: 1 - ARIPiprazole (ABILIFY) 10 MG tablet; Take 1 tablet (10 mg total) by mouth daily.  Dispense: 90 tablet; Refill: 1  2. GAD (generalized anxiety disorder)  - sertraline (ZOLOFT) 50 MG tablet; Take 3 tablets (150 mg total) by mouth daily.  Dispense: 270 tablet; Refill: 1  Patient to follow-up in 2 months Provider spent a total of 30 minutes with the patient/reviewing patient's chart  Meta Hatchet, PA 10/22/2022, 11:10 AM

## 2022-12-10 IMAGING — DX DG RIBS W/ CHEST 3+V*R*
3 series · 3 of 3 positions shown · non-contrast
Comparison: Chest radiograph on 11/28/2010

CLINICAL DATA: Fall at home today. Right rib and chest pain.
Initial encounter.

EXAM:
RIGHT RIBS AND CHEST - 3+ VIEW

[rib ap]
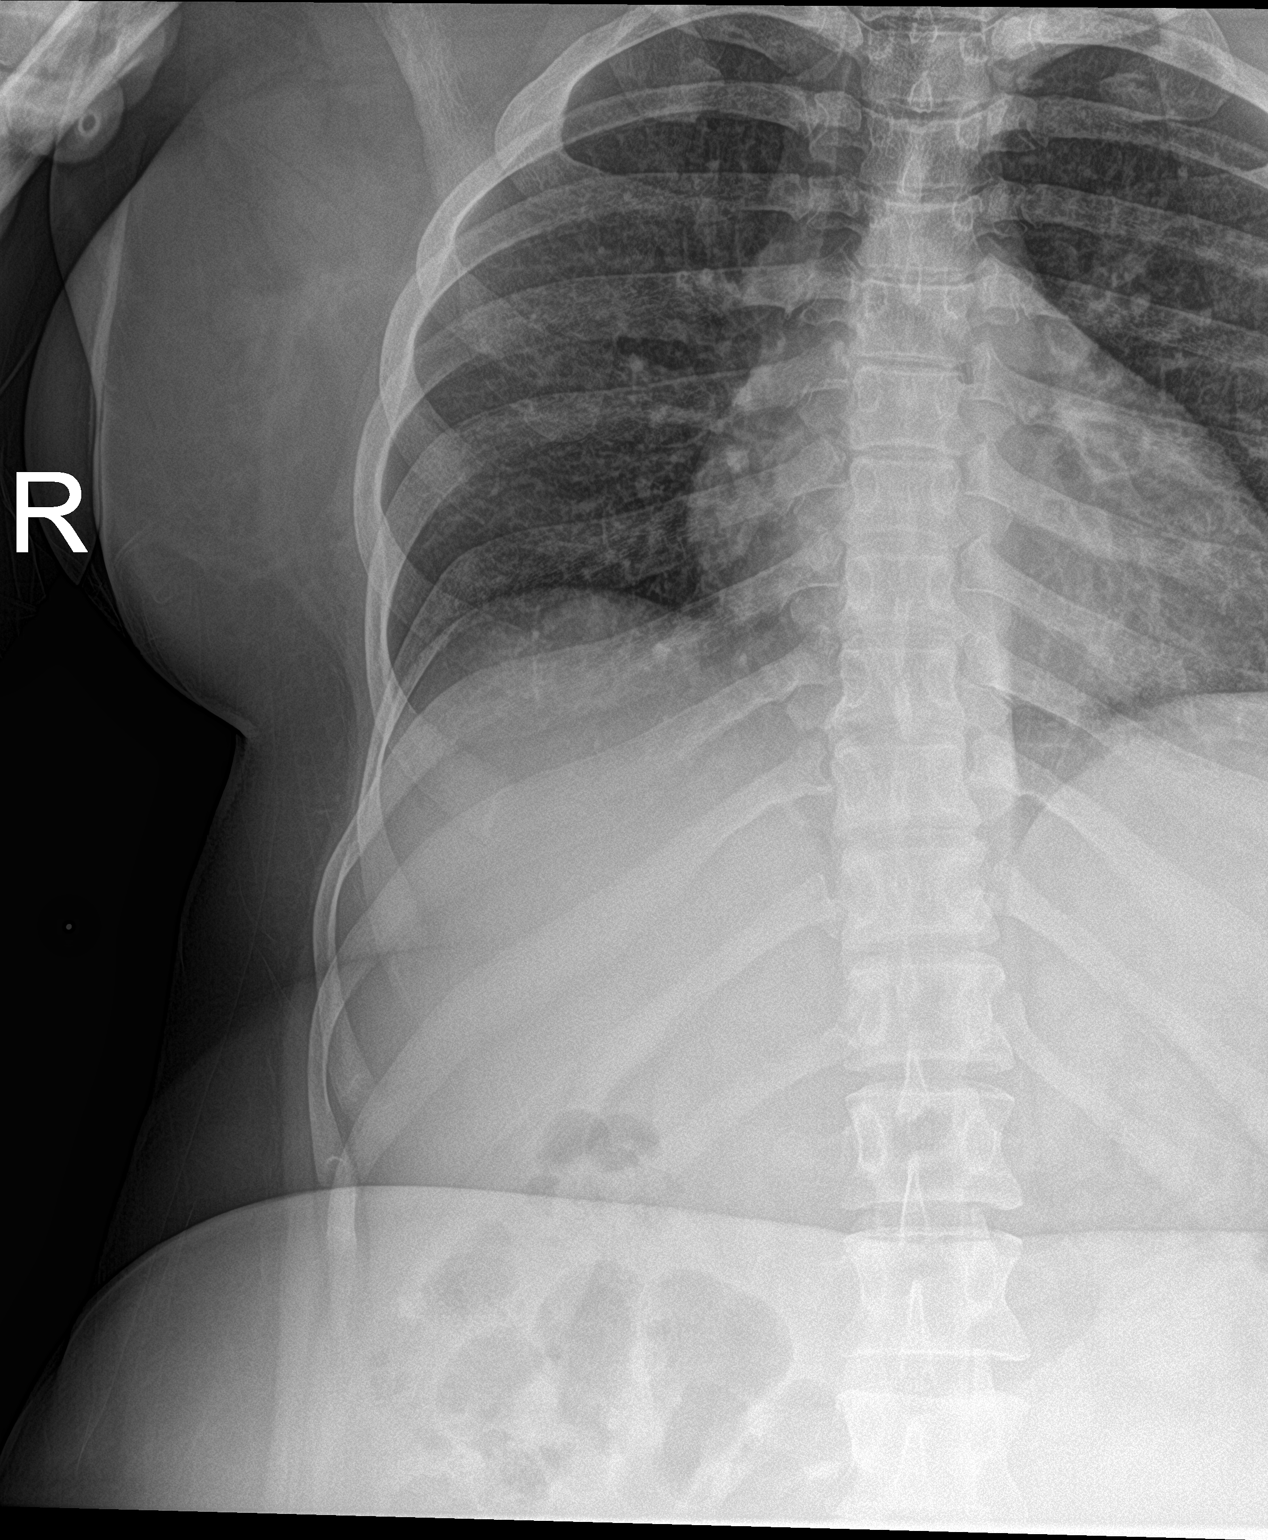

[rib ap obl]
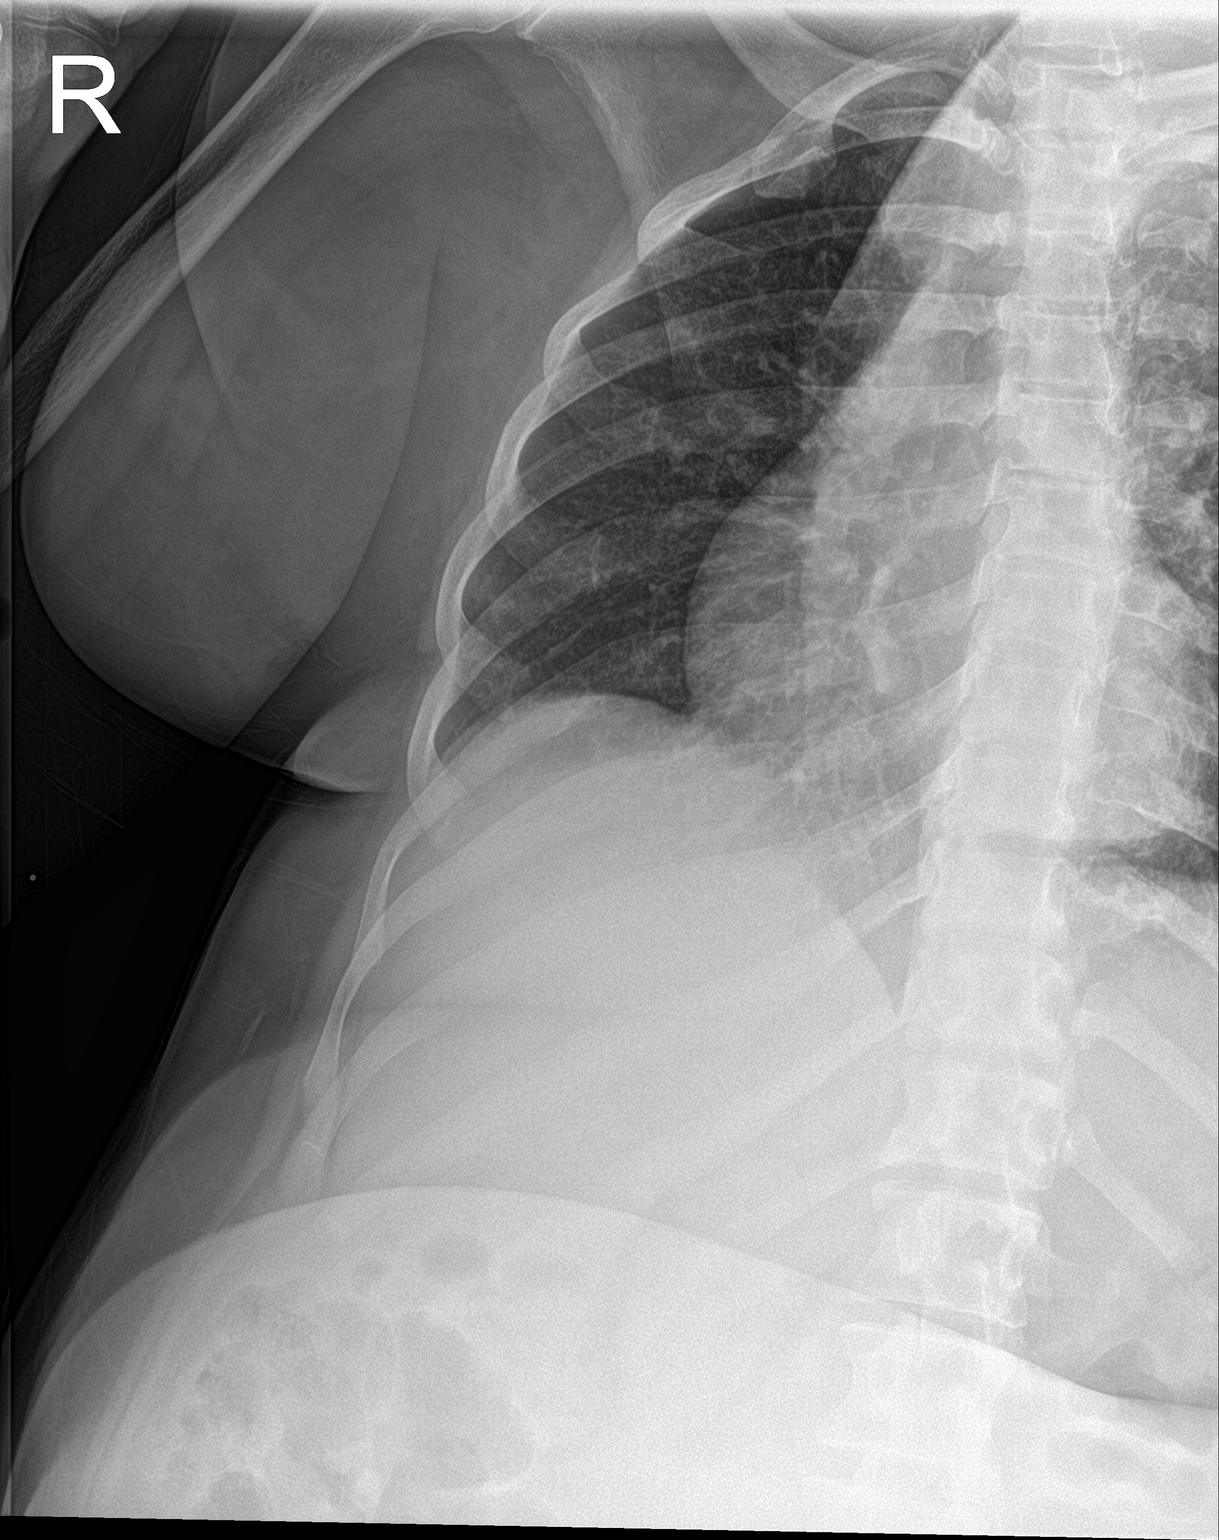

[chest ap]
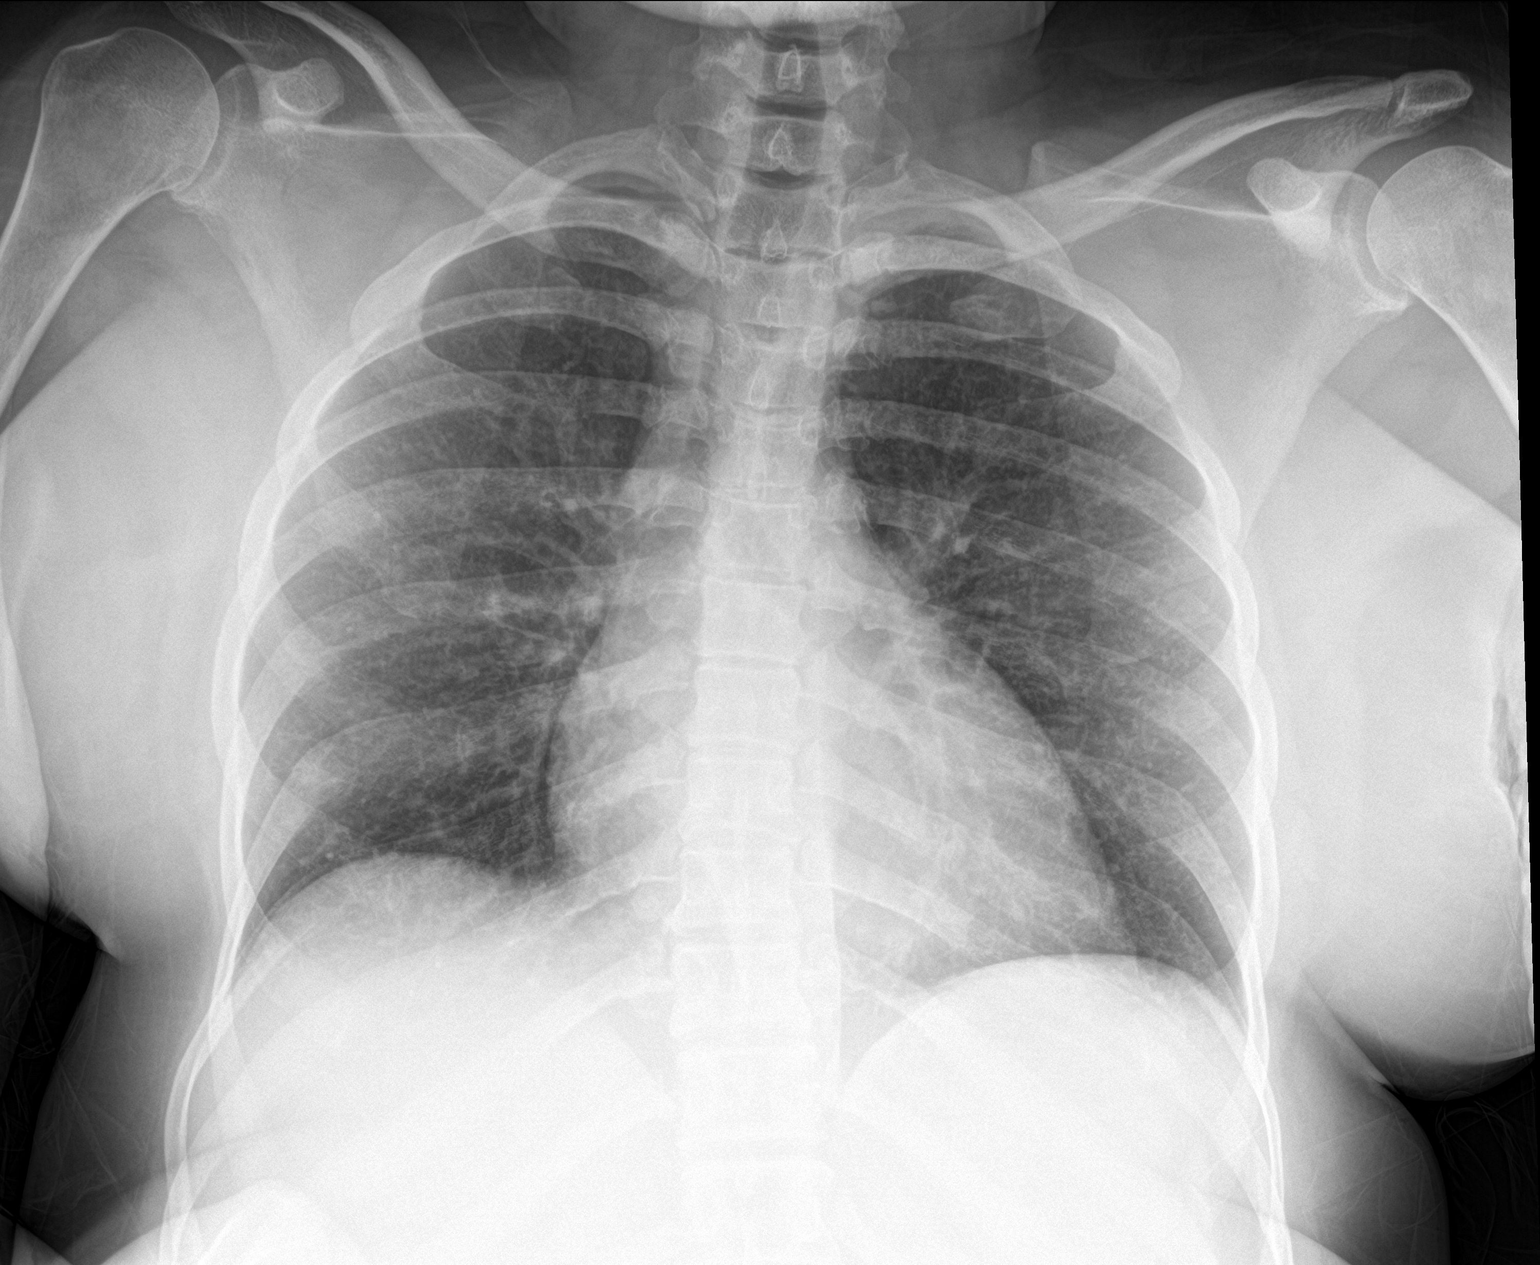

[3 of 3 positions shown; findings below may reference images not displayed]

FINDINGS: No fracture or other bone lesions are seen involving the ribs. There
is no evidence of pneumothorax or pleural effusion. Both lungs are
clear. Heart size and mediastinal contours are within normal limits.
IMPRESSION: Negative.

## 2022-12-10 IMAGING — DX DG WRIST COMPLETE 3+V*R*
4 series · 4 of 4 positions shown · non-contrast
Comparison: None.

CLINICAL DATA: Fall at home. Right wrist injury and pain. Initial
encounter.

EXAM:
RIGHT WRIST - COMPLETE 3+ VIEW

[wrist pa]
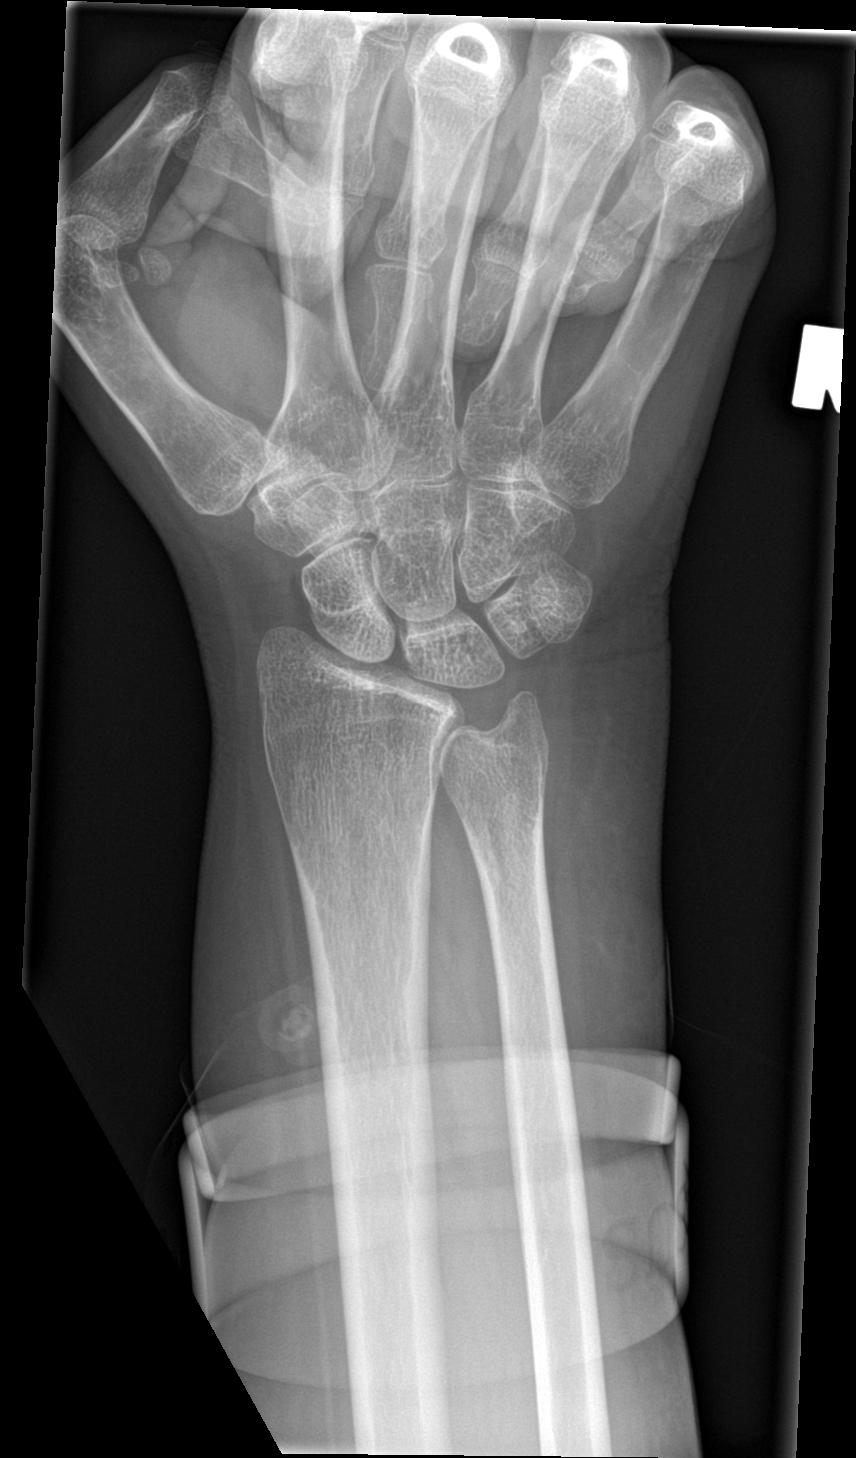

[wrist obl]
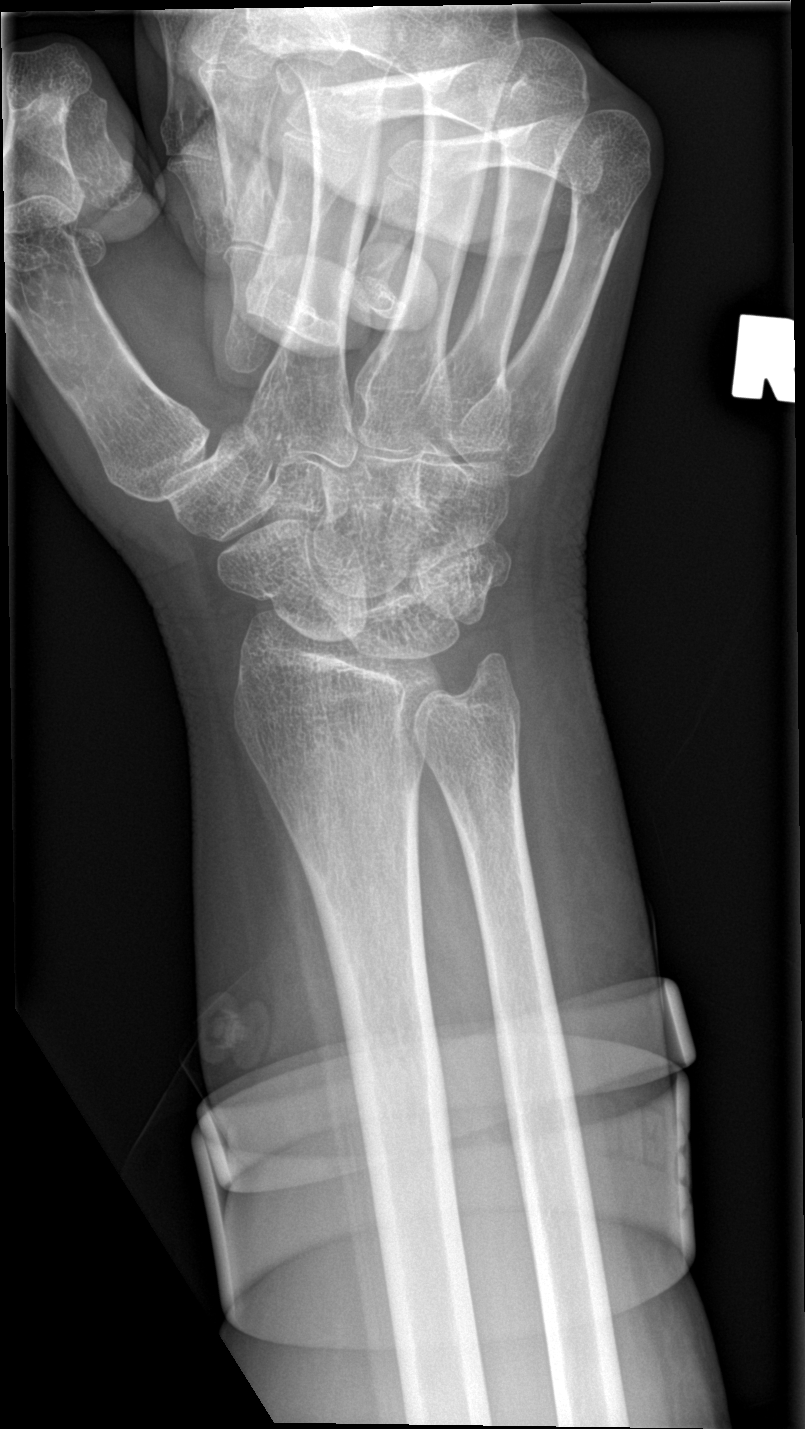

[wrist lat]
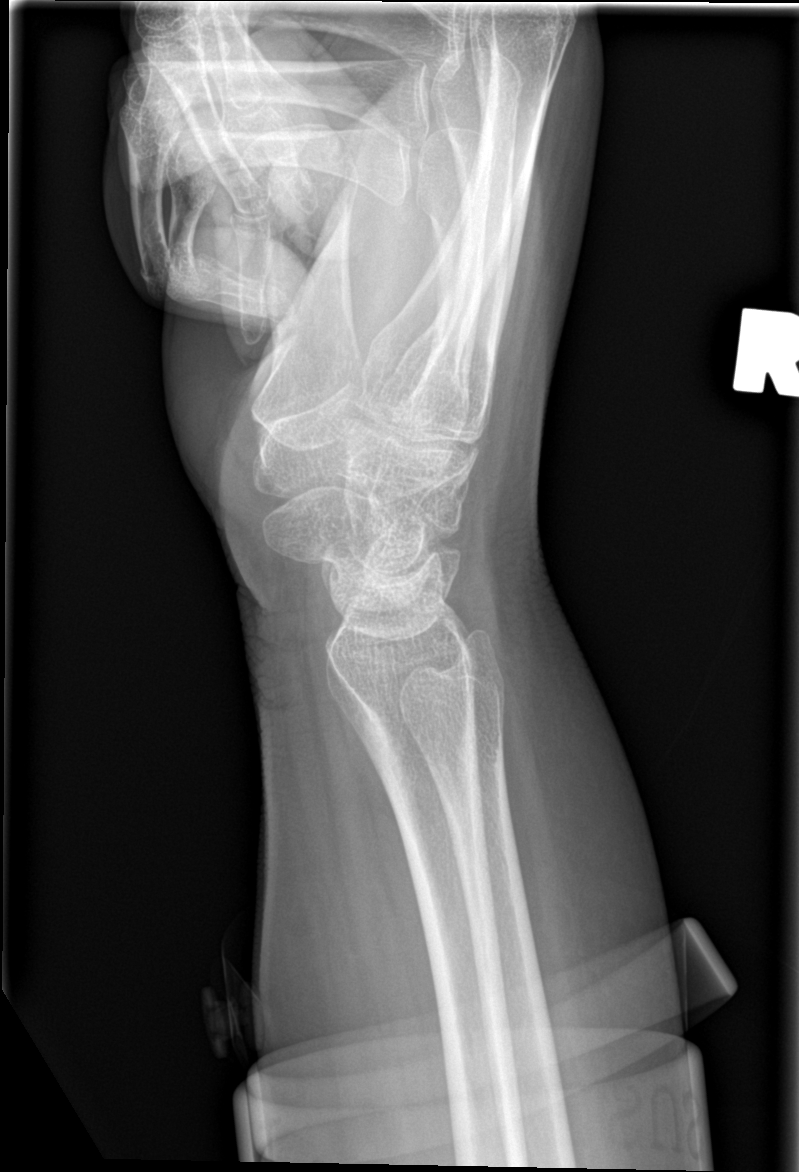

[wrist navicular]
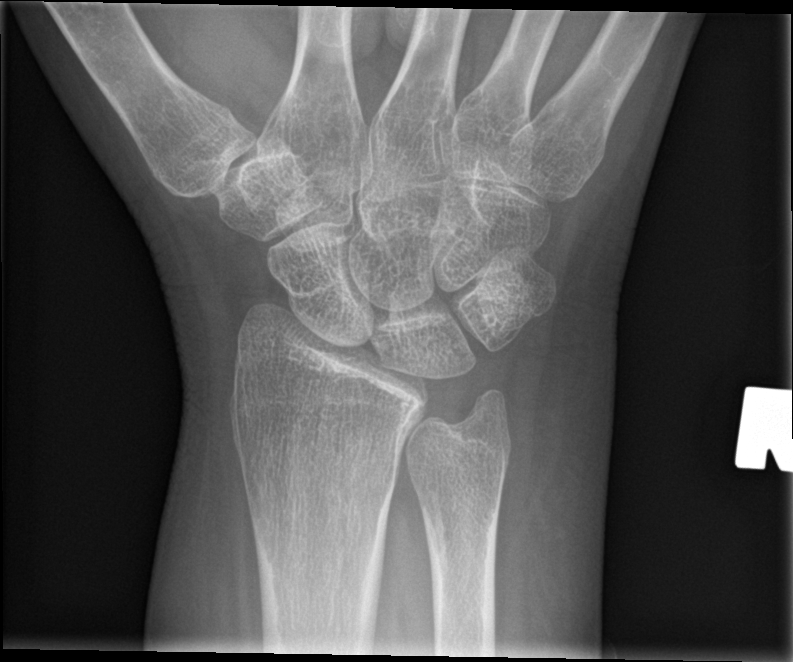

[4 of 4 positions shown; findings below may reference images not displayed]

FINDINGS: There is no evidence of fracture or dislocation. There is no
evidence of arthropathy or other focal bone abnormality. Soft
tissues are unremarkable.
IMPRESSION: Negative.

## 2022-12-10 IMAGING — DX DG KNEE COMPLETE 4+V*R*
4 series · 4 of 4 positions shown · non-contrast
Comparison: None.

CLINICAL DATA: Fall at home today. Right knee pain. Initial
encounter.

EXAM:
RIGHT KNEE - COMPLETE 4+ VIEW

[knee ap]
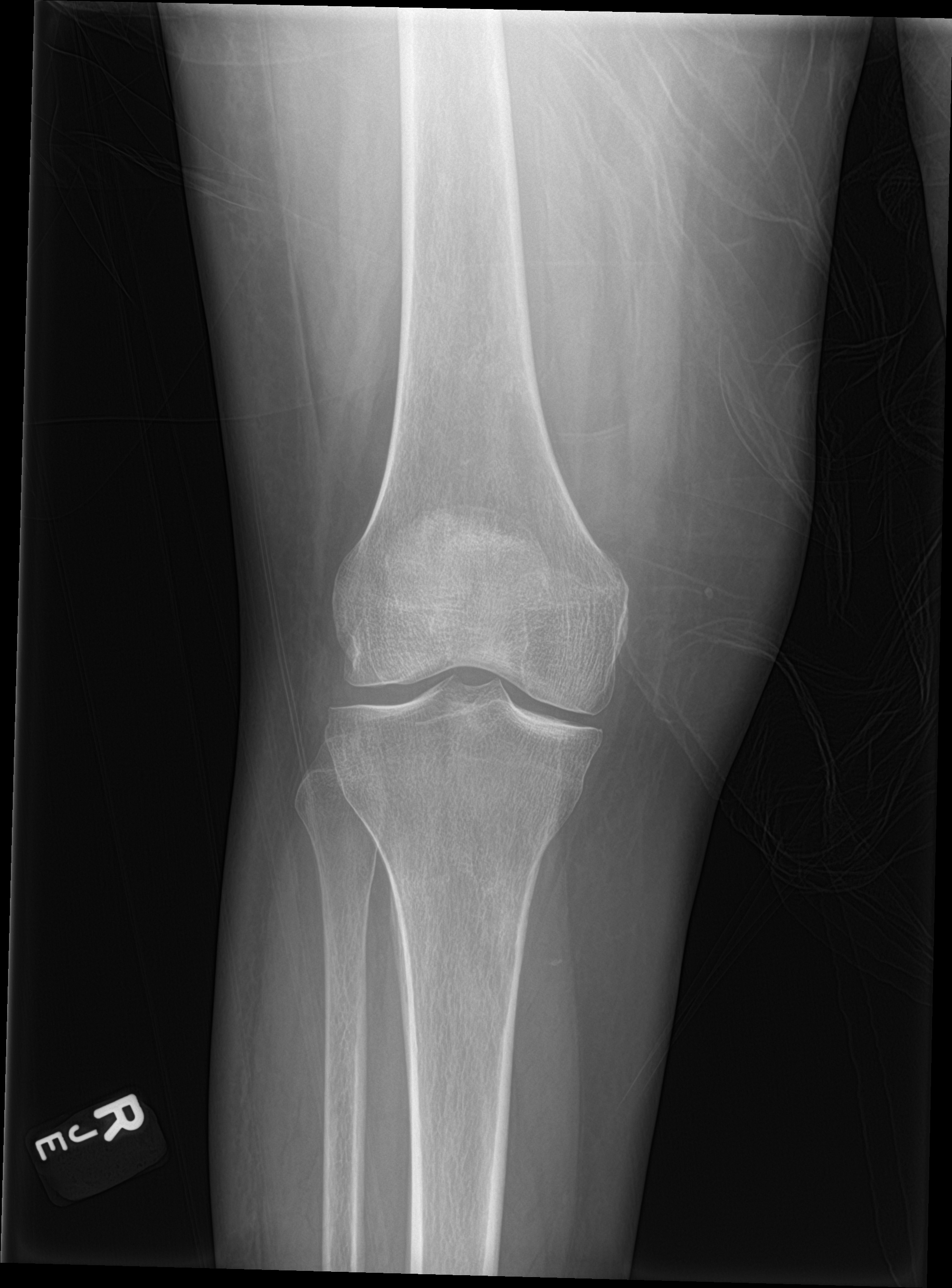

[knee lat]
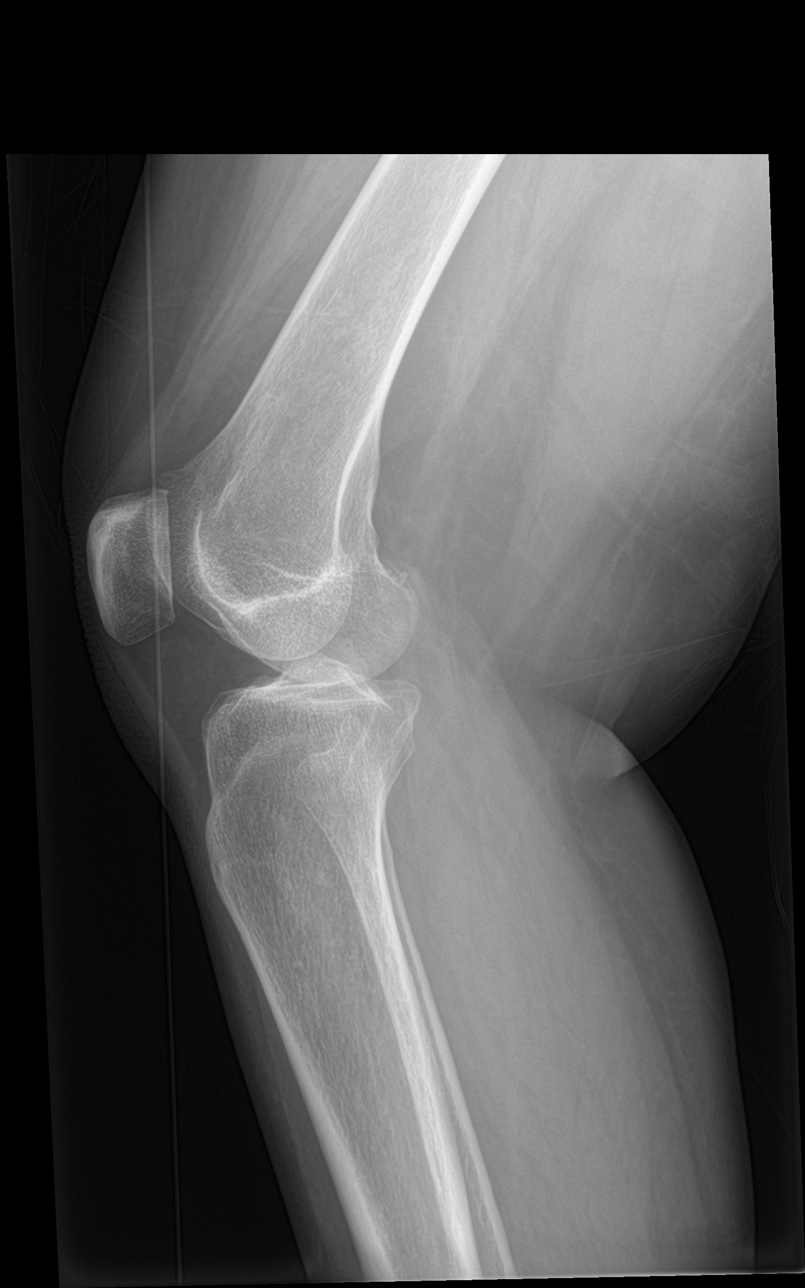

[knee obl (1 of 2)]
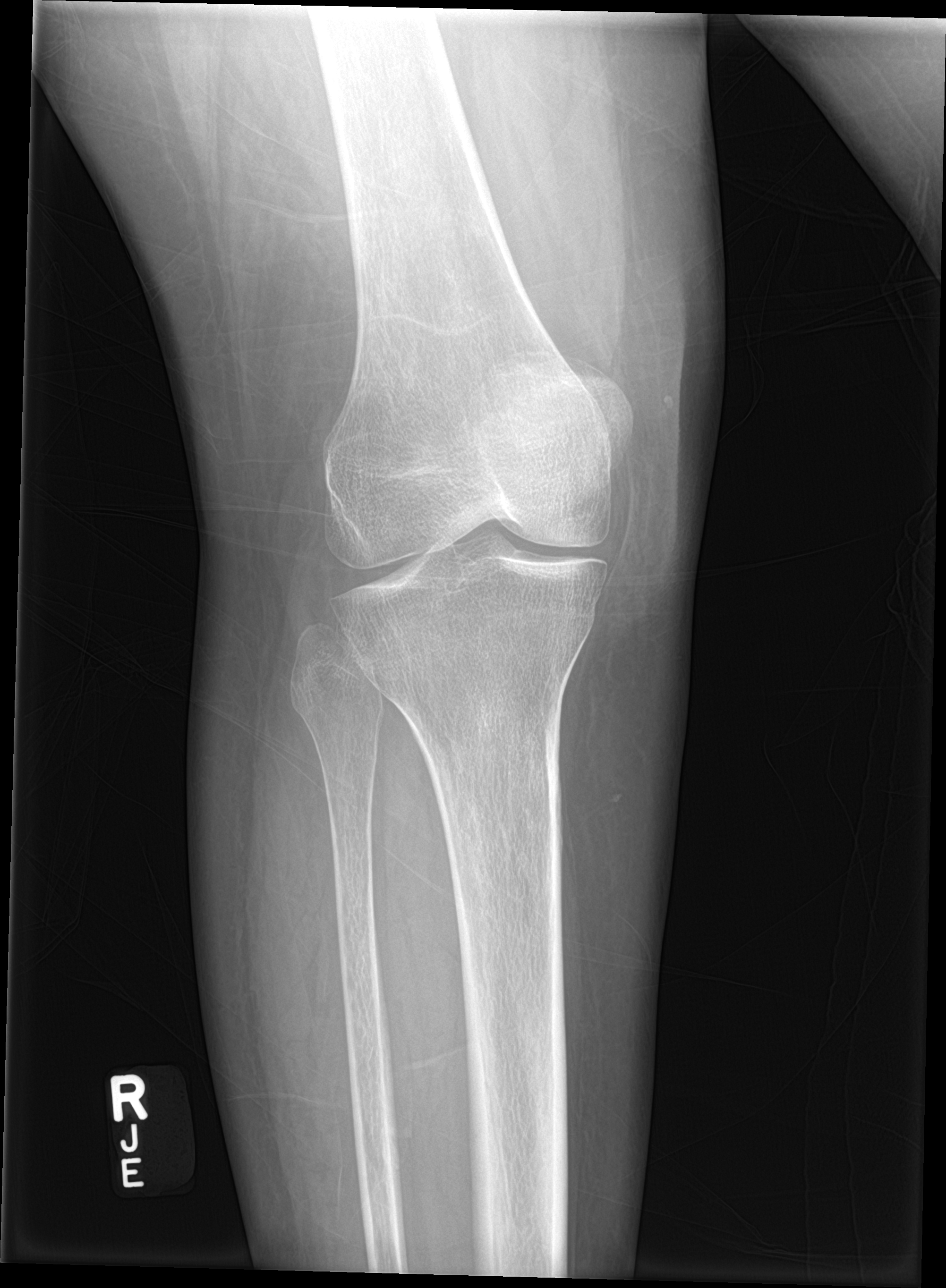

[knee obl (2 of 2)]
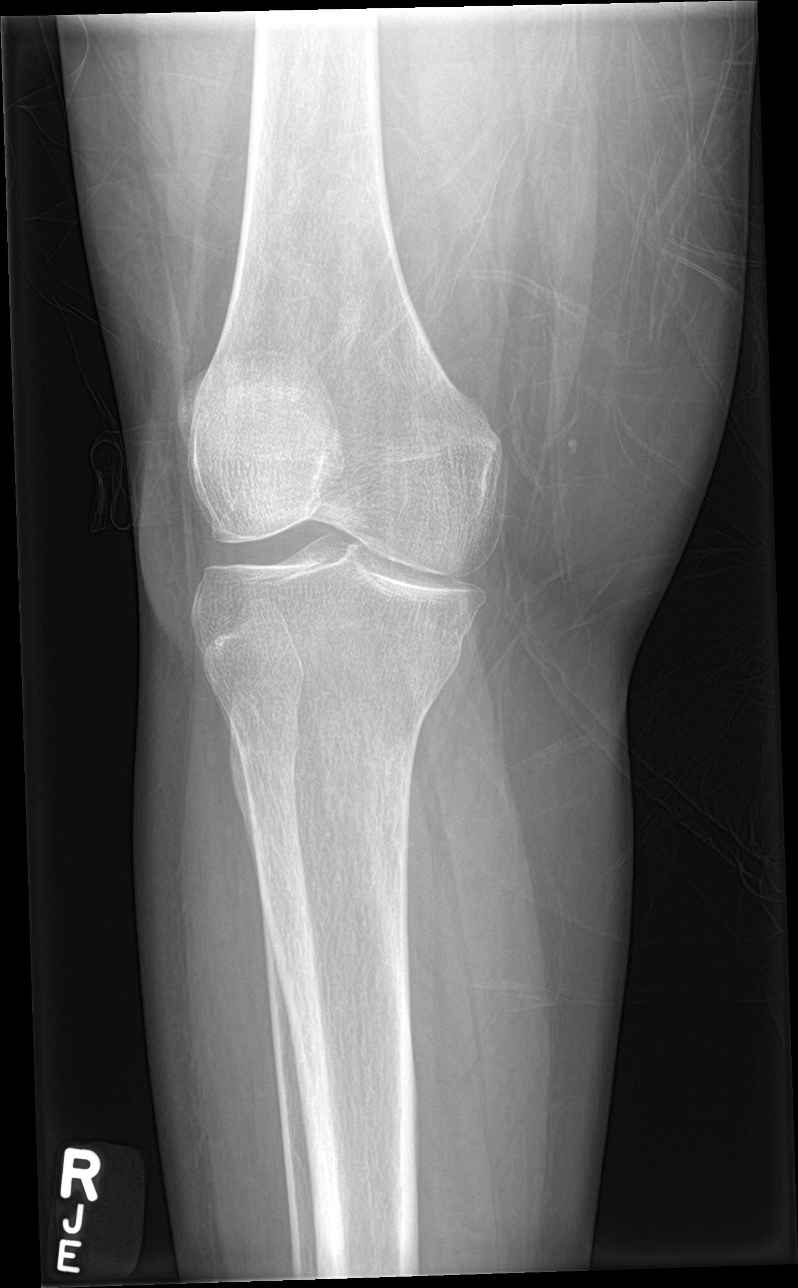

[4 of 4 positions shown; findings below may reference images not displayed]

FINDINGS: No evidence of fracture, dislocation, or joint effusion. No evidence
of arthropathy or other focal bone abnormality. Soft tissues are
unremarkable.
IMPRESSION: Negative.

## 2023-02-15 ENCOUNTER — Emergency Department (HOSPITAL_COMMUNITY)
Admission: EM | Admit: 2023-02-15 | Discharge: 2023-02-15 | Disposition: A | Discharge status: Home / Self Care | Payer: MEDICAID | Attending: Emergency Medicine | Admitting: Emergency Medicine

## 2023-02-15 ENCOUNTER — Encounter (HOSPITAL_COMMUNITY): Payer: Self-pay | Admitting: Emergency Medicine

## 2023-02-15 ENCOUNTER — Other Ambulatory Visit: Payer: Self-pay

## 2023-02-15 ENCOUNTER — Emergency Department (HOSPITAL_COMMUNITY): Payer: MEDICAID

## 2023-02-15 DIAGNOSIS — W19XXXA Unspecified fall, initial encounter: Secondary | ICD-10-CM

## 2023-02-15 DIAGNOSIS — L03116 Cellulitis of left lower limb: Secondary | ICD-10-CM | POA: Diagnosis not present

## 2023-02-15 DIAGNOSIS — R2242 Localized swelling, mass and lump, left lower limb: Secondary | ICD-10-CM | POA: Diagnosis present

## 2023-02-15 MED ORDER — ACETAMINOPHEN 500 MG PO TABS
1000.0000 mg | ORAL_TABLET | Freq: Once | ORAL | Status: AC
  Administered 2023-02-15: 1000 mg via ORAL
  Filled 2023-02-15: qty 2

## 2023-02-15 NOTE — ED Triage Notes (Signed)
Pt via [facility] after sustaining a fall at home with right arm pain from elbow to wrist. No obvious deformity noted. Previous right-sided CVA with right arm contracture.  BP 130/90 HR 84 O2 97% RA T 99 RR 18  Pt reports 10/10 pain at time of arrival to ER.

## 2023-02-15 NOTE — Discharge Instructions (Signed)
You were seen today for pain after a fall.  You do not have any obvious fracture and your evaluation was reassuring on my exam.  You should follow-up with an orthopedic if it continues to hurt over the next 3 to 5 days for further recommendations on management.  You can try acetaminophen over-the-counter for management of the symptoms until you are seen in the outpatient setting.

## 2023-02-15 NOTE — ED Provider Notes (Signed)
[place] EMERGENCY DEPARTMENT AT [facility] Provider Note   CSN: [id number] Arrival date & time: 02/15/23  1211     History No chief complaint on file.   HPI Stacey [name] is a 38 y.o. female presenting for a ground-level fall.  38 year old female minimal medical history.  Larey Seat out of bed this morning.  History of similar after her stroke.  Not on any full anticoagulation, did not hit head.  Does use Plavix. Endorses right arm pain mostly in the forearm. Patient's recorded medical, surgical, social, medication list and allergies were reviewed in the Snapshot window as part of the initial history.   Review of Systems   Review of Systems  Constitutional:  Negative for chills and fever.  HENT:  Negative for ear pain and sore throat.   Eyes:  Negative for pain and visual disturbance.  Respiratory:  Negative for cough and shortness of breath.   Cardiovascular:  Negative for chest pain and palpitations.  Gastrointestinal:  Negative for abdominal pain and vomiting.  Genitourinary:  Negative for dysuria and hematuria.  Musculoskeletal:  Negative for arthralgias and back pain.  Skin:  Negative for color change and rash.  Neurological:  Negative for seizures and syncope.  All other systems reviewed and are negative.   Physical Exam Updated Vital Signs BP 121/78 (BP Location: Left Arm)   Pulse 82   Temp 99.1 F (37.3 C) (Oral)   Resp 18   Ht 5\' 7"  (1.702 m)   Wt 84.4 kg   SpO2 100%   BMI 29.13 kg/m  Physical Exam Vitals and nursing note reviewed.  Constitutional:      General: She is not in acute distress.    Appearance: She is well-developed.  HENT:     Head: Normocephalic and atraumatic.  Eyes:     Conjunctiva/sclera: Conjunctivae normal.  Cardiovascular:     Rate and Rhythm: Normal rate and regular rhythm.     Heart sounds: No murmur heard. Pulmonary:     Effort: Pulmonary effort is normal. No respiratory distress.     Breath sounds: Normal breath  sounds.  Abdominal:     General: There is no distension.     Palpations: Abdomen is soft.     Tenderness: There is no abdominal tenderness. There is no right CVA tenderness or left CVA tenderness.  Musculoskeletal:        General: Tenderness present. No swelling, deformity or signs of injury. Normal range of motion.     Cervical back: Neck supple.  Skin:    General: Skin is warm and dry.  Neurological:     General: No focal deficit present.     Mental Status: She is alert and oriented to person, place, and time. Mental status is at baseline.     Cranial Nerves: No cranial nerve deficit.      ED Course/ Medical Decision Making/ A&P    Procedures Procedures   Medications Ordered in ED Medications  acetaminophen (TYLENOL) tablet 1,000 mg (has no administration in time range)    Medical Decision Making:   Well-appearing 38 year old female presenting after a fall out of bed.  Has right forearm pain but otherwise benign exam.  Has been ambulatory. Family members at bedside and patient consented for history collection per family. Mother states that patient has been complaining of forearm pain for days leading into this. X-ray does not show anything definitive. Patient otherwise ambulatory tolerating p.o. intake.  Disposition:  I have considered need for hospitalization,  however, considering all of the above, I believe this patient is stable for discharge at this time.  Patient/family educated about specific return precautions for given chief complaint and symptoms.  Patient/family educated about follow-up with PCP.     Patient/family expressed understanding of return precautions and need for follow-up. Patient spoken to regarding all imaging and laboratory results and appropriate follow up for these results. All education provided in verbal form with additional information in written form. Time was allowed for answering of patient questions. Patient discharged.    Emergency  Department Medication Summary:   Medications  acetaminophen (TYLENOL) tablet 1,000 mg (has no administration in time range)    Clinical Impression:  1. Fall, initial encounter      Discharge   Final Clinical Impression(s) / ED Diagnoses Final diagnoses:  Fall, initial encounter    Rx / DC Orders ED Discharge Orders     None         Glyn Ade, MD 02/15/23 [phone]

## 2023-02-21 ENCOUNTER — Telehealth (HOSPITAL_COMMUNITY): Payer: Self-pay

## 2023-02-21 NOTE — Telephone Encounter (Signed)
PT mom called this morning and LVM requesting to make an appointment for PT. There is no DPR on file for the PT mother - was unable to call PT mother back. I did try reaching out to PT to get her scheduled for in person as per Eddie - went straight to VM. LVM for PT to call our office back in order to get scheduled.

## 2023-03-09 ENCOUNTER — Encounter: Payer: Self-pay | Admitting: Physician Assistant

## 2023-03-09 ENCOUNTER — Ambulatory Visit (INDEPENDENT_AMBULATORY_CARE_PROVIDER_SITE_OTHER): Payer: MEDICAID | Admitting: Physician Assistant

## 2023-03-09 ENCOUNTER — Other Ambulatory Visit (INDEPENDENT_AMBULATORY_CARE_PROVIDER_SITE_OTHER): Payer: MEDICAID

## 2023-03-09 DIAGNOSIS — M25511 Pain in right shoulder: Secondary | ICD-10-CM

## 2023-03-09 DIAGNOSIS — M898X5 Other specified disorders of bone, thigh: Secondary | ICD-10-CM | POA: Diagnosis not present

## 2023-03-09 NOTE — Progress Notes (Signed)
 Office Visit Note   Patient: Stacey [name]           Date of Birth: 31-Aug-1985           MRN: [id number] Visit Date: 03/09/2023              Requested by: Frederic Jericho, PA-C [id number] [place],  [place] [address] PCP: Frederic Jericho, PA-C   Assessment & Plan: Visit Diagnoses:  1. Acute pain of right shoulder   2. Pain in right femur     Plan: 38 year old woman who is here with her mother who is her caretaker she has a history of a right-sided CVA in 2011.  Her mom is concerned as she seems to be falling more frequently of late.  She was followed by neurology in [place] and did do some therapy in [place].  Difficult for her mom to get her there.  Because of her increased frequency and weakness in her right side I recommended referral to neurology here in town.  Mom says she has difficulties and is unsteady with activity activities of daily living such as transferring to a commode where she has fallen before we will also give her referral to Ortho neurorehab occupational therapy  Follow-Up Instructions: No follow-ups on file.   Orders:  Orders Placed This Encounter  Procedures   XR Shoulder Right   XR FEMUR, MIN 2 VIEWS RIGHT   No orders of the defined types were placed in this encounter.     Procedures: No procedures performed   Clinical Data: No additional findings.   Subjective: Chief Complaint  Patient presents with   Right Arm - Pain    HPI patient is a 38 year old woman who is accompanied by her mother.  She is status post CVA.  She has contracture of her right hand and elbow.  She comes in today because of a fall she took a couple weeks ago.  She landed on her right side.  She says it hurts to walk with her right leg and her right arm hurts with certain positions.  She complains of pain when being helped up under her right arm.  She has no swelling had no ecchymosis.  She was seen and evaluated in the emergency room  on February 12 forearm x-ray did not show any fractures  Review of Systems  All other systems reviewed and are negative.    Objective: Vital Signs: There were no vitals taken for this visit.  Physical Exam Constitutional:      Appearance: Normal appearance.  Pulmonary:     Effort: Pulmonary effort is normal.  Skin:    General: Skin is warm and dry.  Neurological:     General: No focal deficit present.     Mental Status: She is alert and oriented to person, place, and time.  Psychiatric:        Mood and Affect: Mood normal.        Behavior: Behavior normal.     Ortho Exam Examination she has no swelling no bruising no erythema no tenderness in the right femur.  She had does have a contracture at the elbow and the hand on the right and has limited motion of the right shoulder.  It is at baseline per her and her mother.  She ambulates with a cane.  Per range of motion again at baseline Specialty Comments:  No specialty comments available.  Imaging: No results found.   PMFS History:  Patient Active Problem List   Diagnosis Date Noted   Depression 11/30/2021   Mild cognitive impairment 11/19/2021   Anemia due to stage 3a chronic kidney disease (HCC) 09/15/2021   Persistent proteinuria 09/15/2021   Anxiety 07/07/2021   GAD (generalized anxiety disorder) 04/13/2021   MDD (major depressive disorder), recurrent episode (HCC) 04/13/2021   CVA (cerebrovascular accident) (HCC) 04/24/2017   Genital herpes 04/24/2017   Hyperlipidemia, unspecified 04/24/2017   Hypertension 04/24/2017   Migraine headache 04/24/2017   Prediabetes 10/24/2014   Chronic kidney disease, stage II (mild) 10/09/2014   Absence of bladder continence 11/18/2013   Eczema 08/22/2012   Contraception 08/25/2010   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 No pertinent family history.  History reviewed. No pertinent surgical history. Social History   Occupational History   Not on file  Tobacco Use   Smoking status: Never   Smokeless tobacco: Never  Vaping Use   Vaping status: Never Used  Substance and Sexual Activity   Alcohol use: No   Drug use: No   Sexual activity: Not on file

## 2023-03-10 ENCOUNTER — Encounter: Payer: Self-pay | Admitting: Neurology

## 2023-03-21 ENCOUNTER — Ambulatory Visit: Payer: MEDICAID | Admitting: Occupational Therapy

## 2023-03-21 NOTE — Therapy (Deleted)
 OUTPATIENT OCCUPATIONAL THERAPY NEURO EVALUATION  Patient Name: Stacey [name] MRN: [id number] DOB:05-Jun-1985, 38 y.o., female Today's Date: 03/21/2023  PCP: Frederic Jericho, PA-C  REFERRING PROVIDER: Persons, [place], [place]  END OF SESSION:   Past Medical History:  Diagnosis Date   Hypertension    Stroke [facility])    No past surgical history on file. Patient Active Problem List   Diagnosis Date Noted   Depression 11/30/2021   Mild cognitive impairment 11/19/2021   Anemia due to stage 3a chronic kidney disease (HCC) 09/15/2021   Persistent proteinuria 09/15/2021   Anxiety 07/07/2021   GAD (generalized anxiety disorder) 04/13/2021   MDD (major depressive disorder), recurrent episode (HCC) 04/13/2021   CVA (cerebrovascular accident) (HCC) 04/24/2017   Genital herpes 04/24/2017   Hyperlipidemia, unspecified 04/24/2017   Hypertension 04/24/2017   Migraine headache 04/24/2017   Prediabetes 10/24/2014   Chronic kidney disease, stage II (mild) 10/09/2014   Absence of bladder continence 11/18/2013   Eczema 08/22/2012   Contraception 08/25/2010   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   Anemia 03/28/2009   Hypokalemia 03/13/2009   Hypernatremia 03/12/2009   Acute renal failure (HCC) 03/11/2009   Right hemiparesis (HCC) 01/29/2009   Thrombophilia (HCC) 01/29/2009   Acute blood loss anemia 10/23/2008   Vaginal bleeding, abnormal 10/22/2008   Hypercoagulable state (HCC) 09/23/2008   Personal history of transient ischemic attack (TIA) and cerebral infarction without residual deficit 07/30/2008    ONSET DATE: 03/09/23 (referral date)  REFERRING DIAG:  M25.511 (ICD-10-CM) - Acute pain of right shoulder  M89.8X5 (ICD-10-CM) - Pain in right femur   Per 03/09/23 referral notes: History of CVA- Right Side weakness  THERAPY DIAG:  No diagnosis found.  Rationale for Evaluation and Treatment: {HABREHAB:27488}  SUBJECTIVE:   SUBJECTIVE STATEMENT: *** Pt accompanied by:  {accompnied:27141}  PERTINENT HISTORY: s/p CVA 2011, contracture of R hand and elbow, hx of falls, depression, hx of mild cognitive impairment, anemia, anxiety, GAD, hyperlipidemia, HTN, mirgraine headache, prediabetes.   Per 03/09/23 PA Progress Notes: mother who is her caretaker... she has a history of a right-sided CVA in 2011. Her mom is concerned as she seems to be falling more frequently of late... increased frequency and weakness in her right side.Marland KitchenMarland KitchenMom says she has difficulties and is unsteady with activity activities of daily living such as transferring to a commode where she has fallen before we will also give her referral to Ortho neurorehab occupational therapy   03/09/23 XR Shoulder Right Radiographs of her right shoulder no evidence of dislocation the limited study second to patient's limitations.  No evidence of fracture no evidence of degenerative changes   03/09/23 XR Femur Right Radiographs of her right femur demonstrate no osseous lesions no fractures well-maintained alignment   02/15/23 DG Forearm Right IMPRESSION: Limited views particularly of the proximal forearm. No obvious fracture or dislocation.   02/15/23 DG Elbow Complete Right:   FINDINGS: There is no evidence of fracture, dislocation, or joint effusion. There is no evidence of arthropathy or other focal bone abnormality. Soft tissues are unremarkable. Limited frontal views due to patient positioning.  PRECAUTIONS: Fall***  WEIGHT BEARING RESTRICTIONS: {Yes ***/No:24003}  PAIN:  Are you having pain? {OPRCPAIN:27236}  FALLS: Has patient fallen in last 6 months? {fallsyesno:27318}  LIVING ENVIRONMENT: Lives with: {OPRC lives with:25569::"lives with their family"} Lives in: {Lives in:25570} Stairs: {opstairs:27293} Has following equipment at home: {Assistive devices:23999}  PLOF: {PLOF:24004}  PATIENT GOALS: ***  OBJECTIVE:  Note: Objective measures were completed at Evaluation unless otherwise  noted.  HAND DOMINANCE: {MISC; OT HAND DOMINANCE:[id number]}  ADLs: Overall ADLs: *** Transfers/ambulation related to ADLs: Eating: *** Grooming: *** UB Dressing: *** LB Dressing: *** Toileting: *** Bathing: *** Tub Shower transfers: *** Equipment: {equipment:25573}  IADLs: Shopping: *** Light housekeeping: *** Meal Prep: *** Community mobility: *** Medication management: *** Financial management: *** Handwriting: {OTWRITTENEXPRESSION:25361}  MOBILITY STATUS: {OTMOBILITY:25360}  POSTURE COMMENTS:  {posture:25561} Sitting balance: {sitting balance:25483}  ACTIVITY TOLERANCE: Activity tolerance: ***  FUNCTIONAL OUTCOME MEASURES: {OTFUNCTIONALMEASURES:27238}  UPPER EXTREMITY ROM:    {AROM/PROM:27142} ROM Right eval Left eval  Shoulder flexion    Shoulder abduction    Shoulder adduction    Shoulder extension    Shoulder internal rotation    Shoulder external rotation    Elbow flexion    Elbow extension    Wrist flexion    Wrist extension    Wrist ulnar deviation    Wrist radial deviation    Wrist pronation    Wrist supination    (Blank rows = not tested)  UPPER EXTREMITY MMT:     MMT Right eval Left eval  Shoulder flexion    Shoulder abduction    Shoulder adduction    Shoulder extension    Shoulder internal rotation    Shoulder external rotation    Middle trapezius    Lower trapezius    Elbow flexion    Elbow extension    Wrist flexion    Wrist extension    Wrist ulnar deviation    Wrist radial deviation    Wrist pronation    Wrist supination    (Blank rows = not tested)  HAND FUNCTION: {handfunction:27230}  COORDINATION: {otcoordination:27237}  SENSATION: {sensation:27233}  EDEMA: ***  MUSCLE TONE: {UETONE:25567}  COGNITION: Overall cognitive status: {cognition:24006}  VISION: Subjective report: *** Baseline vision: {OTBASELINEVISION:25363} Visual history: {OTVISUALHISTORY:25364}  VISION  ASSESSMENT: {visionassessment:27231}  Patient has difficulty with following activities due to following visual impairments: ***  PERCEPTION: {Perception:25564}  PRAXIS: {Praxis:25565}  OBSERVATIONS: ***                                                                                                                             TREATMENT DATE: ***         PATIENT EDUCATION: Education details: *** Person educated: {Person educated:25204} Education method: {Education Method:25205} Education comprehension: {Education Comprehension:25206}  HOME EXERCISE PROGRAM: ***   GOALS: Goals reviewed with patient? {yes/no:20286}  SHORT TERM GOALS: Target date: ***  *** Baseline: Goal status: INITIAL  2.  *** Baseline:  Goal status: INITIAL  3.  *** Baseline:  Goal status: INITIAL  4.  *** Baseline:  Goal status: INITIAL  5.  *** Baseline:  Goal status: INITIAL  6.  *** Baseline:  Goal status: INITIAL  LONG TERM GOALS: Target date: ***  *** Baseline:  Goal status: INITIAL  2.  *** Baseline:  Goal status: INITIAL  3.  *** Baseline:  Goal status: INITIAL  4.  *** Baseline:  Goal status: INITIAL  5.  *** Baseline:  Goal status: INITIAL  6.  *** Baseline:  Goal status: INITIAL  ASSESSMENT:  CLINICAL IMPRESSION: Patient is a *** y.o. *** who was seen today for occupational therapy evaluation for ***.   PERFORMANCE DEFICITS: in functional skills including {OT physical skills:25468}, cognitive skills including {OT cognitive skills:25469}, and psychosocial skills including {OT psychosocial skills:25470}.   IMPAIRMENTS: are limiting patient from {OT performance deficits:25471}.   CO-MORBIDITIES: {Comorbidities:25485} that affects occupational performance. Patient will benefit from skilled OT to address above impairments and improve overall function.  MODIFICATION OR ASSISTANCE TO COMPLETE EVALUATION: {OT modification:25474}  OT OCCUPATIONAL  PROFILE AND HISTORY: {OT PROFILE AND HISTORY:25484}  CLINICAL DECISION MAKING: {OT CDM:25475}  REHAB POTENTIAL: {rehabpotential:25112}  EVALUATION COMPLEXITY: {Evaluation complexity:25115}    PLAN:  OT FREQUENCY: {rehab frequency:25116}  OT DURATION: {rehab duration:25117}  PLANNED INTERVENTIONS: {OT Interventions:25467}  RECOMMENDED OTHER SERVICES: ***  CONSULTED AND AGREED WITH PLAN OF CARE: {XLK:44010}  PLAN FOR NEXT SESSION: ***   Wynetta Emery, OT 03/21/2023, 8:06 AM

## 2023-03-27 ENCOUNTER — Other Ambulatory Visit: Payer: Self-pay

## 2023-03-27 ENCOUNTER — Other Ambulatory Visit: Payer: Self-pay | Admitting: Physician Assistant

## 2023-03-27 ENCOUNTER — Telehealth: Payer: Self-pay | Admitting: Occupational Therapy

## 2023-03-27 ENCOUNTER — Ambulatory Visit: Payer: MEDICAID | Attending: Physician Assistant | Admitting: Occupational Therapy

## 2023-03-27 DIAGNOSIS — R41844 Frontal lobe and executive function deficit: Secondary | ICD-10-CM | POA: Insufficient documentation

## 2023-03-27 DIAGNOSIS — M898X5 Other specified disorders of bone, thigh: Secondary | ICD-10-CM | POA: Insufficient documentation

## 2023-03-27 DIAGNOSIS — M25511 Pain in right shoulder: Secondary | ICD-10-CM | POA: Insufficient documentation

## 2023-03-27 DIAGNOSIS — R29818 Other symptoms and signs involving the nervous system: Secondary | ICD-10-CM | POA: Insufficient documentation

## 2023-03-27 DIAGNOSIS — R29898 Other symptoms and signs involving the musculoskeletal system: Secondary | ICD-10-CM | POA: Insufficient documentation

## 2023-03-27 DIAGNOSIS — R41840 Attention and concentration deficit: Secondary | ICD-10-CM | POA: Diagnosis present

## 2023-03-27 NOTE — Therapy (Signed)
 OUTPATIENT OCCUPATIONAL THERAPY NEURO EVALUATION  Patient Name: Stacey [name] MRN: [id number] DOB:1985/06/12, 38 y.o., female Today's Date: 03/27/2023  PCP: Frederic Jericho, PA-C  REFERRING PROVIDER: Persons, [place], [place]  END OF SESSION:  OT End of Session - 03/27/23 1342     Visit Number 1    Number of Visits 13   including eval   Date for OT Re-Evaluation 05/26/23    Authorization Type [employer]    OT Start Time 1235    OT Stop Time 1315    OT Time Calculation (min) 40 min    Activity Tolerance Patient tolerated treatment well    Behavior During Therapy WFL for tasks assessed/performed             Past Medical History:  Diagnosis Date   Hypertension    Stroke (HCC)    No past surgical history on file. Patient Active Problem List   Diagnosis Date Noted   Depression 11/30/2021   Mild cognitive impairment 11/19/2021   Anemia due to stage 3a chronic kidney disease (HCC) 09/15/2021   Persistent proteinuria 09/15/2021   Anxiety 07/07/2021   GAD (generalized anxiety disorder) 04/13/2021   MDD (major depressive disorder), recurrent episode (HCC) 04/13/2021   CVA (cerebrovascular accident) (HCC) 04/24/2017   Genital herpes 04/24/2017   Hyperlipidemia, unspecified 04/24/2017   Hypertension 04/24/2017   Migraine headache 04/24/2017   Prediabetes 10/24/2014   Chronic kidney disease, stage II (mild) 10/09/2014   Absence of bladder continence 11/18/2013   Eczema 08/22/2012   Contraception 08/25/2010   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   Anemia 03/28/2009   Hypokalemia 03/13/2009   Hypernatremia 03/12/2009   Acute renal failure (HCC) 03/11/2009   Right hemiparesis (HCC) 01/29/2009   Thrombophilia (HCC) 01/29/2009   Acute blood loss anemia 10/23/2008   Vaginal bleeding, abnormal 10/22/2008   Hypercoagulable state (HCC) 09/23/2008   Personal history of transient ischemic attack (TIA) and cerebral infarction without residual deficit 07/30/2008     ONSET DATE: 03/09/23 (referral date)  REFERRING DIAG:  M25.511 (ICD-10-CM) - Acute pain of right shoulder  M89.8X5 (ICD-10-CM) - Pain in right femur   Per 03/09/23 referral notes: History of CVA- Right Side weakness  THERAPY DIAG:  Other symptoms and signs involving the nervous system  Other symptoms and signs involving the musculoskeletal system  Attention and concentration deficit  Frontal lobe and executive function deficit  Rationale for Evaluation and Treatment: Rehabilitation  SUBJECTIVE:   SUBJECTIVE STATEMENT: OT updated medication list per pt's mother report. Pt reported difficulty using R arm. Pt reported frequent falls. OT educated pt and mother on option for PT to address balance and frequent falls. Pt and mother agreeable to PT referral. Pt's mother reported considering w/c d/t falls. Pt's mother reported upcoming appointment with neurology on April 28, 2023.   Pt accompanied by: self and mother  Okey Regal)  PERTINENT HISTORY: s/p CVA 2011, contracture of R hand and elbow, hx of falls, depression, hx of mild cognitive impairment, anemia, anxiety, GAD, hyperlipidemia, HTN, mirgraine headache, prediabetes.   Per 03/09/23 PA Progress Notes: mother who is her caretaker... she has a history of a right-sided CVA in 2011. Her mom is concerned as she seems to be falling more frequently of late... increased frequency and weakness in her right side.Marland KitchenMarland KitchenMom says she has difficulties and is unsteady with activity activities of daily living such as transferring to a commode where she has fallen before we will also give her referral to Ortho neurorehab occupational therapy  03/09/23 XR Shoulder Right Radiographs of her right shoulder no evidence of dislocation the limited study second to patient's limitations.  No evidence of fracture no evidence of degenerative changes   03/09/23 XR Femur Right Radiographs of her right femur demonstrate no osseous lesions no fractures well-maintained  alignment   02/15/23 DG Forearm Right IMPRESSION: Limited views particularly of the proximal forearm. No obvious fracture or dislocation.   02/15/23 DG Elbow Complete Right:   FINDINGS: There is no evidence of fracture, dislocation, or joint effusion. There is no evidence of arthropathy or other focal bone abnormality. Soft tissues are unremarkable. Limited frontal views due to patient positioning.  PRECAUTIONS: Fall  WEIGHT BEARING RESTRICTIONS: No  PAIN:  Are you having pain? Yes: NPRS scale: Per FACES pain scale: pt pointed to 2/10 pain. Pain location: R shoulder  FALLS: Has patient fallen in last 6 months? Yes. Number of falls "a lot," more than 5. Pt reported fall yesterday, denied injuries. Pt reported fall "every week." Pt's mother reported frequency of falls is increasing "1-3 times per week." Pt's mother reported typically calling 9-1-1 for assistance to help pt get up. Per chart review: significant fall in February 2024 on R side. X-ray negative.  LIVING ENVIRONMENT: Lives with: lives with their family (mom as primary caregiver, no caregiver aid since 2020), pt's mother reported working on finding a caregiver and enrolling pt in a day program though limited d/t episodes of incontinence Lives in: House/apartment Stairs: steps using railings Has following equipment at home: Quad cane large base, Shower bench, bed side commode, and Grab bars. Pt's mother reported working on getting bathroom remodeled.  PLOF:  Per 11/17/23 OP OT Evaluation: per mom report: bladder management, perineal hygiene, wash up left side, bath independent.   Per 11/27/22 OP OT Therapy Note:  Range of Motion: AROM  Right  Shoulder abduction/elevation in flexion synergic pattern 85   PROM  Right  shoulder flexion 105  Shoulder abduction 105  Elbow extension -30  Wrist flexion 45  Wrist extension 25  Finger in flexion contracture, passive open to functional C position. Thumb IP in hyperflexion    Strength: Dynamometer on level 2  L 38, 44, 35 avg: 39 lbs   Tone: Hypertonicity- to elbow flexor, wrist flexor   PATIENT GOALS: Pt reported "hygiene." Pt's mother reported "doing some things for herself" to reduce caregiver burden.  OBJECTIVE:  Note: Objective measures were completed at Evaluation unless otherwise noted.  HAND DOMINANCE: Left since CVA several years ago (previously right handed)  ADLs: Overall ADLs:  Transfers/ambulation related to ADLs: Eating: ind Grooming: dependent for thoroughness UB Dressing: maxA, per pt's mother: "I think she could do more." LB Dressing: maxA, per pt's mother: "I think she could do more." Toileting: incontinent, unable to reach with BUE for wiping, "loose or soft stool. It's hard for her to clean herself." Bathing: max A, pt can wash some of R side using LUE Tub Shower transfers: close supervision Equipment: Metallurgist (pt does not use reacher)  IADLs: dependent  MOBILITY STATUS: Hx of falls and Pt in w/c borrowed from clinic during eval today. Pt brought quad cane.  Pt's mother reported pt sometimes attempts to stand up several times before completing sit-to-stand. Pt sometimes forgets cane around home and reverts to furniture-/wall-surfing leading to more instability and falls. Pt's mother reported pt averages falling 1-3 times per week and pt's mother has to call 9-1-1 for assistance. Pt's mother reported difficulty with helping pt  with transfers.  POSTURE COMMENTS:  Ind, rounded shoulders, pt holding R arm in protected position in lap.  ACTIVITY TOLERANCE: Activity tolerance: decreased  Pt's mother reported pt sometimes waits for help rather than attempting tasks by pt's self.  FUNCTIONAL OUTCOME MEASURES: Modified Barthel Index: 5 points    UPPER EXTREMITY ROM:    Active ROM Right eval Left eval  Shoulder flexion  WFL  Shoulder abduction Approx. 75*   Shoulder adduction    Shoulder extension     Shoulder internal rotation    Shoulder external rotation    Elbow flexion Unable AROM, maintained at 90*. PROM: WFL   Elbow extension Unable AROM, maintained at 90*. PROM: approx. -50*   Wrist flexion Impaired, maintained at neutral   Wrist extension Impaired, maintained at neutral   Wrist ulnar deviation    Wrist radial deviation    Wrist pronation    Wrist supination    (Blank rows = not tested)  Flexor synergy pattern RUE, strong flexion pattern of digits. OT noted difficulty to achieve PROM of digits d/t contracture. Pt's mother reported completing hand hygiene as able "I have to force the cloth" though confirms completion of hand hygiene.  HAND FUNCTION: Impaired RUE  COORDINATION: Impaired RUE  SENSATION: Unable to assess secondary to cognitive deficits  MUSCLE TONE: RUE: Rigidity and LUE: Within functional limits  COGNITION: Overall cognitive status: Impaired with hx of cognitive deficits and aphasia at baseline. Pt nods yes/no at times and benefits from extra time, repeated v/c, and therapist modeling to improve understanding. Pt demo'd some distractibility as evidenced by frequently looking towards noise/activity in environment.  VISION: not tested  VISION ASSESSMENT: not tested  PERCEPTION: Not tested  PRAXIS: Not tested  OBSERVATIONS: Pt wearing glasses. Pt arrived in w/c borrowed from clinic. Pt's mother assisted pt with w/c mobility. Pt carried quad cane while in w/c. Pt maintained R arm in protected position in lap and demo'd flexor synergy pattern of R arm. Pt's mother frequently expressed concerns about caregiver burden.                                                                                                                           TREATMENT DATE:   OT educated pt and pt's mother on outpt OT role, POC, option for PT referral request to address balance and fall concerns, home health PT/OT option and recommended to pt's mother to call payer source to  ask about [facility] options d/t significant and increasing number of falls at home and difficulty completing ADLs in home environment, strategies to reduce fall risk, ramp and grab bar options for safety, clinic late/no-show policy. Pt's mother acknowledged understanding of all.  At end of session, pt's mother politely declined assistance with car transfer.    PATIENT EDUCATION: Education details: see today's tx above  Person educated: Patient and mother Education method: Explanation Education comprehension: verbalized understanding  HOME EXERCISE PROGRAM: TBD  Per previous 11/24 OT POC: Access  Code: [email or web address] URL: [email or web address] Date: 11/17/2022 Prepared by: Lovenia Shuck   Exercises - Supine Shoulder Flexion AAROM with Hands Clasped - 1 x daily - 7 x weekly - 3 sets - 10 reps - Seated Shoulder Flexion AAROM with Hemiparetic UE - 6 x daily - 7 x weekly - 1 sets - 10 reps - Seated Cradle Shoulder Flexion and Horizontal Abduction PROM - 6 x daily - 7 x weekly - 1 sets - 10 reps - Seated AAROM Elbow Flexion/Extension with Clasped Hands - 6 x daily - 7 x weekly - 1 sets - 10 reps    GOALS: Goals reviewed with patient? Yes  SHORT TERM GOALS: Target date: 04/28/23  Patient and/or caregiver will demonstrate understanding updated RUE HEP with 25% verbal cues or less for proper execution and visual handouts. Baseline: previous HEP issued 11/24 OT POC Goal status: INITIAL  2.  Pt and caregiver will verbalize understanding of resting hand splint options and don/doff preferred resting hand splint of RUE with caregiver assistance PRN. Baseline: no resting hand splint, RUE flexion contracture Goal status: INITIAL  3.  Pt and/or caregiver will verbalize understanding of splint wear/care schedule. Baseline: no resting hand splint, RUE flexion contracture Goal status: INITIAL  LONG TERM GOALS: Target date: 05/26/23  Pt and caregiver will return demo of hygiene adaptive strategies  using A/E PRN for toileting and LUE hand hygiene. Baseline: Pt dependent for most ADLs/IADLs Goal status: INITIAL  2.  Pt and/or caregiver will verbalize or demo understanding of fall reduction strategies and strategies to complete safe transfers as needed for ADL toileting and showering. Baseline: pt demo's frequent falls Goal status: INITIAL  ASSESSMENT:  CLINICAL IMPRESSION: Patient is a 38 y.o. female who was seen today for occupational therapy evaluation for pain R shoulder, pain R femur, hx of CVA. OT noted chronicity of R shoulder deficits. Hx includes s/p CVA 2011, contracture of R hand and elbow, hx of falls, depression, hx of mild cognitive impairment, anemia, anxiety, GAD, hyperlipidemia, HTN, mirgraine headache, prediabetes. Patient currently presents at low level of baseline functioning demonstrating functional deficits and impairments as noted below. Pt would benefit from skilled OT services in the outpatient setting to work on impairments, to decrease caregiver burden, and to improve understanding of adaptive strategies and A/E. However, OT questioning appropriateness of Outpt vs Home health therapy d/t frequent falls at home and pt's mother reporting difficulty with managing falls and expressing concerns about caregiver burden. OT to continue to monitor if HH vs outpt more appropriate setting.  PERFORMANCE DEFICITS: in functional skills including ADLs, IADLs, coordination, dexterity, proprioception, sensation, tone, ROM, strength, pain, flexibility, Fine motor control, Gross motor control, mobility, balance, body mechanics, endurance, continence, decreased knowledge of precautions, decreased knowledge of use of DME, and UE functional use, cognitive skills including attention, energy/drive, memory, problem solving, safety awareness, sequencing, thought, and understand, and psychosocial skills including environmental adaptation, habits, and routines and behaviors.   IMPAIRMENTS: are  limiting patient from ADLs, IADLs, leisure, and social participation.   CO-MORBIDITIES: has co-morbidities such as s/p CVA 2011, contracture of R hand and elbow, hx of falls, depression, hx of mild cognitive impairment, anemia, anxiety, GAD, hyperlipidemia, HTN, mirgraine headache, prediabetes  that affects occupational performance. Patient will benefit from skilled OT to address above impairments and improve overall function.  MODIFICATION OR ASSISTANCE TO COMPLETE EVALUATION: Maximum or significant modification of tasks or assist is necessary to complete an evaluation.  OT OCCUPATIONAL PROFILE AND HISTORY: Detailed assessment: Review of  records and additional review of physical, cognitive, psychosocial history related to current functional performance.  CLINICAL DECISION MAKING: High - multiple treatment options, significant modification of task necessary  REHAB POTENTIAL: Fair d/t chronicity of symptoms  EVALUATION COMPLEXITY: High    PLAN:  OT FREQUENCY: 1-2x/week  OT DURATION: 6 weeks  PLANNED INTERVENTIONS: 97168 OT Re-evaluation, 97535 self care/ADL training, 82956 therapeutic exercise, 97530 therapeutic activity, 97112 neuromuscular re-education, 97140 manual therapy, 97035 ultrasound, 97018 paraffin, 21308 fluidotherapy, 97010 moist heat, 97010 cryotherapy, 97032 electrical stimulation (manual), 97014 electrical stimulation unattended, 97102 mechanical traction, 97760 Orthotics management and training, 65784 Splinting (initial encounter), [id number] Subsequent splinting/medication, passive range of motion, balance training, functional mobility training, visual/perceptual remediation/compensation, energy conservation, patient/family education, and DME and/or AE instructions  RECOMMENDED OTHER SERVICES: PT eval recommended, w/c eval recommended. OT questioning appropriateness of Outpt vs Home health therapy d/t frequent falls at home and pt's mother reporting difficulty with managing falls  and expressing concerns about caregiver burden. OT to continue to monitor if HH vs outpt more appropriate setting.  CONSULTED AND AGREED WITH PLAN OF CARE: Patient and family member/caregiver  PLAN FOR NEXT SESSION:  Provide list of or discuss community resources (e.g. ramps, caregiver assistance - ?[employer]) Educate caregiver and pt on RUE HEP from previous POC - adjust exercises PRN Discuss resting hand splint options for RUE   For all possible CPT codes, reference the Planned Interventions line above.     Check all conditions that are expected to impact treatment: {Conditions expected to impact treatment:Cognitive Impairment or Intellectual disability and Contractures, spasticity or fracture relevant to requested treatment   If treatment provided at initial evaluation, no treatment charged due to lack of authorization.   Wynetta Emery, OT 03/27/2023, 2:31 PM

## 2023-03-27 NOTE — Telephone Encounter (Signed)
 Good afternoon,  Stacey [name] was evaluated by OT on 03/27/23.  The patient would benefit from the following:   1) PT evaluation for address balance and frequent falls with pt's mother reporting increased frequency of falls recently.  2) W/c evaluation d/t balance deficits and frequent falls.  OT also questioning appropriateness of OT outpatient vs home health d/t frequency of falls and pt's mother reporting high level of caregiver burden with ADLs at home. Will continue POC for outpt at this time.  If you agree, please place an order in [facility] workque in [facility] or fax the order to [phone].  Thank you, Carilyn Goodpasture, OTR/L  [facility] [address] [place], [place]  [address] Phone:  [phone] Fax:  [phone]

## 2023-04-04 ENCOUNTER — Encounter: Payer: Self-pay | Admitting: Occupational Therapy

## 2023-04-04 ENCOUNTER — Encounter (HOSPITAL_COMMUNITY): Payer: Self-pay | Admitting: Physician Assistant

## 2023-04-04 ENCOUNTER — Telehealth (HOSPITAL_COMMUNITY): Payer: MEDICAID | Admitting: Physician Assistant

## 2023-04-04 ENCOUNTER — Ambulatory Visit: Payer: MEDICAID | Attending: Physician Assistant | Admitting: Occupational Therapy

## 2023-04-04 DIAGNOSIS — F339 Major depressive disorder, recurrent, unspecified: Secondary | ICD-10-CM | POA: Diagnosis not present

## 2023-04-04 DIAGNOSIS — R29818 Other symptoms and signs involving the nervous system: Secondary | ICD-10-CM

## 2023-04-04 DIAGNOSIS — Z9181 History of falling: Secondary | ICD-10-CM | POA: Insufficient documentation

## 2023-04-04 DIAGNOSIS — R41844 Frontal lobe and executive function deficit: Secondary | ICD-10-CM

## 2023-04-04 DIAGNOSIS — F411 Generalized anxiety disorder: Secondary | ICD-10-CM | POA: Diagnosis not present

## 2023-04-04 DIAGNOSIS — R41840 Attention and concentration deficit: Secondary | ICD-10-CM

## 2023-04-04 DIAGNOSIS — R2681 Unsteadiness on feet: Secondary | ICD-10-CM | POA: Insufficient documentation

## 2023-04-04 DIAGNOSIS — R29898 Other symptoms and signs involving the musculoskeletal system: Secondary | ICD-10-CM | POA: Diagnosis present

## 2023-04-04 DIAGNOSIS — M6281 Muscle weakness (generalized): Secondary | ICD-10-CM | POA: Diagnosis present

## 2023-04-04 DIAGNOSIS — R2689 Other abnormalities of gait and mobility: Secondary | ICD-10-CM | POA: Diagnosis present

## 2023-04-04 MED ORDER — ARIPIPRAZOLE 10 MG PO TABS
10.0000 mg | ORAL_TABLET | Freq: Every day | ORAL | 0 refills | Status: AC
Start: 2023-04-04 — End: 2023-10-01

## 2023-04-04 MED ORDER — SERTRALINE HCL 50 MG PO TABS
150.0000 mg | ORAL_TABLET | Freq: Every day | ORAL | 0 refills | Status: AC
Start: 2023-04-04 — End: 2023-10-01

## 2023-04-04 NOTE — Progress Notes (Unsigned)
 BH MD/PA/NP OP Progress Note  04/04/2023 9:37 PM Stacey [name]  MRN:  [id number]  Chief Complaint:  Chief Complaint  Patient presents with   Follow-up   Medication Refill   HPI:   Stacey [name] is a 38 year old African-American, female with a past psychiatric history significant for major depressive disorder and generalized anxiety disorder who presents to [facility] accompanied by her foster mother Stacey [name], [phone]), for follow-up and medication management.  Patient is being managed on the following psychiatric medications:  Abilify 10 mg daily Sertraline 150 mg daily  Patient reports no issues or concerns regarding her current medication regimen.  She denies experiencing any adverse side effects.  Patient reports that she has been experiencing depression that has been going on for a week.  Despite her depression, she reports that her medications are helpful in making her feel better.  Patient endorses depression and rates her depression as 6 out of 10 with 10 being most severe.  Patient endorses depressive episodes a few days during the week.  Patient endorses the following depressive symptoms: feelings of sadness, irritability, decreased energy, and decreased concentration.  Patient denies lack of motivation, feelings of guilt/worthlessness, or hopelessness.  In addition to depression, patient endorses anxiety but denies any new stressors at this time.  Per patient's foster mother, patient is not motivated to do things for herself or engage in activities of daily living.  For example, she reports that if the patient has the opportunity to make food for themselves, she will either avoid doing the task or put forth minimal effort when doing the task.  She reports that the patient is known to not clean up after herself resulting in her foster mother cleaning up after the patient.  Patient's foster mother states that she is extremely  concerned regarding patient's behavior and is annoyed that the patient will not do for herself.  Due to patient's foster mother having to clean up after and take care of the patient, she is burned out.  Patient's foster mother believes that she needs therapy for herself to help her accept why the patient acts the way she does.  A PHQ-9 screen was performed with the patient scoring a 24.  A GAD-7 screen was also performed with the patient scoring an 18.  Patient is alert and oriented x 4, calm, cooperative, and fully engaged in conversation during the encounter.  Patient endorses good mood.  Although patient exhibits good mood, patient scored high on both her PHQ-9 and GAD-7 screen.  Patient denies suicidal or homicidal ideations.  She further denies auditory or visual hallucinations and does not appear to be responding to internal/external stimuli.  Patient endorses good sleep stating that she sleeps a lot.  Patient endorses good appetite and eats on average 3 meals per day.  Patient denies alcohol consumption, tobacco use, or illicit drug use.  Visit Diagnosis:    ICD-10-CM   1. Recurrent major depressive disorder, remission status unspecified (HCC)  F33.9 sertraline (ZOLOFT) 50 MG tablet    ARIPiprazole (ABILIFY) 10 MG tablet    2. GAD (generalized anxiety disorder)  F41.1 sertraline (ZOLOFT) 50 MG tablet      Past Psychiatric History:  Diagnoses: major depressive disorder, generalized anxiety disorder, mild cognitive impairment 2/2 cerebral infarction in 2011   Past Medical History:  Past Medical History:  Diagnosis Date   Hypertension    Stroke [facility])    History reviewed. No pertinent surgical history.  Family  Psychiatric History:  Patient is adopted.  Per patient's guardian, patient does not know too much about her family psychiatric history   Family History: History reviewed. No pertinent family history.  Social History:  Social History   Socioeconomic History   Marital status:  Single    Spouse name: Not on file   Number of children: Not on file   Years of education: Not on file   Highest education level: Not on file  Occupational History   Not on file  Tobacco Use   Smoking status: Never   Smokeless tobacco: Never  Vaping Use   Vaping status: Never Used  Substance and Sexual Activity   Alcohol use: No   Drug use: No   Sexual activity: Not on file  Other Topics Concern   Not on file  Social History Narrative   Not on file   Social Drivers of Health   Financial Resource Strain: Not on file  Food Insecurity: Low Risk  (12/14/2021)   [facility]   Hunger Vital Sign    Worried About Running Out of Food in the Last Year: Never true    Within the past 12 months, the food you bought just didn't last and you didn't have money to get more: Not on file  Transportation Needs: No Transportation Needs (12/14/2021)   Received from [facility] visits prior to 03/05/2022., [facility] visits prior to 03/05/2022.   Transportation    In the past 12 months, has lack of reliable transportation kept you from medical appointments, meetings, work or from getting things needed for daily living?: No  Physical Activity: Not on file  Stress: Not on file  Social Connections: Not on file    Allergies:  Allergies  Allergen Reactions   Metformin Diarrhea and Other (See Comments)    Metabolic Disorder Labs: No results found for: "HGBA1C", "MPG" No results found for: "PROLACTIN" No results found for: "CHOL", "TRIG", "HDL", "CHOLHDL", "VLDL", "LDLCALC" No results found for: "TSH"  Therapeutic Level Labs: No results found for: "LITHIUM" No results found for: "VALPROATE" No results found for: "CBMZ"  Current Medications: Current Outpatient Medications  Medication Sig Dispense Refill   ARIPiprazole (ABILIFY) 10 MG tablet Take 1 tablet (10 mg total) by mouth daily. 90 tablet 0   Ascorbic Acid 500  MG/5ML LIQD Take by mouth. (Patient not taking: Reported on 03/27/2023)     Ascorbic Acid 500 MG/5ML SYRP Take by mouth.  (Patient not taking: Reported on 03/27/2023)     aspirin 325 MG EC tablet Take by mouth.     aspirin 325 MG tablet Take 325 mg by mouth daily.  (Patient not taking: Reported on 03/27/2023)     atorvastatin (LIPITOR) 80 MG tablet Take by mouth.     atorvastatin (LIPITOR) 80 MG tablet Take by mouth. (Patient not taking: Reported on 03/27/2023)     baclofen (LIORESAL) 10 MG tablet Take 10 mg by mouth 3 (three) times daily.      benzonatate (TESSALON) 200 MG capsule TAKE ONE CAPSULE BY MOUTH THREE TIMES A DAY FOR UP TO 7 DAYS (Patient not taking: Reported on 03/27/2023)     Blood Glucose Monitoring Suppl (FIFTY50 GLUCOSE METER 2.0) w/Device KIT See admin instructions. (Patient not taking: Reported on 03/27/2023)     calcium-vitamin D (OSCAL WITH D) 500-200 MG-UNIT TABS tablet Take by mouth.  (Patient not taking: Reported on 03/27/2023)     clopidogrel (PLAVIX) 75  MG tablet Take 75 mg by mouth daily. Patient taking ASA instead     Cysteamine Bitartrate (PROCYSBI) 300 MG PACK Use 1 each as directed. (Patient not taking: Reported on 03/27/2023)     Docusate Sodium (DSS) 100 MG CAPS Take by mouth.  (Patient not taking: Reported on 03/27/2023)     esomeprazole (NEXIUM) 40 MG capsule Take by mouth.  (Patient not taking: Reported on 03/27/2023)     ferrous sulfate 300 (60 Fe) MG/5ML syrup Take by mouth.  (Patient not taking: Reported on 03/27/2023)     fluticasone (FLONASE) 50 MCG/ACT nasal spray Place into the nose.     glucose blood (PRECISION QID TEST) test strip Use 1 strip as directed. (Patient not taking: Reported on 03/27/2023)     Incontinence Supply Disposable (UNDERPADS) MISC 1 CASE + 5 PACKS PEACH!     ketoconazole (NIZORAL) 2 % cream Apply topically daily. (Patient not taking: Reported on 03/27/2023)     medroxyPROGESTERone (DEPO-PROVERA) 150 MG/ML injection Inject into the muscle.       medroxyPROGESTERone Acetate 150 MG/ML SUSY Inject into the muscle.  (Patient not taking: Reported on 03/27/2023)     medroxyPROGESTERone Acetate 150 MG/ML SUSY Inject as directed See admin instructions. (Patient not taking: Reported on 03/27/2023)     metoprolol tartrate (LOPRESSOR) 25 MG tablet Take by mouth.     nitrofurantoin, macrocrystal-monohydrate, (MACROBID) 100 MG capsule Take 100 mg by mouth every 12 (twelve) hours. (Patient not taking: Reported on 03/27/2023)     ondansetron (ZOFRAN-ODT) 8 MG disintegrating tablet 8mg  ODT q4 hours prn nausea (Patient not taking: Reported on 03/27/2023) 10 tablet 0   ramipril (ALTACE) 5 MG capsule Take 5 mg by mouth daily.      sertraline (ZOLOFT) 50 MG tablet Take 3 tablets (150 mg total) by mouth daily. 270 tablet 0   Skin Protectants, Misc. (DERMACERIN) CREA Apply topically.  (Patient not taking: Reported on 03/27/2023)     valACYclovir (VALTREX) 500 MG tablet 1 tab by mouth three times daily for 5 days as needed (Patient not taking: Reported on 03/27/2023)     Vitamin D, Ergocalciferol, (DRISDOL) 1.25 MG (50000 UNIT) CAPS capsule Take 50,000 Units by mouth once a week.     No current facility-administered medications for this visit.     Musculoskeletal: Strength & Muscle Tone: decreased, patient has a past history of stroke with some lingering effects present on the right side of her body.  Gait & Station:  Patient utilizes a cane and wheelchair to ambulate Patient leans: N/A  Psychiatric Specialty Exam: Review of Systems  Psychiatric/Behavioral:  Positive for dysphoric mood. Negative for decreased concentration, hallucinations, self-injury, sleep disturbance and suicidal ideas. The patient is nervous/anxious. The patient is not hyperactive.     Blood pressure 116/72, pulse 66, height 5\' 7"  (1.702 m), weight 180 lb 6.4 oz (81.8 kg), SpO2 96%.Body mass index is 28.25 kg/m.  General Appearance: Casual  Eye Contact:  Good  Speech:  Clear and Coherent  and Normal Rate  Volume:  Normal  Mood:  Depressed  Affect:  Appropriate  Thought Process:  Coherent and Descriptions of Associations: Intact  Orientation:  Full (Time, Place, and Person)  Thought Content: WDL   Suicidal Thoughts:  No  Homicidal Thoughts:  No  Memory:  Immediate;   Good Recent;   Fair Remote;   Fair  Judgement:  Fair  Insight:  Fair  Psychomotor Activity:  Normal  Concentration:  Concentration: Fair and Attention Span: Fair  Recall:  Jennelle Human of Knowledge: Fair  Language: Fair  Akathisia:  No  Handed:  Left  AIMS (if indicated): not done  Assets:  Communication Skills Desire for Improvement Housing Social Support  ADL's:  Impaired  Cognition: Impaired,  Mild  Sleep:  Good   Screenings: GAD-7    Flowsheet Row Video Visit from 04/04/2023 in [facility] Video Visit from 10/20/2022 in [facility] Video Visit from 08/18/2022 in [facility] Video Visit from 06/16/2022 in [facility] Clinical Support from 04/15/2022 in [facility]  Total GAD-7 Score 18 12 9 15 15       PHQ2-9    Flowsheet Row Video Visit from 04/04/2023 in [facility] Video Visit from 10/20/2022 in [facility] Video Visit from 08/18/2022 in [facility] Video Visit from 06/16/2022 in [facility] Clinical Support from 04/15/2022 in [facility]  PHQ-2 Total Score 6 3 2 4 4   PHQ-9 Total Score 24 18 17 18 13       Flowsheet Row Video Visit from 04/04/2023 in [facility] ED from 02/15/2023 in [facility] Video Visit from 10/20/2022 in [facility]  C-SSRS RISK CATEGORY No Risk No Risk No Risk        Assessment and Plan:    Glyn Zendejas. Ramey is a 38 year old African-American, female with a past psychiatric history significant for major depressive disorder and generalized anxiety disorder who presents to [facility] accompanied by her foster mother Stacey [name], [phone]), for follow-up and medication management.  Patient presents to the encounter reporting no issues or concerns regarding her current medication regimen.  Patient reports that she has been taking her medications as prescribed and denies experiencing any adverse side effects at this time.  Though patient exhibits good mood, she endorse going depression as been going on for a week as well as some anxiety.  Patient scored a 24 on the PHQ-9 screen and an 18 on her GAD-7 screen.  Despite scoring high on her PHQ-9 and GAD-7 screen, patient presents euthymic during the encounter.  Patient to continue taking her medications as prescribed.  Patient's foster mother expressed concerns over the patient's lack of motivation and her unwillingness to engage in her activities of daily living.Patient's foster mother informed provider that she was tired of doing things for the patient that she could do for herself. Patient's foster mother appears to be burnt out over patient not properly taking care of herself.  Due to patient's use of Abilify, provider to obtain the following labs during her next encounter: Hemoglobin A1c, lipid profile, complete metabolic panel, and complete blood count with differential.  Patient vocalized understanding.  Collaboration of Care: Collaboration of Care: Medication Management AEB provider managing patient's psychiatric medications, Primary Care Provider AEB patient being seen by primary care provider, and Psychiatrist AEB patient being followed by mental health provider  Patient/Guardian was advised Release of Information must be obtained prior to any record release in order to collaborate their  care with an outside provider. Patient/Guardian was advised if they have not already done so to contact the registration department to sign all necessary forms in order for [place] to release information regarding their care.   Consent: Patient/Guardian gives verbal consent for treatment and assignment of  benefits for services provided during this visit. Patient/Guardian expressed understanding and agreed to proceed.   1. Recurrent major depressive disorder, remission status unspecified (HCC)  - sertraline (ZOLOFT) 50 MG tablet; Take 3 tablets (150 mg total) by mouth daily.  Dispense: 270 tablet; Refill: 0 - ARIPiprazole (ABILIFY) 10 MG tablet; Take 1 tablet (10 mg total) by mouth daily.  Dispense: 90 tablet; Refill: 0  2. GAD (generalized anxiety disorder)  - sertraline (ZOLOFT) 50 MG tablet; Take 3 tablets (150 mg total) by mouth daily.  Dispense: 270 tablet; Refill: 0  Patient to follow-up in 6 weeks Provider spent a total of 41 minutes with the patient/reviewing patient's chart  Meta Hatchet, PA 04/04/2023, 9:37 PM

## 2023-04-04 NOTE — Therapy (Signed)
 OUTPATIENT OCCUPATIONAL THERAPY NEURO EVALUATION  Patient Name: Stacey [name] MRN: [id number] DOB:12-15-1985, 38 y.o., female Today's Date: 04/04/2023  PCP: Frederic Jericho, PA-C  REFERRING PROVIDER: Persons, [place], [place]  END OF SESSION:  OT End of Session - 04/04/23 1248     Visit Number 2    Number of Visits 13   including eval   Date for OT Re-Evaluation 05/26/23    Authorization Type [employer]    OT Start Time 1235    OT Stop Time 1315    OT Time Calculation (min) 40 min    Activity Tolerance Patient tolerated treatment well    Behavior During Therapy WFL for tasks assessed/performed             Past Medical History:  Diagnosis Date   Hypertension    Stroke [facility])    History reviewed. No pertinent surgical history. Patient Active Problem List   Diagnosis Date Noted   Depression 11/30/2021   Mild cognitive impairment 11/19/2021   Anemia due to stage 3a chronic kidney disease (HCC) 09/15/2021   Persistent proteinuria 09/15/2021   Anxiety 07/07/2021   GAD (generalized anxiety disorder) 04/13/2021   MDD (major depressive disorder), recurrent episode (HCC) 04/13/2021   CVA (cerebrovascular accident) (HCC) 04/24/2017   Genital herpes 04/24/2017   Hyperlipidemia, unspecified 04/24/2017   Hypertension 04/24/2017   Migraine headache 04/24/2017   Prediabetes 10/24/2014   Chronic kidney disease, stage II (mild) 10/09/2014   Absence of bladder continence 11/18/2013   Eczema 08/22/2012   Contraception 08/25/2010   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   Anemia 03/28/2009   Hypokalemia 03/13/2009   Hypernatremia 03/12/2009   Acute renal failure (HCC) 03/11/2009   Right hemiparesis (HCC) 01/29/2009   Thrombophilia (HCC) 01/29/2009   Acute blood loss anemia 10/23/2008   Vaginal bleeding, abnormal 10/22/2008   Hypercoagulable state (HCC) 09/23/2008   Personal history of transient ischemic attack (TIA) and cerebral infarction without residual deficit  07/30/2008    ONSET DATE: 03/09/23 (referral date)  REFERRING DIAG:  M25.511 (ICD-10-CM) - Acute pain of right shoulder  M89.8X5 (ICD-10-CM) - Pain in right femur   Per 03/09/23 referral notes: History of CVA- Right Side weakness  THERAPY DIAG:  Other symptoms and signs involving the nervous system  Other symptoms and signs involving the musculoskeletal system  Attention and concentration deficit  Frontal lobe and executive function deficit  Rationale for Evaluation and Treatment: Rehabilitation  SUBJECTIVE:   SUBJECTIVE STATEMENT: OT updated medication list per pt's mother report. Pt reported difficulty using R arm. Pt reported frequent falls. OT educated pt and mother on option for PT to address balance and frequent falls. Pt and mother agreeable to PT referral. Pt's mother reported considering w/c d/t falls. Pt's mother reported upcoming appointment with neurology on April 28, 2023.   Pt accompanied by: self and mother  Okey Regal)  PERTINENT HISTORY: s/p CVA 2011, contracture of R hand and elbow, hx of falls, depression, hx of mild cognitive impairment, anemia, anxiety, GAD, hyperlipidemia, HTN, mirgraine headache, prediabetes.   Per 03/09/23 PA Progress Notes: mother who is her caretaker... she has a history of a right-sided CVA in 2011. Her mom is concerned as she seems to be falling more frequently of late... increased frequency and weakness in her right side.Marland KitchenMarland KitchenMom says she has difficulties and is unsteady with activity activities of daily living such as transferring to a commode where she has fallen before we will also give her referral to Ortho neurorehab occupational therapy  03/09/23 XR Shoulder Right Radiographs of her right shoulder no evidence of dislocation the limited study second to patient's limitations.  No evidence of fracture no evidence of degenerative changes   03/09/23 XR Femur Right Radiographs of her right femur demonstrate no osseous lesions no fractures  well-maintained alignment   02/15/23 DG Forearm Right IMPRESSION: Limited views particularly of the proximal forearm. No obvious fracture or dislocation.   02/15/23 DG Elbow Complete Right:   FINDINGS: There is no evidence of fracture, dislocation, or joint effusion. There is no evidence of arthropathy or other focal bone abnormality. Soft tissues are unremarkable. Limited frontal views due to patient positioning.  PRECAUTIONS: Fall  WEIGHT BEARING RESTRICTIONS: No  PAIN:  Are you having pain? Yes: NPRS scale: Per FACES pain scale: pt pointed to 2/10 pain. Pain location: R shoulder  FALLS: Has patient fallen in last 6 months? Yes. Number of falls "a lot," more than 5. Pt reported fall yesterday, denied injuries. Pt reported fall "every week." Pt's mother reported frequency of falls is increasing "1-3 times per week." Pt's mother reported typically calling 9-1-1 for assistance to help pt get up. Per chart review: significant fall in February 2024 on R side. X-ray negative.  LIVING ENVIRONMENT: Lives with: lives with their family (mom as primary caregiver, no caregiver aid since 2020), pt's mother reported working on finding a caregiver and enrolling pt in a day program though limited d/t episodes of incontinence Lives in: House/apartment Stairs: steps using railings Has following equipment at home: Quad cane large base, Shower bench, bed side commode, and Grab bars. Pt's mother reported working on getting bathroom remodeled.  PLOF:  Per 11/17/23 OP OT Evaluation: per mom report: bladder management, perineal hygiene, wash up left side, bath independent.   Per 11/27/22 OP OT Therapy Note:  Range of Motion: AROM  Right  Shoulder abduction/elevation in flexion synergic pattern 85   PROM  Right  shoulder flexion 105  Shoulder abduction 105  Elbow extension -30  Wrist flexion 45  Wrist extension 25  Finger in flexion contracture, passive open to functional C position. Thumb IP in  hyperflexion   Strength: Dynamometer on level 2  L 38, 44, 35 avg: 39 lbs   Tone: Hypertonicity- to elbow flexor, wrist flexor   PATIENT GOALS: Pt reported "hygiene." Pt's mother reported "doing some things for herself" to reduce caregiver burden.  OBJECTIVE:  Note: Objective measures were completed at Evaluation unless otherwise noted.  HAND DOMINANCE: Left since CVA several years ago (previously right handed)  ADLs: Overall ADLs:  Transfers/ambulation related to ADLs: Eating: ind Grooming: dependent for thoroughness UB Dressing: maxA, per pt's mother: "I think she could do more." LB Dressing: maxA, per pt's mother: "I think she could do more." Toileting: incontinent, unable to reach with BUE for wiping, "loose or soft stool. It's hard for her to clean herself." Bathing: max A, pt can wash some of R side using LUE Tub Shower transfers: close supervision Equipment: Metallurgist (pt does not use reacher)  IADLs: dependent  MOBILITY STATUS: Hx of falls and Pt in w/c borrowed from clinic during eval today. Pt brought quad cane.  Pt's mother reported pt sometimes attempts to stand up several times before completing sit-to-stand. Pt sometimes forgets cane around home and reverts to furniture-/wall-surfing leading to more instability and falls. Pt's mother reported pt averages falling 1-3 times per week and pt's mother has to call 9-1-1 for assistance. Pt's mother reported difficulty with helping pt  with transfers.  POSTURE COMMENTS:  Ind, rounded shoulders, pt holding R arm in protected position in lap.  ACTIVITY TOLERANCE: Activity tolerance: decreased  Pt's mother reported pt sometimes waits for help rather than attempting tasks by pt's self.  FUNCTIONAL OUTCOME MEASURES: Modified Barthel Index: 5 points    UPPER EXTREMITY ROM:    Active ROM Right eval Left eval  Shoulder flexion  WFL  Shoulder abduction Approx. 75*   Shoulder adduction    Shoulder  extension    Shoulder internal rotation    Shoulder external rotation    Elbow flexion Unable AROM, maintained at 90*. PROM: WFL   Elbow extension Unable AROM, maintained at 90*. PROM: approx. -50*   Wrist flexion Impaired, maintained at neutral   Wrist extension Impaired, maintained at neutral   Wrist ulnar deviation    Wrist radial deviation    Wrist pronation    Wrist supination    (Blank rows = not tested)  Flexor synergy pattern RUE, strong flexion pattern of digits. OT noted difficulty to achieve PROM of digits d/t contracture. Pt's mother reported completing hand hygiene as able "I have to force the cloth" though confirms completion of hand hygiene.  HAND FUNCTION: Impaired RUE  COORDINATION: Impaired RUE  SENSATION: Unable to assess secondary to cognitive deficits  MUSCLE TONE: RUE: Rigidity and LUE: Within functional limits  COGNITION: Overall cognitive status: Impaired with hx of cognitive deficits and aphasia at baseline. Pt nods yes/no at times and benefits from extra time, repeated v/c, and therapist modeling to improve understanding. Pt demo'd some distractibility as evidenced by frequently looking towards noise/activity in environment.  VISION: not tested  VISION ASSESSMENT: not tested  PERCEPTION: Not tested  PRAXIS: Not tested  OBSERVATIONS: Pt wearing glasses. Pt arrived in w/c borrowed from clinic. Pt's mother assisted pt with w/c mobility. Pt carried quad cane while in w/c. Pt maintained R arm in protected position in lap and demo'd flexor synergy pattern of R arm. Pt's mother frequently expressed concerns about caregiver burden.                                                                                                                           TREATMENT DATE: 04/04/23  Discussed community resources ([employer], [employer], DME access, medical transportation, and additional resources ([employer]).   Pt's mother had multiple questions about home health therapy  and when/how that works w/ outpatient, respite for caregiver services, grab bar construction options. Therapist explained that she cannot receive home health and outpatient at the same time nor could she have P.T. in home while getting O.T. here. Pt would benefit from home health therapy for further home evaluation and safety in home, but recommended finishing therapy in clinic here/work on goals that can be addressed here first. Pt instructed to also call [facility] to see what other available resources might be available.    PATIENT EDUCATION: Education details: see today's tx above  Person educated: Patient and mother  Education method: Explanation, Verbal cues, and Handouts Education comprehension: verbalized understanding and needs further education  HOME EXERCISE PROGRAM: TBD  Per previous 11/24 OT POC: Access Code: [email or web address] URL: [email or web address] Date: 11/17/2022 Prepared by: Lovenia Shuck   Exercises - Supine Shoulder Flexion AAROM with Hands Clasped - 1 x daily - 7 x weekly - 3 sets - 10 reps - Seated Shoulder Flexion AAROM with Hemiparetic UE - 6 x daily - 7 x weekly - 1 sets - 10 reps - Seated Cradle Shoulder Flexion and Horizontal Abduction PROM - 6 x daily - 7 x weekly - 1 sets - 10 reps - Seated AAROM Elbow Flexion/Extension with Clasped Hands - 6 x daily - 7 x weekly - 1 sets - 10 reps    GOALS: Goals reviewed with patient? Yes  SHORT TERM GOALS: Target date: 04/28/23  Patient and/or caregiver will demonstrate understanding updated RUE HEP with 25% verbal cues or less for proper execution and visual handouts. Baseline: previous HEP issued 11/24 OT POC Goal status: INITIAL  2.  Pt and caregiver will verbalize understanding of resting hand splint options and don/doff preferred resting hand splint of RUE with caregiver assistance PRN. Baseline: no resting hand splint, RUE flexion contracture Goal status: INITIAL  3.  Pt and/or caregiver will verbalize understanding  of splint wear/care schedule. Baseline: no resting hand splint, RUE flexion contracture Goal status: INITIAL  LONG TERM GOALS: Target date: 05/26/23  Pt and caregiver will return demo of hygiene adaptive strategies using A/E PRN for toileting and LUE hand hygiene. Baseline: Pt dependent for most ADLs/IADLs Goal status: INITIAL  2.  Pt and/or caregiver will verbalize or demo understanding of fall reduction strategies and strategies to complete safe transfers as needed for ADL toileting and showering. Baseline: pt demo's frequent falls Goal status: INITIAL  ASSESSMENT:  CLINICAL IMPRESSION: Patient seen today for occupational therapy treatment. Focus today on education in community resources, however pt's mother has several questions that O.T. cannot answer and would benefit from a Child psychotherapist. Evaluation: OT noted chronicity of R shoulder deficits. Hx includes s/p CVA 2011, contracture of R hand and elbow, hx of falls, depression, hx of mild cognitive impairment, anemia, anxiety, GAD, hyperlipidemia, HTN, mirgraine headache, prediabetes. Patient currently presents at low level of baseline functioning demonstrating functional deficits and impairments as noted below. Pt would benefit from skilled OT services in the outpatient setting to work on impairments, to decrease caregiver burden, and to improve understanding of adaptive strategies and A/E. However, OT questioning appropriateness of Outpt vs Home health therapy d/t frequent falls at home and pt's mother reporting difficulty with managing falls and expressing concerns about caregiver burden. OT to continue to monitor if HH vs outpt more appropriate setting.  PERFORMANCE DEFICITS: in functional skills including ADLs, IADLs, coordination, dexterity, proprioception, sensation, tone, ROM, strength, pain, flexibility, Fine motor control, Gross motor control, mobility, balance, body mechanics, endurance, continence, decreased knowledge of  precautions, decreased knowledge of use of DME, and UE functional use, cognitive skills including attention, energy/drive, memory, problem solving, safety awareness, sequencing, thought, and understand, and psychosocial skills including environmental adaptation, habits, and routines and behaviors.   IMPAIRMENTS: are limiting patient from ADLs, IADLs, leisure, and social participation.   CO-MORBIDITIES: has co-morbidities such as s/p CVA 2011, contracture of R hand and elbow, hx of falls, depression, hx of mild cognitive impairment, anemia, anxiety, GAD, hyperlipidemia, HTN, mirgraine headache, prediabetes  that affects occupational performance. Patient will benefit from skilled OT to address above impairments  and improve overall function.  MODIFICATION OR ASSISTANCE TO COMPLETE EVALUATION: Maximum or significant modification of tasks or assist is necessary to complete an evaluation.  OT OCCUPATIONAL PROFILE AND HISTORY: Detailed assessment: Review of records and additional review of physical, cognitive, psychosocial history related to current functional performance.  CLINICAL DECISION MAKING: High - multiple treatment options, significant modification of task necessary  REHAB POTENTIAL: Fair d/t chronicity of symptoms  EVALUATION COMPLEXITY: High    PLAN:  OT FREQUENCY: 1-2x/week  OT DURATION: 6 weeks  PLANNED INTERVENTIONS: 97168 OT Re-evaluation, 97535 self care/ADL training, 54098 therapeutic exercise, 97530 therapeutic activity, 97112 neuromuscular re-education, 97140 manual therapy, 97035 ultrasound, 97018 paraffin, 11914 fluidotherapy, 97010 moist heat, 97010 cryotherapy, 97032 electrical stimulation (manual), 97014 electrical stimulation unattended, 97102 mechanical traction, 97760 Orthotics management and training, 78295 Splinting (initial encounter), [id number] Subsequent splinting/medication, passive range of motion, balance training, functional mobility training, visual/perceptual  remediation/compensation, energy conservation, patient/family education, and DME and/or AE instructions  RECOMMENDED OTHER SERVICES: PT eval recommended, w/c eval recommended. OT questioning appropriateness of Outpt vs Home health therapy d/t frequent falls at home and pt's mother reporting difficulty with managing falls and expressing concerns about caregiver burden. OT to continue to monitor if HH vs outpt more appropriate setting.  CONSULTED AND AGREED WITH PLAN OF CARE: Patient and family member/caregiver  PLAN FOR NEXT SESSION:  Educate caregiver and pt on RUE HEP from previous POC - adjust exercises PRN Discuss resting hand splint options for RUE   For all possible CPT codes, reference the Planned Interventions line above.     Check all conditions that are expected to impact treatment: {Conditions expected to impact treatment:Cognitive Impairment or Intellectual disability and Contractures, spasticity or fracture relevant to requested treatment   If treatment provided at initial evaluation, no treatment charged due to lack of authorization.   Sheran Lawless, OT 04/04/2023, 12:50 PM

## 2023-04-06 ENCOUNTER — Ambulatory Visit: Payer: MEDICAID | Admitting: Physical Therapy

## 2023-04-06 ENCOUNTER — Ambulatory Visit: Payer: MEDICAID | Admitting: Occupational Therapy

## 2023-04-06 ENCOUNTER — Encounter: Payer: Self-pay | Admitting: Physical Therapy

## 2023-04-06 DIAGNOSIS — R2681 Unsteadiness on feet: Secondary | ICD-10-CM

## 2023-04-06 DIAGNOSIS — R29818 Other symptoms and signs involving the nervous system: Secondary | ICD-10-CM | POA: Diagnosis not present

## 2023-04-06 DIAGNOSIS — R41840 Attention and concentration deficit: Secondary | ICD-10-CM

## 2023-04-06 DIAGNOSIS — Z9181 History of falling: Secondary | ICD-10-CM

## 2023-04-06 DIAGNOSIS — R29898 Other symptoms and signs involving the musculoskeletal system: Secondary | ICD-10-CM

## 2023-04-06 DIAGNOSIS — M6281 Muscle weakness (generalized): Secondary | ICD-10-CM

## 2023-04-06 DIAGNOSIS — R2689 Other abnormalities of gait and mobility: Secondary | ICD-10-CM

## 2023-04-06 DIAGNOSIS — R41844 Frontal lobe and executive function deficit: Secondary | ICD-10-CM

## 2023-04-06 NOTE — Patient Instructions (Signed)
 PROM of fingers (straighten) Use left hand to help straighten right fingers to as straight as possible. Hold 20 seconds (count out loud!) 2x per day, 2 sets  PROM of thumb (straighten) Use left hand to help straighten right thumb to as straight as possible.  Hold 20 seconds (count out loud!) 2x per day, 2 sets

## 2023-04-06 NOTE — Therapy (Signed)
 OUTPATIENT OCCUPATIONAL THERAPY NEURO Treatment  Patient Name: Stacey [name] MRN: [id number] DOB:1985/02/17, 38 y.o., female Today's Date: 04/06/2023  PCP: Frederic Jericho, PA-C  REFERRING PROVIDER: Persons, [place], [place]  END OF SESSION:  OT End of Session - 04/06/23 1410     Visit Number 3    Number of Visits 13   including eval   Date for OT Re-Evaluation 05/26/23    Authorization Type [employer]    OT Start Time 1317    OT Stop Time 1405    OT Time Calculation (min) 48 min    Activity Tolerance Patient tolerated treatment well    Behavior During Therapy WFL for tasks assessed/performed              Past Medical History:  Diagnosis Date   Hypertension    Stroke (HCC)    No past surgical history on file. Patient Active Problem List   Diagnosis Date Noted   Depression 11/30/2021   Mild cognitive impairment 11/19/2021   Anemia due to stage 3a chronic kidney disease (HCC) 09/15/2021   Persistent proteinuria 09/15/2021   Anxiety 07/07/2021   GAD (generalized anxiety disorder) 04/13/2021   MDD (major depressive disorder), recurrent episode (HCC) 04/13/2021   CVA (cerebrovascular accident) (HCC) 04/24/2017   Genital herpes 04/24/2017   Hyperlipidemia, unspecified 04/24/2017   Hypertension 04/24/2017   Migraine headache 04/24/2017   Prediabetes 10/24/2014   Chronic kidney disease, stage II (mild) 10/09/2014   Absence of bladder continence 11/18/2013   Eczema 08/22/2012   Contraception 08/25/2010   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   Anemia 03/28/2009   Hypokalemia 03/13/2009   Hypernatremia 03/12/2009   Acute renal failure (HCC) 03/11/2009   Right hemiparesis (HCC) 01/29/2009   Thrombophilia (HCC) 01/29/2009   Acute blood loss anemia 10/23/2008   Vaginal bleeding, abnormal 10/22/2008   Hypercoagulable state (HCC) 09/23/2008   Personal history of transient ischemic attack (TIA) and cerebral infarction without residual deficit 07/30/2008     ONSET DATE: 03/09/23 (referral date)  REFERRING DIAG:  M25.511 (ICD-10-CM) - Acute pain of right shoulder  M89.8X5 (ICD-10-CM) - Pain in right femur   Per 03/09/23 referral notes: History of CVA- Right Side weakness  THERAPY DIAG:  Other symptoms and signs involving the nervous system  Other symptoms and signs involving the musculoskeletal system  Attention and concentration deficit  Frontal lobe and executive function deficit  Rationale for Evaluation and Treatment: Rehabilitation  SUBJECTIVE:   SUBJECTIVE STATEMENT: Pt's caregiver reported feeling overwhelmed. Following discussion with PT, pt's mother reported intent to call [employer] to discuss caregiving options/services and preference to pursue [facility] services for PT/OT. Pt's caregiver reported preference to continue OT today and 2 more visits next week "and go from there."   Pt accompanied by: self and mother  Okey Regal)  PERTINENT HISTORY: s/p CVA 2011, contracture of R hand and elbow, hx of falls, depression, hx of mild cognitive impairment, anemia, anxiety, GAD, hyperlipidemia, HTN, mirgraine headache, prediabetes.   Per 03/09/23 PA Progress Notes: mother who is her caretaker... she has a history of a right-sided CVA in 2011. Her mom is concerned as she seems to be falling more frequently of late... increased frequency and weakness in her right side.Marland KitchenMarland KitchenMom says she has difficulties and is unsteady with activity activities of daily living such as transferring to a commode where she has fallen before we will also give her referral to Ortho neurorehab occupational therapy   03/09/23 XR Shoulder Right Radiographs of her right shoulder no  evidence of dislocation the limited study second to patient's limitations.  No evidence of fracture no evidence of degenerative changes   03/09/23 XR Femur Right Radiographs of her right femur demonstrate no osseous lesions no fractures well-maintained alignment   02/15/23 DG Forearm  Right IMPRESSION: Limited views particularly of the proximal forearm. No obvious fracture or dislocation.   02/15/23 DG Elbow Complete Right:   FINDINGS: There is no evidence of fracture, dislocation, or joint effusion. There is no evidence of arthropathy or other focal bone abnormality. Soft tissues are unremarkable. Limited frontal views due to patient positioning.  PRECAUTIONS: Fall  WEIGHT BEARING RESTRICTIONS: No  PAIN:  Are you having pain? No, pt reported R shoulder "feeling alright."  FALLS: Has patient fallen in last 6 months? Yes. Number of falls "a lot," more than 5. Pt reported fall yesterday, denied injuries. Pt reported fall "every week." Pt's mother reported frequency of falls is increasing "1-3 times per week." Pt's mother reported typically calling 9-1-1 for assistance to help pt get up. Per chart review: significant fall in February 2024 on R side. X-ray negative.  LIVING ENVIRONMENT: Lives with: lives with their family (mom as primary caregiver, no caregiver aid since 2020), pt's mother reported working on finding a caregiver and enrolling pt in a day program though limited d/t episodes of incontinence Lives in: House/apartment Stairs: steps using railings Has following equipment at home: Quad cane large base, Shower bench, bed side commode, and Grab bars. Pt's mother reported working on getting bathroom remodeled.  PLOF:  Per 11/17/23 OP OT Evaluation: per mom report: bladder management, perineal hygiene, wash up left side, bath independent.   Per 11/27/22 OP OT Therapy Note:  Range of Motion: AROM  Right  Shoulder abduction/elevation in flexion synergic pattern 85   PROM  Right  shoulder flexion 105  Shoulder abduction 105  Elbow extension -30  Wrist flexion 45  Wrist extension 25  Finger in flexion contracture, passive open to functional C position. Thumb IP in hyperflexion   Strength: Dynamometer on level 2  L 38, 44, 35 avg: 39 lbs   Tone:  Hypertonicity- to elbow flexor, wrist flexor   PATIENT GOALS: Pt reported "hygiene." Pt's mother reported "doing some things for herself" to reduce caregiver burden.  OBJECTIVE:  Note: Objective measures were completed at Evaluation unless otherwise noted.  HAND DOMINANCE: Left since CVA several years ago (previously right handed)  ADLs: Overall ADLs:  Transfers/ambulation related to ADLs: Eating: ind Grooming: dependent for thoroughness UB Dressing: maxA, per pt's mother: "I think she could do more." LB Dressing: maxA, per pt's mother: "I think she could do more." Toileting: incontinent, unable to reach with BUE for wiping, "loose or soft stool. It's hard for her to clean herself." Bathing: max A, pt can wash some of R side using LUE Tub Shower transfers: close supervision Equipment: Metallurgist (pt does not use reacher)  IADLs: dependent  MOBILITY STATUS: Hx of falls and Pt in w/c borrowed from clinic during eval today. Pt brought quad cane.  Pt's mother reported pt sometimes attempts to stand up several times before completing sit-to-stand. Pt sometimes forgets cane around home and reverts to furniture-/wall-surfing leading to more instability and falls. Pt's mother reported pt averages falling 1-3 times per week and pt's mother has to call 9-1-1 for assistance. Pt's mother reported difficulty with helping pt with transfers.  POSTURE COMMENTS:  Ind, rounded shoulders, pt holding R arm in protected position in lap.  ACTIVITY TOLERANCE: Activity tolerance: decreased  Pt's mother reported pt sometimes waits for help rather than attempting tasks by pt's self.  FUNCTIONAL OUTCOME MEASURES: Modified Barthel Index: 5 points    UPPER EXTREMITY ROM:    Active ROM Right eval Left eval  Shoulder flexion  WFL  Shoulder abduction Approx. 75*   Shoulder adduction    Shoulder extension    Shoulder internal rotation    Shoulder external rotation    Elbow  flexion Unable AROM, maintained at 90*. PROM: WFL   Elbow extension Unable AROM, maintained at 90*. PROM: approx. -50*   Wrist flexion Impaired, maintained at neutral   Wrist extension Impaired, maintained at neutral   Wrist ulnar deviation    Wrist radial deviation    Wrist pronation    Wrist supination    (Blank rows = not tested)  Flexor synergy pattern RUE, strong flexion pattern of digits. OT noted difficulty to achieve PROM of digits d/t contracture. Pt's mother reported completing hand hygiene as able "I have to force the cloth" though confirms completion of hand hygiene.  HAND FUNCTION: Impaired RUE  COORDINATION: Impaired RUE  SENSATION: Unable to assess secondary to cognitive deficits  MUSCLE TONE: RUE: Rigidity and LUE: Within functional limits  COGNITION: Overall cognitive status: Impaired with hx of cognitive deficits and aphasia at baseline. Pt nods yes/no at times and benefits from extra time, repeated v/c, and therapist modeling to improve understanding. Pt demo'd some distractibility as evidenced by frequently looking towards noise/activity in environment.  VISION: not tested  VISION ASSESSMENT: not tested  PERCEPTION: Not tested  PRAXIS: Not tested  OBSERVATIONS: Pt wearing glasses. Pt arrived in w/c borrowed from clinic. Pt's mother assisted pt with w/c mobility. Pt carried quad cane while in w/c. Pt maintained R arm in protected position in lap and demo'd flexor synergy pattern of R arm. Pt's mother frequently expressed concerns about caregiver burden.                                                                                                                           TREATMENT DATE:  Self-Care OT recommended to pt and caregiver that [facility] OT may be more appropriate option following discussion with PT. Will continue OT POC for 2 additional visits to allow time to address safety and fit resting hand splint. Pt and caregiver agreeable. OT recommended to  caregiver to reach out to caregiver service resource provided at previous session or call number on back of insurance card for additional information about resources. Pt's caregiver verbalized understanding.   OT educated pt and caregiver on resting hand splint wear schedule recommendation at night. OT showed examples online. Pt's caregiver expressed interest in options, will discuss further at next OT visit. Pt agreeable to try wearing a resting hand splint every night.  Neuro Re-Ed Access Code: [email or web address] URL: [email or web address] Date: 04/06/2023 Prepared by: Carilyn Goodpasture  Pt benefited from v/c for seated upright posture and continuous cues for correct  BUE positioning during exercises. Pt's caregiver returned demo. Pt demo'd difficulty with counting reps. OT educated pt on importance of HEP compliance. Pt and caregiver verbalized understanding.  Exercises - Seated Shoulder Flexion Self PROM  - 1-2 x daily - 2 sets - 10 reps - Seated Cradle Shoulder Flexion and Horizontal Abduction PROM  - 1-2 x daily - 2 sets - 10 reps - Seated AAROM Elbow Flexion/Extension with Clasped Hands  - 1-2 x daily - 2 sets - 10 reps - Seated AAROM Wrist Flexion/Extension with Clasped Hands  - 1-2 x daily - 2 sets - 10 reps - Hand PROM Finger/digit Extension  - see pt instructions   PATIENT EDUCATION: Education details: see today's tx above  Person educated: Patient and mother Education method: Explanation, Verbal cues, and Handouts Education comprehension: verbalized understanding and needs further education  HOME EXERCISE PROGRAM: 04/06/23 - RUE ROM - Access Code: [email or web address]  GOALS: Goals reviewed with patient? Yes  SHORT TERM GOALS: Target date: 04/28/23  Patient and/or caregiver will demonstrate understanding updated RUE HEP with 25% verbal cues or less for proper execution and visual handouts. Baseline: previous HEP issued 11/24 OT POC 04/06/23 - updated RUE HEP issued Goal status: MET  2.   Pt and caregiver will verbalize understanding of resting hand splint options and don/doff preferred resting hand splint of RUE with caregiver assistance PRN. Baseline: no resting hand splint, RUE flexion contracture Goal status: INITIAL  3.  Pt and/or caregiver will verbalize understanding of splint wear/care schedule. Baseline: no resting hand splint, RUE flexion contracture Goal status: INITIAL  LONG TERM GOALS: Target date: 05/26/23  Pt and caregiver will return demo of hygiene adaptive strategies using A/E PRN for toileting and LUE hand hygiene. Baseline: Pt dependent for most ADLs/IADLs Goal status: INITIAL  2.  Pt and/or caregiver will verbalize or demo understanding of fall reduction strategies and strategies to complete safe transfers as needed for ADL toileting and showering. Baseline: pt demo's frequent falls Goal status: INITIAL  ASSESSMENT:  CLINICAL IMPRESSION: Pt required continuous cues to return demo of RUE HEP today. Pt's caregiver returned demo. OT educated pt on importance of HEP carryover. Pt's caregiver continues to feel overwhelmed at home and therefore pt would likely benefit from [facility] OT to improve carryover to home environment. Following discussion with pt and caregiver, outpt OT to continue for additional 1-2 visits to allow time to further discuss resting hand splint options and review safety at home. Likely then recommend transition to [facility] to improve carryover and address pt and caregiver's ongoing concerns.  PERFORMANCE DEFICITS: in functional skills including ADLs, IADLs, coordination, dexterity, proprioception, sensation, tone, ROM, strength, pain, flexibility, Fine motor control, Gross motor control, mobility, balance, body mechanics, endurance, continence, decreased knowledge of precautions, decreased knowledge of use of DME, and UE functional use, cognitive skills including attention, energy/drive, memory, problem solving, safety awareness, sequencing, thought,  and understand, and psychosocial skills including environmental adaptation, habits, and routines and behaviors.   IMPAIRMENTS: are limiting patient from ADLs, IADLs, leisure, and social participation.   CO-MORBIDITIES: has co-morbidities such as s/p CVA 2011, contracture of R hand and elbow, hx of falls, depression, hx of mild cognitive impairment, anemia, anxiety, GAD, hyperlipidemia, HTN, mirgraine headache, prediabetes  that affects occupational performance. Patient will benefit from skilled OT to address above impairments and improve overall function.  MODIFICATION OR ASSISTANCE TO COMPLETE EVALUATION: Maximum or significant modification of tasks or assist is necessary to complete an evaluation.  OT OCCUPATIONAL PROFILE AND  HISTORY: Detailed assessment: Review of records and additional review of physical, cognitive, psychosocial history related to current functional performance.  CLINICAL DECISION MAKING: High - multiple treatment options, significant modification of task necessary  REHAB POTENTIAL: Fair d/t chronicity of symptoms  EVALUATION COMPLEXITY: High    PLAN:  OT FREQUENCY: 1-2x/week  OT DURATION: 6 weeks  PLANNED INTERVENTIONS: 97168 OT Re-evaluation, 97535 self care/ADL training, 16109 therapeutic exercise, 97530 therapeutic activity, 97112 neuromuscular re-education, 97140 manual therapy, 97035 ultrasound, 97018 paraffin, 60454 fluidotherapy, 97010 moist heat, 97010 cryotherapy, 97032 electrical stimulation (manual), 97014 electrical stimulation unattended, 97102 mechanical traction, 97760 Orthotics management and training, 09811 Splinting (initial encounter), [id number] Subsequent splinting/medication, passive range of motion, balance training, functional mobility training, visual/perceptual remediation/compensation, energy conservation, patient/family education, and DME and/or AE instructions  RECOMMENDED OTHER SERVICES: PT eval recommended, w/c eval recommended. OT questioning  appropriateness of Outpt vs Home health therapy d/t frequent falls at home and pt's mother reporting difficulty with managing falls and expressing concerns about caregiver burden. OT to continue to monitor if HH vs outpt more appropriate setting.  CONSULTED AND AGREED WITH PLAN OF CARE: Patient and family member/caregiver  PLAN FOR NEXT SESSION:  Fabricate resting hand splint or discuss other options for RUE resting hand splint  Hand hygiene - pt complete with washcloth Review safe transfers D/C by end of next week - likely transition to [facility] recommendation per discussion with pt and caregiver on 04-29-23   For all possible CPT codes, reference the Planned Interventions line above.     Check all conditions that are expected to impact treatment: {Conditions expected to impact treatment:Cognitive Impairment or Intellectual disability and Contractures, spasticity or fracture relevant to requested treatment   If treatment provided at initial evaluation, no treatment charged due to lack of authorization.   Wynetta Emery, OT April 29, 2023, 2:44 PM

## 2023-04-06 NOTE — Therapy (Signed)
 OUTPATIENT PHYSICAL THERAPY NEURO EVALUATION   Patient Name: Stacey [name] MRN: [id number] DOB:Jul 08, 1985, 38 y.o., female Today's Date: 04/06/2023   PCP: Frederic Jericho, PA-C REFERRING PROVIDER: Persons, [place], [place]  END OF SESSION:  PT End of Session - 04/06/23 1237     Visit Number 1    Authorization Type [employer]    PT Start Time 1235    PT Stop Time 1318    PT Time Calculation (min) 43 min    Activity Tolerance Patient tolerated treatment well    Behavior During Therapy WFL for tasks assessed/performed             Past Medical History:  Diagnosis Date   Hypertension    Stroke [facility])    History reviewed. No pertinent surgical history. Patient Active Problem List   Diagnosis Date Noted   Depression 11/30/2021   Mild cognitive impairment 11/19/2021   Anemia due to stage 3a chronic kidney disease (HCC) 09/15/2021   Persistent proteinuria 09/15/2021   Anxiety 07/07/2021   GAD (generalized anxiety disorder) 04/13/2021   MDD (major depressive disorder), recurrent episode (HCC) 04/13/2021   CVA (cerebrovascular accident) (HCC) 04/24/2017   Genital herpes 04/24/2017   Hyperlipidemia, unspecified 04/24/2017   Hypertension 04/24/2017   Migraine headache 04/24/2017   Prediabetes 10/24/2014   Chronic kidney disease, stage II (mild) 10/09/2014   Absence of bladder continence 11/18/2013   Eczema 08/22/2012   Contraception 08/25/2010   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   Anemia 03/28/2009   Hypokalemia 03/13/2009   Hypernatremia 03/12/2009   Acute renal failure (HCC) 03/11/2009   Right hemiparesis (HCC) 01/29/2009   Thrombophilia (HCC) 01/29/2009   Acute blood loss anemia 10/23/2008   Vaginal bleeding, abnormal 10/22/2008   Hypercoagulable state (HCC) 09/23/2008   Personal history of transient ischemic attack (TIA) and cerebral infarction without residual deficit 07/30/2008    ONSET DATE: 03/27/2023 (referral)   REFERRING DIAG: M89.8X5  (ICD-10-CM) - Pain in right femur M25.511 (ICD-10-CM) - Acute pain of right shoulder  THERAPY DIAG:  Muscle weakness (generalized)  Other abnormalities of gait and mobility  Unsteadiness on feet  History of falling  Rationale for Evaluation and Treatment: Rehabilitation  SUBJECTIVE:                                                                                                                                                                                             SUBJECTIVE STATEMENT: Pt reports she falls "a lot". Unable to provide how many falls she has had, laughing when asked about falls. Mother reports pt frequently does not stand all the way up prior to  reaching for the cane or simply does not use the cane. Mother reports she is "physically exhausted" and needs support at home. Is in process of obtaining a caregiver, but only for 15 hours per week which is "nothing". Pt was attending a day program several years ago, but due to frequent incontinence which daycare staff could not handle, pt stopped going. Pt has tried a group home and ILF, but neither worked out. Mother would like pt to be more social but also would like more assistance w/caretaking.    Pt accompanied by:  Mother, Okey Regal   PERTINENT HISTORY: history of a right-sided CVA in 2011  PAIN:  Are you having pain? No  PRECAUTIONS: Fall  RED FLAGS: None   WEIGHT BEARING RESTRICTIONS: No  FALLS: Has patient fallen in last 6 months? Yes. Number of falls several per week   LIVING ENVIRONMENT: Lives with:  Mom, who is only caregiver Lives in: House/apartment Stairs:  steps w/railings Has following equipment at home: Quad cane large base, Shower bench, bed side commode, Grab bars, and per OT eval, mom working on remodeling house  PLOF: Needs assistance with ADLs, Needs assistance with homemaking, Needs assistance with gait, and Needs assistance with transfers  PATIENT GOALS: Per mom, "being more independent at  home"   OBJECTIVE:  Note: Objective measures were completed at Evaluation unless otherwise noted.  See OT eval (3/24) for measure                                                                                                                                TREATMENT :   Entirety of eval spent providing therapeutic listening to mother as she discusses pt's medical history and need for assistance at home. Obtained OT to discuss options w/pt and mother again (HHPT and HHOT) and at this time, pt more appropriate for home health therapy services. Reviewed resources that have been provided to family Tax inspector, [place]) and informed mother of process of setting up home health services. Mother in agreement with plan.  Informed pt that she needs to be using her cane at all times as she only falls when not using her cane and knows this. Pt verbalized understanding.   Mother reports pt's R ankle is rolling on her, so educated on potential to obtain AFO or air cast. Mother reports pt will not wear brace and this will add more hassle to her caregiver duties, so will not pursue at this time.   PATIENT EDUCATION: Education details: see above  Person educated: Patient and Parent Education method: Explanation Education comprehension: verbalized understanding   GOALS: N/A  ASSESSMENT:  CLINICAL IMPRESSION: Patient is a 38 year old female referred to Neuro OPPT for pain in R femur and shoulder. Pt's PMH is significant for: HTN, CVA. Due to caregiver burden and absence of carryover noted from OT in OP setting, pt is more appropriate for home health services at this time. Mother reports  her goal with therapy is for pt to be more independent in the home and is in agreement to pursue home health therapy once DC from outpatient OT. Pt would benefit from behavioral therapy and 24/7 supervision in the home as all of pt's falls are due to noncompliance w/use of AD. Along w/OT, provided mother w/extensive  resources to call to set up care giving assistance. Mother verbalized understanding and both mother and pt in agreement w/PT plan.    CLINICAL DECISION MAKING: Unstable/unpredictable  EVALUATION COMPLEXITY: High  PLAN:  PT FREQUENCY: one time visit   Jacobey Gura E Airis Barbee, PT, DPT 04/06/2023, 3:50 PM

## 2023-04-11 ENCOUNTER — Ambulatory Visit: Payer: MEDICAID | Admitting: Occupational Therapy

## 2023-04-11 DIAGNOSIS — R2681 Unsteadiness on feet: Secondary | ICD-10-CM

## 2023-04-11 DIAGNOSIS — R29818 Other symptoms and signs involving the nervous system: Secondary | ICD-10-CM

## 2023-04-11 DIAGNOSIS — R29898 Other symptoms and signs involving the musculoskeletal system: Secondary | ICD-10-CM

## 2023-04-11 DIAGNOSIS — R41840 Attention and concentration deficit: Secondary | ICD-10-CM

## 2023-04-11 DIAGNOSIS — M6281 Muscle weakness (generalized): Secondary | ICD-10-CM

## 2023-04-11 NOTE — Patient Instructions (Signed)
 WEARING SCHEDULE:  Build up tolerance gradually during the day until you can tolerate 4-5 consecutive hours with no problems. Then can try wearing at night. However, can wear palm protector at night initially and/or if cannot tolerate hard splint at night.   PURPOSE:  To improve positioning of hand  CARE OF SPLINT:  Keep splint away from heat sources including: stove, radiator or furnace, or a car in sunlight. The splint can melt and will no longer fit you properly  Clean splint with rubbing alcohol 1-2 times per day. Make sure hand is clean and dry before putting back on  Keep away from pets and children   PRECAUTIONS/POTENTIAL PROBLEMS: *If you notice or experience increased pain, swelling, numbness, or a lingering reddened area from the splint: take off splint and  contact your therapist by calling [phone] or bring with you to next appointment for adjustments.

## 2023-04-11 NOTE — Therapy (Signed)
 OUTPATIENT OCCUPATIONAL THERAPY NEURO Treatment  Patient Name: Stacey [name] MRN: [id number] DOB:22-Nov-1985, 38 y.o., female Today's Date: 04/11/2023  PCP: Frederic Jericho, PA-C  REFERRING PROVIDER: Persons, [place], [place]  END OF SESSION:  OT End of Session - 04/11/23 1238     Visit Number 4    Number of Visits 13   including eval   Date for OT Re-Evaluation 05/26/23    Authorization Type [employer]    OT Start Time 1236    OT Stop Time 1315    OT Time Calculation (min) 39 min    Activity Tolerance Patient tolerated treatment well    Behavior During Therapy WFL for tasks assessed/performed              Past Medical History:  Diagnosis Date   Hypertension    Stroke (HCC)    No past surgical history on file. Patient Active Problem List   Diagnosis Date Noted   Depression 11/30/2021   Mild cognitive impairment 11/19/2021   Anemia due to stage 3a chronic kidney disease (HCC) 09/15/2021   Persistent proteinuria 09/15/2021   Anxiety 07/07/2021   GAD (generalized anxiety disorder) 04/13/2021   MDD (major depressive disorder), recurrent episode (HCC) 04/13/2021   CVA (cerebrovascular accident) (HCC) 04/24/2017   Genital herpes 04/24/2017   Hyperlipidemia, unspecified 04/24/2017   Hypertension 04/24/2017   Migraine headache 04/24/2017   Prediabetes 10/24/2014   Chronic kidney disease, stage II (mild) 10/09/2014   Absence of bladder continence 11/18/2013   Eczema 08/22/2012   Contraception 08/25/2010   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   Anemia 03/28/2009   Hypokalemia 03/13/2009   Hypernatremia 03/12/2009   Acute renal failure (HCC) 03/11/2009   Right hemiparesis (HCC) 01/29/2009   Thrombophilia (HCC) 01/29/2009   Acute blood loss anemia 10/23/2008   Vaginal bleeding, abnormal 10/22/2008   Hypercoagulable state (HCC) 09/23/2008   Personal history of transient ischemic attack (TIA) and cerebral infarction without residual deficit 07/30/2008     ONSET DATE: 03/09/23 (referral date)  REFERRING DIAG:  M25.511 (ICD-10-CM) - Acute pain of right shoulder  M89.8X5 (ICD-10-CM) - Pain in right femur   Per 03/09/23 referral notes: History of CVA- Right Side weakness  THERAPY DIAG:  Muscle weakness (generalized)  Unsteadiness on feet  Other symptoms and signs involving the nervous system  Other symptoms and signs involving the musculoskeletal system  Attention and concentration deficit  Rationale for Evaluation and Treatment: Rehabilitation  SUBJECTIVE:   SUBJECTIVE STATEMENT: Pt/caregiver reports recent fall getting up from the commode  Pt accompanied by: self and mother  Okey Regal)  PERTINENT HISTORY: s/p CVA 2011, contracture of R hand and elbow, hx of falls, depression, hx of mild cognitive impairment, anemia, anxiety, GAD, hyperlipidemia, HTN, mirgraine headache, prediabetes.   Per 03/09/23 PA Progress Notes: mother who is her caretaker... she has a history of a right-sided CVA in 2011. Her mom is concerned as she seems to be falling more frequently of late... increased frequency and weakness in her right side.Marland KitchenMarland KitchenMom says she has difficulties and is unsteady with activity activities of daily living such as transferring to a commode where she has fallen before we will also give her referral to [facility] neurorehab occupational therapy   03/09/23 XR Shoulder Right Radiographs of her right shoulder no evidence of dislocation the limited study second to patient's limitations.  No evidence of fracture no evidence of degenerative changes   03/09/23 XR Femur Right Radiographs of her right femur demonstrate no osseous lesions no fractures  well-maintained alignment   02/15/23 DG Forearm Right IMPRESSION: Limited views particularly of the proximal forearm. No obvious fracture or dislocation.   02/15/23 DG Elbow Complete Right:   FINDINGS: There is no evidence of fracture, dislocation, or joint effusion. There is no evidence of arthropathy  or other focal bone abnormality. Soft tissues are unremarkable. Limited frontal views due to patient positioning.  PRECAUTIONS: Fall  WEIGHT BEARING RESTRICTIONS: No  PAIN:  Are you having pain? No, pt reported R shoulder "feeling alright."  FALLS: Has patient fallen in last 6 months? Yes. Number of falls "a lot," more than 5. Pt reported fall yesterday, denied injuries. Pt reported fall "every week." Pt's mother reported frequency of falls is increasing "1-3 times per week." Pt's mother reported typically calling 9-1-1 for assistance to help pt get up. Per chart review: significant fall in February 2024 on R side. X-ray negative.  LIVING ENVIRONMENT: Lives with: lives with their family (mom as primary caregiver, no caregiver aid since 2020), pt's mother reported working on finding a caregiver and enrolling pt in a day program though limited d/t episodes of incontinence Lives in: House/apartment Stairs: steps using railings Has following equipment at home: Quad cane large base, Shower bench, bed side commode, and Grab bars. Pt's mother reported working on getting bathroom remodeled.  PLOF:  Per 11/17/23 OP OT Evaluation: per mom report: bladder management, perineal hygiene, wash up left side, bath independent.   Per 11/27/22 OP OT Therapy Note:  Range of Motion: AROM  Right  Shoulder abduction/elevation in flexion synergic pattern 85   PROM  Right  shoulder flexion 105  Shoulder abduction 105  Elbow extension -30  Wrist flexion 45  Wrist extension 25  Finger in flexion contracture, passive open to functional C position. Thumb IP in hyperflexion   Strength: Dynamometer on level 2  L 38, 44, 35 avg: 39 lbs   Tone: Hypertonicity- to elbow flexor, wrist flexor   PATIENT GOALS: Pt reported "hygiene." Pt's mother reported "doing some things for herself" to reduce caregiver burden.  OBJECTIVE:  Note: Objective measures were completed at Evaluation unless otherwise noted.  HAND  DOMINANCE: Left since CVA several years ago (previously right handed)  ADLs: Overall ADLs:  Transfers/ambulation related to ADLs: Eating: ind Grooming: dependent for thoroughness UB Dressing: maxA, per pt's mother: "I think she could do more." LB Dressing: maxA, per pt's mother: "I think she could do more." Toileting: incontinent, unable to reach with BUE for wiping, "loose or soft stool. It's hard for her to clean herself." Bathing: max A, pt can wash some of R side using LUE Tub Shower transfers: close supervision Equipment: Metallurgist (pt does not use reacher)  IADLs: dependent  MOBILITY STATUS: Hx of falls and Pt in w/c borrowed from clinic during eval today. Pt brought quad cane.  Pt's mother reported pt sometimes attempts to stand up several times before completing sit-to-stand. Pt sometimes forgets cane around home and reverts to furniture-/wall-surfing leading to more instability and falls. Pt's mother reported pt averages falling 1-3 times per week and pt's mother has to call 9-1-1 for assistance. Pt's mother reported difficulty with helping pt with transfers.  POSTURE COMMENTS:  Ind, rounded shoulders, pt holding R arm in protected position in lap.  ACTIVITY TOLERANCE: Activity tolerance: decreased  Pt's mother reported pt sometimes waits for help rather than attempting tasks by pt's self.  FUNCTIONAL OUTCOME MEASURES: Modified Barthel Index: 5 points    UPPER EXTREMITY ROM:  Active ROM Right eval Left eval  Shoulder flexion  WFL  Shoulder abduction Approx. 75*   Shoulder adduction    Shoulder extension    Shoulder internal rotation    Shoulder external rotation    Elbow flexion Unable AROM, maintained at 90*. PROM: WFL   Elbow extension Unable AROM, maintained at 90*. PROM: approx. -50*   Wrist flexion Impaired, maintained at neutral   Wrist extension Impaired, maintained at neutral   Wrist ulnar deviation    Wrist radial deviation     Wrist pronation    Wrist supination    (Blank rows = not tested)  Flexor synergy pattern RUE, strong flexion pattern of digits. OT noted difficulty to achieve PROM of digits d/t contracture. Pt's mother reported completing hand hygiene as able "I have to force the cloth" though confirms completion of hand hygiene.  HAND FUNCTION: Impaired RUE  COORDINATION: Impaired RUE  SENSATION: Unable to assess secondary to cognitive deficits  MUSCLE TONE: RUE: Rigidity and LUE: Within functional limits  COGNITION: Overall cognitive status: Impaired with hx of cognitive deficits and aphasia at baseline. Pt nods yes/no at times and benefits from extra time, repeated v/c, and therapist modeling to improve understanding. Pt demo'd some distractibility as evidenced by frequently looking towards noise/activity in environment.  VISION: not tested  VISION ASSESSMENT: not tested  PERCEPTION: Not tested  PRAXIS: Not tested  OBSERVATIONS: Pt wearing glasses. Pt arrived in w/c borrowed from clinic. Pt's mother assisted pt with w/c mobility. Pt carried quad cane while in w/c. Pt maintained R arm in protected position in lap and demo'd flexor synergy pattern of R arm. Pt's mother frequently expressed concerns about caregiver burden.                                                                                                                           TREATMENT DATE:  Pt issued palm protector to open fingers away from palm for better hygiene care and palm protection.   Fabricated and fitted resting hand splint and issued. Reviewed wear and care with pt/mother. Wearing schedule may eventually be tolerated at night, however it pt unable to tolerate at night, instructed to wear during the day and wear palm protector at night  PATIENT EDUCATION: Education details: see today's tx above  Person educated: Patient and mother Education method: Explanation, Verbal cues, and Handouts Education comprehension:  verbalized understanding and needs further education  HOME EXERCISE PROGRAM: 04/06/23 - RUE ROM - Access Code: [email or web address] 04/11/23: splint wear and care  GOALS: Goals reviewed with patient? Yes  SHORT TERM GOALS: Target date: 04/28/23  Patient and/or caregiver will demonstrate understanding updated RUE HEP with 25% verbal cues or less for proper execution and visual handouts. Baseline: previous HEP issued 11/24 OT POC 04/06/23 - updated RUE HEP issued Goal status: MET  2.  Pt and caregiver will verbalize understanding of resting hand splint options and don/doff preferred resting hand splint of RUE with caregiver assistance  PRN. Baseline: no resting hand splint, RUE flexion contracture Goal status: IN PROGRESS   3.  Pt and/or caregiver will verbalize understanding of splint wear/care schedule. Baseline: no resting hand splint, RUE flexion contracture Goal status: IN PROGRESS  LONG TERM GOALS: Target date: 05/26/23  Pt and caregiver will return demo of hygiene adaptive strategies using A/E PRN for toileting and LUE hand hygiene. Baseline: Pt dependent for most ADLs/IADLs Goal status: INITIAL  2.  Pt and/or caregiver will verbalize or demo understanding of fall reduction strategies and strategies to complete safe transfers as needed for ADL toileting and showering. Baseline: pt demo's frequent falls Goal status: INITIAL  ASSESSMENT:  CLINICAL IMPRESSION: Pt progressing towards STG's. Pt's caregiver continues to feel overwhelmed at home and therefore pt would likely benefit from [facility] OT to improve carryover to home environment. Recommend transition to [facility] to improve carryover and address pt and caregiver's ongoing concerns after next visit.  PERFORMANCE DEFICITS: in functional skills including ADLs, IADLs, coordination, dexterity, proprioception, sensation, tone, ROM, strength, pain, flexibility, Fine motor control, Gross motor control, mobility, balance, body mechanics, endurance, continence,  decreased knowledge of precautions, decreased knowledge of use of DME, and UE functional use, cognitive skills including attention, energy/drive, memory, problem solving, safety awareness, sequencing, thought, and understand, and psychosocial skills including environmental adaptation, habits, and routines and behaviors.   IMPAIRMENTS: are limiting patient from ADLs, IADLs, leisure, and social participation.   CO-MORBIDITIES: has co-morbidities such as s/p CVA 2011, contracture of R hand and elbow, hx of falls, depression, hx of mild cognitive impairment, anemia, anxiety, GAD, hyperlipidemia, HTN, mirgraine headache, prediabetes  that affects occupational performance. Patient will benefit from skilled OT to address above impairments and improve overall function.  MODIFICATION OR ASSISTANCE TO COMPLETE EVALUATION: Maximum or significant modification of tasks or assist is necessary to complete an evaluation.  OT OCCUPATIONAL PROFILE AND HISTORY: Detailed assessment: Review of records and additional review of physical, cognitive, psychosocial history related to current functional performance.  CLINICAL DECISION MAKING: High - multiple treatment options, significant modification of task necessary  REHAB POTENTIAL: Fair d/t chronicity of symptoms  EVALUATION COMPLEXITY: High    PLAN:  OT FREQUENCY: 1-2x/week  OT DURATION: 6 weeks  PLANNED INTERVENTIONS: 97168 OT Re-evaluation, 97535 self care/ADL training, 56213 therapeutic exercise, 97530 therapeutic activity, 97112 neuromuscular re-education, 97140 manual therapy, 97035 ultrasound, 97018 paraffin, 08657 fluidotherapy, 97010 moist heat, 97010 cryotherapy, 97032 electrical stimulation (manual), 97014 electrical stimulation unattended, 97102 mechanical traction, 97760 Orthotics management and training, 84696 Splinting (initial encounter), [id number] Subsequent splinting/medication, passive range of motion, balance training, functional mobility training,  visual/perceptual remediation/compensation, energy conservation, patient/family education, and DME and/or AE instructions  RECOMMENDED OTHER SERVICES: PT eval recommended, w/c eval recommended. OT questioning appropriateness of Outpt vs Home health therapy d/t frequent falls at home and pt's mother reporting difficulty with managing falls and expressing concerns about caregiver burden. OT to continue to monitor if HH vs outpt more appropriate setting.  CONSULTED AND AGREED WITH PLAN OF CARE: Patient and family member/caregiver  PLAN FOR NEXT SESSION:  Adjust resting hand splint prn, address remaining goals as able and d/c next session with likely transition to home health Review safe transfers   For all possible CPT codes, reference the Planned Interventions line above.     Check all conditions that are expected to impact treatment: {Conditions expected to impact treatment:Cognitive Impairment or Intellectual disability and Contractures, spasticity or fracture relevant to requested treatment   If treatment provided at initial evaluation, no  treatment charged due to lack of authorization.   Sheran Lawless, OT 04/11/2023, 12:38 PM

## 2023-04-14 ENCOUNTER — Emergency Department (HOSPITAL_COMMUNITY): Payer: MEDICAID

## 2023-04-14 ENCOUNTER — Emergency Department (HOSPITAL_COMMUNITY)
Admission: EM | Admit: 2023-04-14 | Discharge: 2023-04-15 | Disposition: A | Discharge status: Home / Self Care | Payer: MEDICAID | Attending: Emergency Medicine | Admitting: Emergency Medicine

## 2023-04-14 ENCOUNTER — Ambulatory Visit: Payer: MEDICAID | Admitting: Occupational Therapy

## 2023-04-14 DIAGNOSIS — I10 Essential (primary) hypertension: Secondary | ICD-10-CM | POA: Diagnosis not present

## 2023-04-14 DIAGNOSIS — Z8616 Personal history of COVID-19: Secondary | ICD-10-CM | POA: Insufficient documentation

## 2023-04-14 DIAGNOSIS — R4789 Other speech disturbances: Secondary | ICD-10-CM | POA: Diagnosis not present

## 2023-04-14 DIAGNOSIS — R4781 Slurred speech: Secondary | ICD-10-CM | POA: Insufficient documentation

## 2023-04-14 DIAGNOSIS — E11649 Type 2 diabetes mellitus with hypoglycemia without coma: Secondary | ICD-10-CM

## 2023-04-14 DIAGNOSIS — R410 Disorientation, unspecified: Secondary | ICD-10-CM

## 2023-04-14 DIAGNOSIS — E162 Hypoglycemia, unspecified: Secondary | ICD-10-CM | POA: Insufficient documentation

## 2023-04-14 LAB — CBC
HCT: 36.3 % (ref 36.0–46.0)
Hemoglobin: 11.4 g/dL — ABNORMAL LOW (ref 12.0–15.0)
MCH: 27 pg (ref 26.0–34.0)
MCHC: 31.4 g/dL (ref 30.0–36.0)
MCV: 86 fL (ref 80.0–100.0)
Platelets: 288 10*3/uL (ref 150–400)
RBC: 4.22 MIL/uL (ref 3.87–5.11)
RDW: 16.6 % — ABNORMAL HIGH (ref 11.5–15.5)
WBC: 12.4 10*3/uL — ABNORMAL HIGH (ref 4.0–10.5)
nRBC: 0 % (ref 0.0–0.2)

## 2023-04-14 LAB — I-STAT CHEM 8, ED
BUN: 27 mg/dL — ABNORMAL HIGH (ref 6–20)
Calcium, Ion: 1.16 mmol/L (ref 1.15–1.40)
Chloride: 111 mmol/L (ref 98–111)
Creatinine, Ser: 2 mg/dL — ABNORMAL HIGH (ref 0.44–1.00)
Glucose, Bld: 85 mg/dL (ref 70–99)
HCT: 37 % (ref 36.0–46.0)
Hemoglobin: 12.6 g/dL (ref 12.0–15.0)
Potassium: 4.6 mmol/L (ref 3.5–5.1)
Sodium: 140 mmol/L (ref 135–145)
TCO2: 19 mmol/L — ABNORMAL LOW (ref 22–32)

## 2023-04-14 LAB — DIFFERENTIAL
Abs Immature Granulocytes: 0.06 10*3/uL (ref 0.00–0.07)
Basophils Absolute: 0 10*3/uL (ref 0.0–0.1)
Basophils Relative: 0 %
Eosinophils Absolute: 0.3 10*3/uL (ref 0.0–0.5)
Eosinophils Relative: 2 %
Immature Granulocytes: 1 %
Lymphocytes Relative: 46 %
Lymphs Abs: 5.7 10*3/uL — ABNORMAL HIGH (ref 0.7–4.0)
Monocytes Absolute: 0.8 10*3/uL (ref 0.1–1.0)
Monocytes Relative: 6 %
Neutro Abs: 5.6 10*3/uL (ref 1.7–7.7)
Neutrophils Relative %: 45 %

## 2023-04-14 LAB — PROTIME-INR
INR: 1.1 (ref 0.8–1.2)
Prothrombin Time: 13.9 s (ref 11.4–15.2)

## 2023-04-14 LAB — CBG MONITORING, ED: Glucose-Capillary: 53 mg/dL — ABNORMAL LOW (ref 70–99)

## 2023-04-14 LAB — APTT: aPTT: 28 s (ref 24–36)

## 2023-04-14 MED ORDER — SODIUM CHLORIDE 0.9% FLUSH: 3.0000 mL | Freq: Once | INTRAVENOUS | Status: DC

## 2023-04-14 NOTE — Consult Note (Addendum)
 NEUROLOGY CONSULT NOTE   Date of service: April 14, 2023 Patient Name: Stacey [name] MRN:  [id number] DOB:  12/22/1985 Chief Complaint: "confusion, word finding difficulty" Requesting Provider: Maia Plan, MD  History of Present Illness  Stacey [name] is a 38 y.o. 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  Per EMS, was fine when family left at 2000 and confused and word finding difficulty when family returned. EMS called and glucose was 95. She was brought in as a code stroke for slurred speech, word finding difficulty. Glucose on arrival was 53. She was a hard stick and given oral glucose gel.  CT Head w/o contrast was negative for any acute intracranial abnormalities. She was taken for STAT MRI Brain which was negative for acute stroke  Of note, she was on warfarin in the past for AT3, Protein C and S deficiency. However, it does not seem like she is on it. Unclear if she was taken off due to falls?. EMS also reported that patient ran out of her plavix.  LKW: 2000 Modified rankin score: 4-Needs assistance to walk and tend to bodily needs IV Thrombolysis: not offered, large part of deficit felt consistent with her prior stroke and felt to have some recrudescence in the setting of hypoglycemia. EVT: not offered, low suspicion for LVO.  NIHSS components Score: Comment  1a Level of Conscious 0[x]  1[]  2[]  3[]      1b LOC Questions 0[]  1[]  2[x]       1c LOC Commands 0[]  1[x]  2[]       2 Best Gaze 0[x]  1[]  2[]       3 Visual 0[x]  1[]  2[]  3[]      4 Facial Palsy 0[x]  1[]  2[]  3[]      5a Motor Arm - left 0[x]  1[]  2[]  3[]  4[]  UN[]    5b Motor Arm - Right 0[]  1[]  2[]  3[x]  4[]  UN[]    6a Motor Leg - Left 0[x]  1[]  2[]  3[]  4[]  UN[]    6b Motor  Leg - Right 0[]  1[]  2[]  3[x]  4[]  UN[]    7 Limb Ataxia 0[x]  1[]  2[]  3[]  UN[]     8 Sensory 0[]  1[x]  2[]  UN[]      9 Best Language 0[]  1[]  2[x]  3[]      10 Dysarthria 0[]  1[x]  2[]  UN[]      11 Extinct. and Inattention 0[x]  1[]  2[]       TOTAL: 13      ROS  Unable to ascertain due to aphasia, confusion.  Past History   Past Medical History:  Diagnosis Date   Hypertension    Stroke [facility])     No past surgical history on file.  Family History: No family history on file.  Social History  reports that she has never smoked. She has never used smokeless tobacco. She reports that she does not drink alcohol and does not use drugs.  Allergies  Allergen Reactions   Metformin Diarrhea and Other (See Comments)    Medications   Current Facility-Administered Medications:    sodium chloride flush (NS) 0.9 % injection 3 mL, 3 mL, Intravenous, Once, Long, Arlyss Repress, MD  Current Outpatient Medications:    ARIPiprazole (ABILIFY) 10 MG tablet, Take 1 tablet (10 mg total) by mouth daily., Disp: 90 tablet, Rfl: 0  Ascorbic Acid 500 MG/5ML LIQD, Take by mouth. (Patient not taking: Reported on 03/27/2023), Disp: , Rfl:    Ascorbic Acid 500 MG/5ML SYRP, Take by mouth.  (Patient not taking: Reported on 03/27/2023), Disp: , Rfl:    aspirin 325 MG EC tablet, Take by mouth., Disp: , Rfl:    aspirin 325 MG tablet, Take 325 mg by mouth daily.  (Patient not taking: Reported on 03/27/2023), Disp: , Rfl:    atorvastatin (LIPITOR) 80 MG tablet, Take by mouth., Disp: , Rfl:    atorvastatin (LIPITOR) 80 MG tablet, Take by mouth. (Patient not taking: Reported on 03/27/2023), Disp: , Rfl:    baclofen (LIORESAL) 10 MG tablet, Take 10 mg by mouth 3 (three) times daily. , Disp: , Rfl:    benzonatate (TESSALON) 200 MG capsule, TAKE ONE CAPSULE BY MOUTH THREE TIMES A DAY FOR UP TO 7 DAYS (Patient not taking: Reported on 03/27/2023), Disp: , Rfl:    Blood Glucose Monitoring Suppl (FIFTY50 GLUCOSE METER 2.0) w/Device KIT, See  admin instructions. (Patient not taking: Reported on 03/27/2023), Disp: , Rfl:    calcium-vitamin D (OSCAL WITH D) 500-200 MG-UNIT TABS tablet, Take by mouth.  (Patient not taking: Reported on 03/27/2023), Disp: , Rfl:    clopidogrel (PLAVIX) 75 MG tablet, Take 75 mg by mouth daily. Patient taking ASA instead, Disp: , Rfl:    Cysteamine Bitartrate (PROCYSBI) 300 MG PACK, Use 1 each as directed. (Patient not taking: Reported on 03/27/2023), Disp: , Rfl:    Docusate Sodium (DSS) 100 MG CAPS, Take by mouth.  (Patient not taking: Reported on 03/27/2023), Disp: , Rfl:    esomeprazole (NEXIUM) 40 MG capsule, Take by mouth.  (Patient not taking: Reported on 03/27/2023), Disp: , Rfl:    ferrous sulfate 300 (60 Fe) MG/5ML syrup, Take by mouth.  (Patient not taking: Reported on 03/27/2023), Disp: , Rfl:    fluticasone (FLONASE) 50 MCG/ACT nasal spray, Place into the nose., Disp: , Rfl:    glucose blood (PRECISION QID TEST) test strip, Use 1 strip as directed. (Patient not taking: Reported on 03/27/2023), Disp: , Rfl:    Incontinence Supply Disposable (UNDERPADS) MISC, 1 CASE + 5 PACKS PEACH!, Disp: , Rfl:    ketoconazole (NIZORAL) 2 % cream, Apply topically daily. (Patient not taking: Reported on 03/27/2023), Disp: , Rfl:    medroxyPROGESTERone (DEPO-PROVERA) 150 MG/ML injection, Inject into the muscle. , Disp: , Rfl:    medroxyPROGESTERone Acetate 150 MG/ML SUSY, Inject into the muscle.  (Patient not taking: Reported on 03/27/2023), Disp: , Rfl:    medroxyPROGESTERone Acetate 150 MG/ML SUSY, Inject as directed See admin instructions. (Patient not taking: Reported on 03/27/2023), Disp: , Rfl:    metoprolol tartrate (LOPRESSOR) 25 MG tablet, Take by mouth., Disp: , Rfl:    nitrofurantoin, macrocrystal-monohydrate, (MACROBID) 100 MG capsule, Take 100 mg by mouth every 12 (twelve) hours. (Patient not taking: Reported on 03/27/2023), Disp: , Rfl:    ondansetron (ZOFRAN-ODT) 8 MG disintegrating tablet, 8mg  ODT q4 hours prn  nausea (Patient not taking: Reported on 03/27/2023), Disp: 10 tablet, Rfl: 0   ramipril (ALTACE) 5 MG capsule, Take 5 mg by mouth daily. , Disp: , Rfl:    sertraline (ZOLOFT) 50 MG tablet, Take 3 tablets (150 mg total) by mouth daily., Disp: 270 tablet, Rfl: 0   Skin Protectants, Misc. (DERMACERIN) CREA, Apply topically.  (Patient not taking: Reported on 03/27/2023), Disp: , Rfl:    valACYclovir (VALTREX) 500 MG tablet, 1 tab by mouth three  times daily for 5 days as needed (Patient not taking: Reported on 03/27/2023), Disp: , Rfl:    Vitamin D, Ergocalciferol, (DRISDOL) 1.25 MG (50000 UNIT) CAPS capsule, Take 50,000 Units by mouth once a week., Disp: , Rfl:   Vitals   Vitals:   04/18/23 2300  Weight: 84.6 kg  Height: 5\' 7"  (1.702 m)    Body mass index is 29.21 kg/m.  Physical Exam   General: Laying comfortably in bed; in no acute distress.  HENT: Normal oropharynx and mucosa. Normal external appearance of ears and nose.  Neck: Supple, no pain or tenderness  CV: No JVD. No peripheral edema.  Pulmonary: Symmetric Chest rise. Normal respiratory effort.  Abdomen: Soft to touch, non-tender. Ext: No cyanosis, edema, R arm contracted. Skin: No rash. Normal palpation of skin.   Musculoskeletal: Normal digits and nails by inspection. No clubbing.  Neurologic Examination  Mental status/Cognition: Alert, oriented to self only, reports she is 23. Does not know the month and year but this could possibly be due to aphasia. good attention. Speech/language: non fluent, comprehension intact intermittently to simple commands, names only some out of many objects. Cranial nerves:   CN II Pupils equal and reactive to light, no VF deficits    CN III,IV,VI EOM intact, no gaze preference or deviation, no nystagmus    CN V normal sensation in V1, V2, and V3 segments bilaterally    CN VII no asymmetry, no nasolabial fold flattening    CN VIII normal hearing to speech    CN IX & X normal palatal elevation,  no uvular deviation    CN XI 5/5 head turn and 5/5 shoulder shrug bilaterally    CN XII midline tongue protrusion    Motor:  Muscle bulk: normal, tone contracture on the right. Mvmt Root Nerve  Muscle Right Left Comments  SA C5/6 Ax Deltoid 0 5   EF C5/6 Mc Biceps 0 5   EE C6/7/8 Rad Triceps 0 5   WF C6/7 Med FCR     WE C7/8 PIN ECU     F Ab C8/T1 U ADM/FDI 1 5   HF L1/2/3 Fem Illopsoas 1 5   KE L2/3/4 Fem Quad     DF L4/5 D Peron Tib Ant 1 5   PF S1/2 Tibial Grc/Sol 1 5    Reflexes:  Right Left Comments  Pectoralis      Biceps (C5/6)     Brachioradialis (C5/6)      Triceps (C6/7)      Patellar (L3/4)      Achilles (S1)      Hoffman      Plantar     Jaw jerk    Sensation:  Light touch Intact throughout   Pin prick    Temperature    Vibration   Proprioception    Coordination/Complex Motor:  - Finger to Nose intact on the left   Labs/Imaging/Neurodiagnostic studies   CBC:  Recent Labs  Lab April 18, 2023 2330 2023-04-18 2338  WBC 12.4*  --   NEUTROABS 5.6  --   HGB 11.4* 12.6  HCT 36.3 37.0  MCV 86.0  --   PLT 288  --    Basic Metabolic Panel:  Lab Results  Component Value Date   NA 140 Apr 18, 2023   K 4.6 Apr 18, 2023   CO2 16 (L) 07/04/2022   GLUCOSE 85 Apr 18, 2023   BUN 27 (H) 2023/04/18   CREATININE 2.00 (H) 04/18/23   CALCIUM 8.3 (L) 07/04/2022   GFRNONAA  32 (L) 07/04/2022   GFRAA >60 11/10/2011   Lipid Panel: No results found for: "LDLCALC" HgbA1c: No results found for: "HGBA1C" Urine Drug Screen: No results found for: "LABOPIA", "COCAINSCRNUR", "LABBENZ", "AMPHETMU", "THCU", "LABBARB"  Alcohol Level No results found for: "ETH" INR No results found for: "INR" APTT No results found for: "APTT" AED levels: No results found for: "PHENYTOIN", "ZONISAMIDE", "LAMOTRIGINE", "LEVETIRACETA"  CT Head without contrast(Personally reviewed): 1. Stable head CT.  No acute intracranial abnormality. 2. ASPECTS is 10. 3. Multiple remote infarcts chronic ischemic  changes with multiple remote infarcts involving the left greater than right cerebral hemispheres.  MRI Brain(Personally reviewed): No acute stroke  ASSESSMENT   Stacey [name] is a 38 y.o. 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 She was hypoglycemic on arrival with glucose of 53.  She was taken to STAT MRI to help differentiate between new stroke vs recrudescence of prior stroke symptoms in the setting of hypoglycemia. MRI Brain negative for acute stroke, speech improved after administration of glucose gel.  Low suspicion for seizure, no clinical seizure activity noted, does not appear post ictal.  RECOMMENDATIONS  - no further inpatient neurologic workup. Okay for discharge from a neuro standpoint. ______________________________________________________________________  Plan discussed with Dr. Dolan Freiberg with the ED team.  Signed, Luv Mish, MD [facility]

## 2023-04-14 NOTE — Code Documentation (Signed)
 Stroke Response Nurse Documentation Code Documentation  FLETA BORGESON is a 38 y.o. female arriving to [facility]  via [facility] on 4/11 with past medical hx of prior stroke, HTN, HLD. On No antithrombotic. Code stroke was activated by EMS.   Patient from home where she was LKW at 2000 and now complaining of aphasia and confusion .   Stroke team at the bedside on patient arrival. Labs drawn and patient cleared for CT by Dr. Dolan Freiberg. Patient to CT with team. NIHSS 13, see documentation for details and code stroke times. Patient with disoriented, not following commands, right arm weakness, right leg weakness, right decreased sensation, Global aphasia , and dysarthria  on exam. The following imaging was completed:  CT Head and MRI. Patient is not a candidate for IV Thrombolytic due to MRI negative for new stroke. Patient is not a candidate for IR due to MRI negative for new stroke.   Care Plan: Cancel code stroke.     Bedside handoff with ED RN Toy Freund .    Dodson Freestone  Rapid Response RN

## 2023-04-14 NOTE — Consult Note (Incomplete)
 NEUROLOGY CONSULT NOTE   Date of service: April 14, 2023 Patient Name: Stacey [name] MRN:  [id number] DOB:  May 02, 1985 Chief Complaint: "confusion, word finding difficulty" Requesting Provider: Maia Plan, MD  History of Present Illness  Stacey [name] is a 38 y.o. 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  Per EMS, was fine when family left at 2000 and confused and word finding difficulty when family returned. EMS called and glucose was 95. She was brought in as a code stroke for slurred speech, word finding difficulty. Glucose on arrival was 53. She was a hard stick and given oral glucose gel.  CT Head w/o contrast was negative for any acute intracranial abnormalities. She was taken for STAT MRI Brain which was ***  Of note, she was on warfarin in the past for AT3, Protein C and S deficiency. However, it does not seem like she is on it. Unclear if she was taken off due to falls?. EMS also reported that patient ran out of her plavix.  LKW: 2000 Modified rankin score: 4-Needs assistance to walk and tend to bodily needs IV Thrombolysis: not offered, large part of deficit felt consistent with her prior stroke and felt to have some recrudescence in the setting of hypoglycemia. EVT: ***Yes, *** No (reason)  NIHSS components Score: Comment  1a Level of Conscious 0[x]  1[]  2[]  3[]      1b LOC Questions 0[]  1[]  2[x]       1c LOC Commands 0[]  1[x]  2[]       2 Best Gaze 0[x]  1[]  2[]       3 Visual 0[x]  1[]  2[]  3[]      4 Facial Palsy 0[x]  1[]  2[]  3[]      5a Motor Arm - left 0[x]  1[]  2[]  3[]  4[]  UN[]    5b Motor Arm - Right 0[]  1[]  2[]  3[x]  4[]  UN[]    6a Motor Leg - Left 0[x]  1[]  2[]  3[]  4[]  UN[]    6b Motor Leg - Right 0[]  1[]  2[]  3[x]  4[]   UN[]    7 Limb Ataxia 0[x]  1[]  2[]  3[]  UN[]     8 Sensory 0[]  1[x]  2[]  UN[]      9 Best Language 0[]  1[]  2[x]  3[]      10 Dysarthria 0[]  1[x]  2[]  UN[]      11 Extinct. and Inattention 0[x]  1[]  2[]       TOTAL: 13      ROS  Unable to ascertain due to aphasia, confusion.  Past History   Past Medical History:  Diagnosis Date  . Hypertension   . Stroke [facility])     No past surgical history on file.  Family History: No family history on file.  Social History  reports that she has never smoked. She has never used smokeless tobacco. She reports that she does not drink alcohol and does not use drugs.  Allergies  Allergen Reactions  . Metformin Diarrhea and Other (See Comments)    Medications   Current Facility-Administered Medications:  .  sodium chloride flush (NS) 0.9 % injection 3 mL, 3 mL, Intravenous, Once, Long, Arlyss Repress, MD  Current Outpatient Medications:  .  ARIPiprazole (ABILIFY) 10 MG tablet, Take 1 tablet (10 mg total) by mouth daily., Disp: 90 tablet, Rfl: 0 .  Ascorbic Acid 500 MG/5ML LIQD,  Take by mouth. (Patient not taking: Reported on 03/27/2023), Disp: , Rfl:  .  Ascorbic Acid 500 MG/5ML SYRP, Take by mouth.  (Patient not taking: Reported on 03/27/2023), Disp: , Rfl:  .  aspirin 325 MG EC tablet, Take by mouth., Disp: , Rfl:  .  aspirin 325 MG tablet, Take 325 mg by mouth daily.  (Patient not taking: Reported on 03/27/2023), Disp: , Rfl:  .  atorvastatin (LIPITOR) 80 MG tablet, Take by mouth., Disp: , Rfl:  .  atorvastatin (LIPITOR) 80 MG tablet, Take by mouth. (Patient not taking: Reported on 03/27/2023), Disp: , Rfl:  .  baclofen (LIORESAL) 10 MG tablet, Take 10 mg by mouth 3 (three) times daily. , Disp: , Rfl:  .  benzonatate (TESSALON) 200 MG capsule, TAKE ONE CAPSULE BY MOUTH THREE TIMES A DAY FOR UP TO 7 DAYS (Patient not taking: Reported on 03/27/2023), Disp: , Rfl:  .  Blood Glucose Monitoring Suppl (FIFTY50 GLUCOSE METER 2.0) w/Device KIT, See admin instructions.  (Patient not taking: Reported on 03/27/2023), Disp: , Rfl:  .  calcium-vitamin D (OSCAL WITH D) 500-200 MG-UNIT TABS tablet, Take by mouth.  (Patient not taking: Reported on 03/27/2023), Disp: , Rfl:  .  clopidogrel (PLAVIX) 75 MG tablet, Take 75 mg by mouth daily. Patient taking ASA instead, Disp: , Rfl:  .  Cysteamine Bitartrate (PROCYSBI) 300 MG PACK, Use 1 each as directed. (Patient not taking: Reported on 03/27/2023), Disp: , Rfl:  .  Docusate Sodium (DSS) 100 MG CAPS, Take by mouth.  (Patient not taking: Reported on 03/27/2023), Disp: , Rfl:  .  esomeprazole (NEXIUM) 40 MG capsule, Take by mouth.  (Patient not taking: Reported on 03/27/2023), Disp: , Rfl:  .  ferrous sulfate 300 (60 Fe) MG/5ML syrup, Take by mouth.  (Patient not taking: Reported on 03/27/2023), Disp: , Rfl:  .  fluticasone (FLONASE) 50 MCG/ACT nasal spray, Place into the nose., Disp: , Rfl:  .  glucose blood (PRECISION QID TEST) test strip, Use 1 strip as directed. (Patient not taking: Reported on 03/27/2023), Disp: , Rfl:  .  Incontinence Supply Disposable (UNDERPADS) MISC, 1 CASE + 5 PACKS PEACH!, Disp: , Rfl:  .  ketoconazole (NIZORAL) 2 % cream, Apply topically daily. (Patient not taking: Reported on 03/27/2023), Disp: , Rfl:  .  medroxyPROGESTERone (DEPO-PROVERA) 150 MG/ML injection, Inject into the muscle. , Disp: , Rfl:  .  medroxyPROGESTERone Acetate 150 MG/ML SUSY, Inject into the muscle.  (Patient not taking: Reported on 03/27/2023), Disp: , Rfl:  .  medroxyPROGESTERone Acetate 150 MG/ML SUSY, Inject as directed See admin instructions. (Patient not taking: Reported on 03/27/2023), Disp: , Rfl:  .  metoprolol tartrate (LOPRESSOR) 25 MG tablet, Take by mouth., Disp: , Rfl:  .  nitrofurantoin, macrocrystal-monohydrate, (MACROBID) 100 MG capsule, Take 100 mg by mouth every 12 (twelve) hours. (Patient not taking: Reported on 03/27/2023), Disp: , Rfl:  .  ondansetron (ZOFRAN-ODT) 8 MG disintegrating tablet, 8mg  ODT q4 hours prn nausea  (Patient not taking: Reported on 03/27/2023), Disp: 10 tablet, Rfl: 0 .  ramipril (ALTACE) 5 MG capsule, Take 5 mg by mouth daily. , Disp: , Rfl:  .  sertraline (ZOLOFT) 50 MG tablet, Take 3 tablets (150 mg total) by mouth daily., Disp: 270 tablet, Rfl: 0 .  Skin Protectants, Misc. (DERMACERIN) CREA, Apply topically.  (Patient not taking: Reported on 03/27/2023), Disp: , Rfl:  .  valACYclovir (VALTREX) 500 MG tablet, 1 tab by mouth three times daily for 5 days  as needed (Patient not taking: Reported on 03/27/2023), Disp: , Rfl:  .  Vitamin D, Ergocalciferol, (DRISDOL) 1.25 MG (50000 UNIT) CAPS capsule, Take 50,000 Units by mouth once a week., Disp: , Rfl:   Vitals   Vitals:   April 24, 2023 2300  Weight: 84.6 kg  Height: 5\' 7"  (1.702 m)    Body mass index is 29.21 kg/m.  Physical Exam   General: Laying comfortably in bed; in no acute distress.  HENT: Normal oropharynx and mucosa. Normal external appearance of ears and nose.  Neck: Supple, no pain or tenderness  CV: No JVD. No peripheral edema.  Pulmonary: Symmetric Chest rise. Normal respiratory effort.  Abdomen: Soft to touch, non-tender. Ext: No cyanosis, edema, R arm contracted. Skin: No rash. Normal palpation of skin.   Musculoskeletal: Normal digits and nails by inspection. No clubbing.  Neurologic Examination  Mental status/Cognition: Alert, oriented to self only, reports she is 23. Does not know the month and year but this could possibly be due to aphasia. good attention. Speech/language: non fluent, comprehension intact intermittently to simple commands, names only some out of many objects. Cranial nerves:   CN II Pupils equal and reactive to light, no VF deficits    CN III,IV,VI EOM intact, no gaze preference or deviation, no nystagmus    CN V normal sensation in V1, V2, and V3 segments bilaterally    CN VII no asymmetry, no nasolabial fold flattening    CN VIII normal hearing to speech    CN IX & X normal palatal elevation, no  uvular deviation    CN XI 5/5 head turn and 5/5 shoulder shrug bilaterally    CN XII midline tongue protrusion    Motor:  Muscle bulk: normal, tone contracture on the right. Mvmt Root Nerve  Muscle Right Left Comments  SA C5/6 Ax Deltoid 0 5   EF C5/6 Mc Biceps 0 5   EE C6/7/8 Rad Triceps 0 5   WF C6/7 Med FCR     WE C7/8 PIN ECU     F Ab C8/T1 U ADM/FDI 1 5   HF L1/2/3 Fem Illopsoas 1 5   KE L2/3/4 Fem Quad     DF L4/5 D Peron Tib Ant 1 5   PF S1/2 Tibial Grc/Sol 1 5    Reflexes:  Right Left Comments  Pectoralis      Biceps (C5/6)     Brachioradialis (C5/6)      Triceps (C6/7)      Patellar (L3/4)      Achilles (S1)      Hoffman      Plantar     Jaw jerk    Sensation:  Light touch    Pin prick    Temperature    Vibration   Proprioception    Coordination/Complex Motor:  - Finger to Nose *** - Heel to shin *** - Rapid alternating movement *** - Gait: Stride length ***. Arm swing ***. Base width ***   Labs/Imaging/Neurodiagnostic studies   CBC:  Recent Labs  Lab 04-24-2023 2330 2023/04/24 2338  WBC 12.4*  --   NEUTROABS 5.6  --   HGB 11.4* 12.6  HCT 36.3 37.0  MCV 86.0  --   PLT 288  --    Basic Metabolic Panel:  Lab Results  Component Value Date   NA 140 04/24/2023   K 4.6 2023/04/24   CO2 16 (L) 07/04/2022   GLUCOSE 85 04-24-2023   BUN 27 (H) 04/24/2023   CREATININE  2.00 (H) 04/14/2023   CALCIUM 8.3 (L) 07/04/2022   GFRNONAA 32 (L) 07/04/2022   GFRAA >60 11/10/2011   Lipid Panel: No results found for: "LDLCALC" HgbA1c: No results found for: "HGBA1C" Urine Drug Screen: No results found for: "LABOPIA", "COCAINSCRNUR", "LABBENZ", "AMPHETMU", "THCU", "LABBARB"  Alcohol Level No results found for: "ETH" INR No results found for: "INR" APTT No results found for: "APTT" AED levels: No results found for: "PHENYTOIN", "ZONISAMIDE", "LAMOTRIGINE", "LEVETIRACETA"  CT Head without contrast(Personally reviewed): ***  CT angio Head and Neck with  contrast(Personally reviewed): ***  MR Angio head without contrast and Carotid Duplex BL(Personally reviewed): ***  MRI Brain(Personally reviewed): ***  Neurodiagnostics rEEG:  ***  ASSESSMENT   ZULEIKA GALLUS is a 38 y.o. female ***  RECOMMENDATIONS  *** ______________________________________________________________________    Welton Flakes, MD [facility]

## 2023-04-14 NOTE — ED Provider Notes (Signed)
 Emergency Department Provider Note   I have reviewed the triage vital signs and the nursing notes.   HISTORY  Chief Complaint Code Stroke   HPI Stacey [name] is a 38 y.o. female with past history of hypertension, stroke, hypercoagulable state presents to the emergency department with acute onset speech change.  Patient arrives by [facility] as a code stroke.  Last normal at 8 PM.  Patient with history of prior strokes and hypercoagulable state.  She is not currently on anticoagulation.  Reports that she has been taking Plavix.  Mom notes some increased confusion as well as new onset speech disturbance.  Past Medical History:  Diagnosis Date   Hypertension    Stroke [facility])     Review of Systems  Constitutional: No fever/chills Cardiovascular: Denies chest pain. Respiratory: Denies shortness of breath. Gastrointestinal: No abdominal pain.  No nausea, no vomiting.   Skin: Negative for rash. Neurological: Negative for headaches, focal weakness or numbness.   ____________________________________________   PHYSICAL EXAM:  VITAL SIGNS: Vitals:   04/15/23 0145 04/15/23 0230  BP: 137/81 (!) 146/89  Pulse:    Resp: 15 15  Temp:    SpO2:     Constitutional: Alert and oriented. Well appearing and in no acute distress. Eyes: Conjunctivae are normal. Head: Atraumatic. Nose: No congestion/rhinnorhea. Mouth/Throat: Mucous membranes are moist.   Neck: No stridor.   Cardiovascular: Normal rate, regular rhythm. Good peripheral circulation. Grossly normal heart sounds.   Respiratory: Normal respiratory effort.  No retractions. Lungs CTAB. Gastrointestinal: Soft and nontender. No distention.  Musculoskeletal: No lower extremity tenderness nor edema. No gross deformities of extremities. Neurologic:  Normal speech and language. Baseline right hemiparesis.  Skin:  Skin is warm, dry and intact. No rash noted.  ____________________________________________    LABS (all labs ordered are listed, but only abnormal results are displayed)  Labs Reviewed  CBC - Abnormal; Notable for the following components:      Result Value   WBC 12.4 (*)    Hemoglobin 11.4 (*)    RDW 16.6 (*)    All other components within normal limits  DIFFERENTIAL - Abnormal; Notable for the following components:   Lymphs Abs 5.7 (*)    All other components within normal limits  COMPREHENSIVE METABOLIC PANEL WITH GFR - Abnormal; Notable for the following components:   CO2 18 (*)    BUN 26 (*)    Creatinine, Ser 1.93 (*)    Calcium 8.6 (*)    Albumin 3.1 (*)    GFR, Estimated 33 (*)    All other components within normal limits  I-STAT CHEM 8, ED - Abnormal; Notable for the following components:   BUN 27 (*)    Creatinine, Ser 2.00 (*)    TCO2 19 (*)    All other components within normal limits  CBG MONITORING, ED - Abnormal; Notable for the following components:   Glucose-Capillary 53 (*)    All other components within normal limits  CBG MONITORING, ED - Abnormal; Notable for the following components:   Glucose-Capillary 101 (*)    All other components within normal limits  PROTIME-INR  APTT  ETHANOL  HCG, SERUM, QUALITATIVE  CBG MONITORING, ED   ____________________________________________  EKG   EKG Interpretation Date/Time:  Saturday April 15 2023 01:42:28 EDT Ventricular Rate:  63 PR Interval:  212 QRS Duration:  85 QT Interval:  405 QTC Calculation: 415 R Axis:   7  Text Interpretation: Sinus rhythm Prolonged PR interval  Confirmed by Abby Hocking [phone]) on 04/15/2023 3:28:57 AM        ____________________________________________  RADIOLOGY  MR BRAIN WO CONTRAST Result Date: 04/15/2023 CLINICAL DATA:  Initial evaluation for acute neuro deficit, stroke suspected. EXAM: MRI HEAD WITHOUT CONTRAST TECHNIQUE: Multiplanar, multiecho pulse sequences of the brain and surrounding structures were obtained without intravenous contrast. COMPARISON:   Prior CT from 04/14/2023. FINDINGS: Brain: Examination moderately degraded by motion artifact. Cerebral volume within normal limits. Patchy T2/FLAIR hyperintensity involving the periventricular and deep white matter both cerebral hemispheres, consistent with chronic small vessel ischemic disease. Encephalomalacia and gliosis involving the anterior left frontal lobe, likely related to prior infarct, left ACA and MCA distribution. Additional remote infarcts involving the right basal ganglia and left periatrial region. Small remote left cerebellar infarct. No convincing foci of restricted diffusion to suggest acute or subacute ischemia. Note made of apparent vague diffusion signal abnormality involving the right periatrial region on axial DWI sequence (series 2, image 27), felt to be consistent with artifact. Gray-white matter differentiation maintained. No acute intracranial hemorrhage. Minor chronic hemosiderin staining noted at the anterior left frontal region. No other significant chronic intracranial blood products. No mass lesion, midline shift or mass effect. No hydrocephalus or extra-axial fluid collection. Pituitary gland within normal limits. Vascular: Attenuated flow void within the left MCA distribution, in keeping with the chronic left MCA territory infarct. Major intracranial vascular flow voids are otherwise grossly maintained on this motion degraded exam. Skull and upper cervical spine: Craniocervical junction grossly within normal limits. Bone marrow signal intensity grossly normal. No scalp soft tissue abnormality. Sinuses/Orbits: Globes orbital soft tissues within normal limits. Paranasal sinuses are clear. No significant mastoid effusion. Other: None. IMPRESSION: 1. Motion degraded exam. 2. No acute intracranial abnormality. 3. Multiple chronic infarcts involving the left ACA/MCA distribution, right basal ganglia, left periatrial region, and left cerebellum. 4. Underlying chronic microvascular  ischemic disease. Electronically Signed   By: Virgia Griffins M.D.   On: 04/15/2023 02:53   CT HEAD CODE STROKE WO CONTRAST Result Date: 04/14/2023 CLINICAL DATA:  Code stroke. EXAM: CT HEAD WITHOUT CONTRAST TECHNIQUE: Contiguous axial images were obtained from the base of the skull through the vertex without intravenous contrast. RADIATION DOSE REDUCTION: This exam was performed according to the departmental dose-optimization program which includes automated exposure control, adjustment of the mA and/or kV according to patient size and/or use of iterative reconstruction technique. COMPARISON:  Prior study from 07/06/2022. FINDINGS: Brain: Chronic encephalomalacia and gliosis involving the anterior left frontal lobe noted. Chronic ischemic infarcts at the right basal ganglia and left periatrial region. Few dystrophic calcifications present about the brainstem. Appearance is stable from prior. No acute intracranial hemorrhage. No acute large vessel territory infarct. No mass lesion or midline shift. No hydrocephalus or extra-axial fluid collection. Vascular: No abnormal hyperdense vessel. Skull: Scalp soft tissues demonstrate no acute finding. Calvarium intact. Sinuses/Orbits: Left gaze noted. Paranasal sinuses are clear. No mastoid effusion. Other: None. ASPECTS [facility] Stroke Program Early CT Score) - Ganglionic level infarction (caudate, lentiform nuclei, internal capsule, insula, M1-M3 cortex): 7 - Supraganglionic infarction (M4-M6 cortex): 3 Total score (0-10 with 10 being normal): 10 IMPRESSION: 1. Stable head CT.  No acute intracranial abnormality. 2. ASPECTS is 10. 3. Multiple remote infarcts chronic ischemic changes with multiple remote infarcts involving the left greater than right cerebral hemispheres. These results were communicated to Dr. Murvin Arthurs at 11:33 pm on 04/14/2023 by text page via the [facility] messaging system. Electronically Signed   By: Amye Baller  Tresea Frost M.D.   On: 04/14/2023 23:36     ____________________________________________   PROCEDURES  Procedure(s) performed:   Procedures  None  ____________________________________________   INITIAL IMPRESSION / ASSESSMENT AND PLAN / ED COURSE  Pertinent labs & imaging results that were available during my care of the patient were reviewed by me and considered in my medical decision making (see chart for details).   This patient is Presenting for Evaluation of AMS, which does require a range of treatment options, and is a complaint that involves a high risk of morbidity and mortality.  The Differential Diagnoses includes but is not exclusive to alcohol, illicit or prescription medications, intracranial pathology such as stroke, intracerebral hemorrhage, fever or infectious causes including sepsis, hypoxemia, uremia, trauma, endocrine related disorders such as diabetes, hypoglycemia, thyroid-related diseases, etc.   Critical Interventions-    Medications  sodium chloride flush (NS) 0.9 % injection 3 mL (has no administration in time range)  dextrose (GLUTOSE) oral gel 40% (peds > 20kg and adults) (31 g Oral Given 04/14/23 2338)    Reassessment after intervention: Symptoms dramatically improved with increased CBG.   I did obtain Additional Historical Information from EMS.   I decided to review pertinent External Data, and in summary patient with prior CVA history.    Clinical Laboratory Tests Ordered, included CMP shows creatinine 1.93.  No electrolyte disturbance.  CBC without severe anemia.  Radiologic Tests Ordered, included CT head and MRI brain. I independently interpreted the images and agree with radiology interpretation.   Cardiac Monitor Tracing which shows NSR.    Social Determinants of Health Risk patient is a non-smoker.   Consult complete with Neurology. No TNK. MRI negative. No emergent CVA/TIA concern. Likely hypoglycemia. Patient to follow with PCP and Neuro as an outpatient to discuss  anticoagulation.   Medical Decision Making: Summary:  Patient presents emergency department as a code stroke with speech disturbance.  She has baseline neurodeficits from prior strokes.  CVA workup in process.   Reevaluation with update and discussion with patient and mom at bedside.  MRI negative.  CBG normal and patient feeling well after increased blood sugar.  We discussed anticoagulation.  She is not sure why she was taken off of Coumadin.  She plans to call her PCP and neurologist in the coming week to discuss this further.  Patient's presentation is most consistent with acute presentation with potential threat to life or bodily function.   Disposition: discharge  ____________________________________________  FINAL CLINICAL IMPRESSION(S) / ED DIAGNOSES  Final diagnoses:  Slurred speech  Hypoglycemia    Note:  This document was prepared using Dragon voice recognition software and may include unintentional dictation errors.  Abby Hocking, MD, [facility]    Edithe Dobbin, Shereen Dike, MD 04/15/23 [phone]

## 2023-04-14 NOTE — ED Notes (Signed)
 Delay in lab draw.  KM

## 2023-04-15 LAB — COMPREHENSIVE METABOLIC PANEL WITH GFR
ALT: 16 U/L (ref 0–44)
AST: 15 U/L (ref 15–41)
Albumin: 3.1 g/dL — ABNORMAL LOW (ref 3.5–5.0)
Alkaline Phosphatase: 91 U/L (ref 38–126)
Anion gap: 9 (ref 5–15)
BUN: 26 mg/dL — ABNORMAL HIGH (ref 6–20)
CO2: 18 mmol/L — ABNORMAL LOW (ref 22–32)
Calcium: 8.6 mg/dL — ABNORMAL LOW (ref 8.9–10.3)
Chloride: 111 mmol/L (ref 98–111)
Creatinine, Ser: 1.93 mg/dL — ABNORMAL HIGH (ref 0.44–1.00)
GFR, Estimated: 33 mL/min — ABNORMAL LOW (ref >60)
Glucose, Bld: 91 mg/dL (ref 70–99)
Potassium: 4.5 mmol/L (ref 3.5–5.1)
Sodium: 138 mmol/L (ref 135–145)
Total Bilirubin: 0.4 mg/dL (ref 0.0–1.2)
Total Protein: 7.5 g/dL (ref 6.5–8.1)

## 2023-04-15 LAB — ETHANOL: Alcohol, Ethyl (B): <10 mg/dL (ref <10)

## 2023-04-15 LAB — CBG MONITORING, ED
Glucose-Capillary: 101 mg/dL — ABNORMAL HIGH (ref 70–99)
Glucose-Capillary: 84 mg/dL (ref 70–99)

## 2023-04-15 MED ORDER — GLUCOSE 40 % PO GEL
1.0000 | Freq: Once | ORAL | Status: AC
  Administered 2023-04-14: 31 g via ORAL

## 2023-04-15 NOTE — Discharge Instructions (Signed)
 You were seen in the emerged part today with speech change.  Your MRI did not show any stroke.  Please discuss further with your primary care doctor as well as your neurologist if you should be on anticoagulation.  Return to the emergency department any new or suddenly worsening symptoms.

## 2023-04-15 NOTE — ED Notes (Signed)
 Verbal report given to primary RN Larinda Plover C.

## 2023-04-15 NOTE — ED Notes (Signed)
 Oral glucose given as RN was unable to obtain IV access

## 2023-04-15 NOTE — ED Triage Notes (Signed)
 CODE STROKE  Pt arrives via [facility] from home called Code Stroke. Pt was LKN at 8 pm 4/11. Has a hx of previous stroke at 28 with right sided deficits. Concern today for new onset of aphasia and increased confusion

## 2023-04-15 NOTE — ED Notes (Signed)
Pt arrives to MRI.

## 2023-04-15 NOTE — ED Notes (Signed)
 Delay in completing MRI d/t technical issues. Pt transported to second MRI machine

## 2023-04-15 NOTE — ED Notes (Signed)
 This RN reviewed discharge instructions with patient. She verbalized understanding and denied any further questions. PT well appearing upon discharge and reports no pain. Pt wheeled out to lobby and is riding home with family.

## 2023-04-16 ENCOUNTER — Emergency Department (HOSPITAL_COMMUNITY): Payer: MEDICAID

## 2023-04-16 ENCOUNTER — Other Ambulatory Visit: Payer: Self-pay

## 2023-04-16 ENCOUNTER — Inpatient Hospital Stay (HOSPITAL_COMMUNITY)
Admission: EM | Admit: 2023-04-16 | Discharge: 2023-04-22 | DRG: 644 | Payer: MEDICAID | Attending: Internal Medicine | Admitting: Internal Medicine

## 2023-04-16 ENCOUNTER — Encounter (HOSPITAL_COMMUNITY): Payer: Self-pay | Admitting: Pharmacy Technician

## 2023-04-16 DIAGNOSIS — I69351 Hemiplegia and hemiparesis following cerebral infarction affecting right dominant side: Secondary | ICD-10-CM

## 2023-04-16 DIAGNOSIS — Z79899 Other long term (current) drug therapy: Secondary | ICD-10-CM

## 2023-04-16 DIAGNOSIS — F419 Anxiety disorder, unspecified: Secondary | ICD-10-CM | POA: Diagnosis present

## 2023-04-16 DIAGNOSIS — N179 Acute kidney failure, unspecified: Secondary | ICD-10-CM | POA: Diagnosis present

## 2023-04-16 DIAGNOSIS — E11649 Type 2 diabetes mellitus with hypoglycemia without coma: Secondary | ICD-10-CM

## 2023-04-16 DIAGNOSIS — N1832 Chronic kidney disease, stage 3b: Secondary | ICD-10-CM | POA: Diagnosis present

## 2023-04-16 DIAGNOSIS — Z7982 Long term (current) use of aspirin: Secondary | ICD-10-CM

## 2023-04-16 DIAGNOSIS — E162 Hypoglycemia, unspecified: Secondary | ICD-10-CM | POA: Diagnosis not present

## 2023-04-16 DIAGNOSIS — I69320 Aphasia following cerebral infarction: Secondary | ICD-10-CM

## 2023-04-16 DIAGNOSIS — Z888 Allergy status to other drugs, medicaments and biological substances status: Secondary | ICD-10-CM

## 2023-04-16 DIAGNOSIS — F32A Depression, unspecified: Secondary | ICD-10-CM | POA: Diagnosis present

## 2023-04-16 DIAGNOSIS — E16A2 Hypoglycemia level 2: Principal | ICD-10-CM | POA: Diagnosis present

## 2023-04-16 DIAGNOSIS — R4701 Aphasia: Secondary | ICD-10-CM

## 2023-04-16 DIAGNOSIS — E213 Hyperparathyroidism, unspecified: Secondary | ICD-10-CM | POA: Diagnosis present

## 2023-04-16 DIAGNOSIS — I6930 Unspecified sequelae of cerebral infarction: Secondary | ICD-10-CM

## 2023-04-16 DIAGNOSIS — R29810 Facial weakness: Secondary | ICD-10-CM | POA: Diagnosis present

## 2023-04-16 DIAGNOSIS — E161 Other hypoglycemia: Secondary | ICD-10-CM | POA: Diagnosis present

## 2023-04-16 DIAGNOSIS — I129 Hypertensive chronic kidney disease with stage 1 through stage 4 chronic kidney disease, or unspecified chronic kidney disease: Secondary | ICD-10-CM | POA: Diagnosis present

## 2023-04-16 DIAGNOSIS — I10 Essential (primary) hypertension: Secondary | ICD-10-CM | POA: Diagnosis present

## 2023-04-16 DIAGNOSIS — R531 Weakness: Secondary | ICD-10-CM | POA: Diagnosis not present

## 2023-04-16 DIAGNOSIS — E785 Hyperlipidemia, unspecified: Secondary | ICD-10-CM | POA: Diagnosis present

## 2023-04-16 DIAGNOSIS — D631 Anemia in chronic kidney disease: Secondary | ICD-10-CM | POA: Diagnosis present

## 2023-04-16 DIAGNOSIS — Z7902 Long term (current) use of antithrombotics/antiplatelets: Secondary | ICD-10-CM

## 2023-04-16 LAB — I-STAT CHEM 8, ED
BUN: 27 mg/dL — ABNORMAL HIGH (ref 6–20)
Calcium, Ion: 1.08 mmol/L — ABNORMAL LOW (ref 1.15–1.40)
Chloride: 113 mmol/L — ABNORMAL HIGH (ref 98–111)
Creatinine, Ser: 2.1 mg/dL — ABNORMAL HIGH (ref 0.44–1.00)
Glucose, Bld: 83 mg/dL (ref 70–99)
HCT: 37 % (ref 36.0–46.0)
Hemoglobin: 12.6 g/dL (ref 12.0–15.0)
Potassium: 5 mmol/L (ref 3.5–5.1)
Sodium: 141 mmol/L (ref 135–145)
TCO2: 20 mmol/L — ABNORMAL LOW (ref 22–32)

## 2023-04-16 LAB — COMPREHENSIVE METABOLIC PANEL WITH GFR
ALT: 17 U/L (ref 0–44)
AST: 14 U/L — ABNORMAL LOW (ref 15–41)
Albumin: 3.2 g/dL — ABNORMAL LOW (ref 3.5–5.0)
Alkaline Phosphatase: 102 U/L (ref 38–126)
Anion gap: 8 (ref 5–15)
BUN: 27 mg/dL — ABNORMAL HIGH (ref 6–20)
CO2: 19 mmol/L — ABNORMAL LOW (ref 22–32)
Calcium: 9 mg/dL (ref 8.9–10.3)
Chloride: 111 mmol/L (ref 98–111)
Creatinine, Ser: 1.88 mg/dL — ABNORMAL HIGH (ref 0.44–1.00)
GFR, Estimated: 34 mL/min — ABNORMAL LOW (ref >60)
Glucose, Bld: 84 mg/dL (ref 70–99)
Potassium: 4.9 mmol/L (ref 3.5–5.1)
Sodium: 138 mmol/L (ref 135–145)
Total Bilirubin: 0.5 mg/dL (ref 0.0–1.2)
Total Protein: 7.6 g/dL (ref 6.5–8.1)

## 2023-04-16 LAB — CBC
HCT: 36.8 % (ref 36.0–46.0)
Hemoglobin: 11.3 g/dL — ABNORMAL LOW (ref 12.0–15.0)
MCH: 26.9 pg (ref 26.0–34.0)
MCHC: 30.7 g/dL (ref 30.0–36.0)
MCV: 87.6 fL (ref 80.0–100.0)
Platelets: 296 10*3/uL (ref 150–400)
RBC: 4.2 MIL/uL (ref 3.87–5.11)
RDW: 16.7 % — ABNORMAL HIGH (ref 11.5–15.5)
WBC: 13.4 10*3/uL — ABNORMAL HIGH (ref 4.0–10.5)
nRBC: 0 % (ref 0.0–0.2)

## 2023-04-16 LAB — CBG MONITORING, ED
Glucose-Capillary: 179 mg/dL — ABNORMAL HIGH (ref 70–99)
Glucose-Capillary: 36 mg/dL — CL (ref 70–99)
Glucose-Capillary: 89 mg/dL (ref 70–99)
Glucose-Capillary: 149 mg/dL — ABNORMAL HIGH (ref 70–99)
Glucose-Capillary: 207 mg/dL — ABNORMAL HIGH (ref 70–99)
Glucose-Capillary: 48 mg/dL — ABNORMAL LOW (ref 70–99)

## 2023-04-16 LAB — DIFFERENTIAL
Abs Immature Granulocytes: 0.07 10*3/uL (ref 0.00–0.07)
Basophils Absolute: 0 10*3/uL (ref 0.0–0.1)
Basophils Relative: 0 %
Eosinophils Absolute: 0.3 10*3/uL (ref 0.0–0.5)
Eosinophils Relative: 2 %
Immature Granulocytes: 1 %
Lymphocytes Relative: 42 %
Lymphs Abs: 5.6 10*3/uL — ABNORMAL HIGH (ref 0.7–4.0)
Monocytes Absolute: 1 10*3/uL (ref 0.1–1.0)
Monocytes Relative: 8 %
Neutro Abs: 6.4 10*3/uL (ref 1.7–7.7)
Neutrophils Relative %: 47 %

## 2023-04-16 LAB — URINALYSIS, ROUTINE W REFLEX MICROSCOPIC
Bilirubin Urine: NEGATIVE
Glucose, UA: NEGATIVE mg/dL
Hgb urine dipstick: NEGATIVE
Ketones, ur: NEGATIVE mg/dL
Leukocytes,Ua: NEGATIVE
Nitrite: NEGATIVE
Protein, ur: 100 mg/dL — AB
Specific Gravity, Urine: 1.008 (ref 1.005–1.030)
pH: 7 (ref 5.0–8.0)

## 2023-04-16 LAB — PROTIME-INR
INR: 1 (ref 0.8–1.2)
Prothrombin Time: 13.2 s (ref 11.4–15.2)

## 2023-04-16 LAB — HCG, SERUM, QUALITATIVE: Preg, Serum: NEGATIVE

## 2023-04-16 LAB — ETHANOL: Alcohol, Ethyl (B): <10 mg/dL (ref <10)

## 2023-04-16 LAB — APTT: aPTT: 26 s (ref 24–36)

## 2023-04-16 MED ORDER — SODIUM BICARBONATE 650 MG PO TABS
650.0000 mg | ORAL_TABLET | Freq: Two times a day (BID) | ORAL | Status: DC
  Administered 2023-04-16 – 2023-04-22 (×12): 650 mg via ORAL
  Filled 2023-04-16 (×12): qty 1

## 2023-04-16 MED ORDER — ASPIRIN 325 MG PO TABS
325.0000 mg | ORAL_TABLET | Freq: Every day | ORAL | Status: DC
  Administered 2023-04-17 – 2023-04-22 (×6): 325 mg via ORAL
  Filled 2023-04-16 (×6): qty 1

## 2023-04-16 MED ORDER — METOPROLOL TARTRATE 25 MG PO TABS
25.0000 mg | ORAL_TABLET | Freq: Every day | ORAL | Status: DC
  Administered 2023-04-17 – 2023-04-22 (×6): 25 mg via ORAL
  Filled 2023-04-16 (×6): qty 1

## 2023-04-16 MED ORDER — ACETAMINOPHEN 650 MG RE SUPP: 650.0000 mg | Freq: Four times a day (QID) | RECTAL | Status: DC | PRN

## 2023-04-16 MED ORDER — SERTRALINE HCL 25 MG PO TABS
150.0000 mg | ORAL_TABLET | Freq: Every day | ORAL | Status: DC
  Administered 2023-04-17 – 2023-04-22 (×6): 150 mg via ORAL
  Filled 2023-04-16 (×6): qty 2

## 2023-04-16 MED ORDER — RAMIPRIL 1.25 MG PO CAPS
2.5000 mg | ORAL_CAPSULE | Freq: Every day | ORAL | Status: DC
  Administered 2023-04-17 – 2023-04-21 (×5): 2.5 mg via ORAL
  Filled 2023-04-16 (×5): qty 2

## 2023-04-16 MED ORDER — SODIUM CHLORIDE 0.9% FLUSH
3.0000 mL | Freq: Once | INTRAVENOUS | Status: AC
  Administered 2023-04-16: 3 mL via INTRAVENOUS

## 2023-04-16 MED ORDER — ARIPIPRAZOLE 5 MG PO TABS
10.0000 mg | ORAL_TABLET | Freq: Every day | ORAL | Status: DC
  Administered 2023-04-17 – 2023-04-22 (×6): 10 mg via ORAL
  Filled 2023-04-16 (×6): qty 2

## 2023-04-16 MED ORDER — DEXTROSE 50 % IV SOLN
1.0000 | Freq: Once | INTRAVENOUS | Status: AC
  Administered 2023-04-16: 50 mL via INTRAVENOUS
  Filled 2023-04-16: qty 50

## 2023-04-16 MED ORDER — HEPARIN SODIUM (PORCINE) 5000 UNIT/ML IJ SOLN
5000.0000 [IU] | Freq: Three times a day (TID) | INTRAMUSCULAR | Status: DC
  Administered 2023-04-17 – 2023-04-22 (×17): 5000 [IU] via SUBCUTANEOUS
  Filled 2023-04-16 (×18): qty 1

## 2023-04-16 MED ORDER — DEXTROSE 50 % IV SOLN
1.0000 | Freq: Once | INTRAVENOUS | Status: AC
  Administered 2023-04-16: 50 mL via INTRAVENOUS

## 2023-04-16 MED ORDER — ATORVASTATIN CALCIUM 80 MG PO TABS
80.0000 mg | ORAL_TABLET | Freq: Every day | ORAL | Status: DC
  Administered 2023-04-17 – 2023-04-22 (×6): 80 mg via ORAL
  Filled 2023-04-16 (×6): qty 1

## 2023-04-16 MED ORDER — DEXTROSE-SODIUM CHLORIDE 5-0.9 % IV SOLN: INTRAVENOUS | Status: DC

## 2023-04-16 MED ORDER — BACLOFEN 10 MG PO TABS
10.0000 mg | ORAL_TABLET | Freq: Three times a day (TID) | ORAL | Status: DC
  Administered 2023-04-16 – 2023-04-22 (×18): 10 mg via ORAL
  Filled 2023-04-16 (×18): qty 1

## 2023-04-16 MED ORDER — ACETAMINOPHEN 325 MG PO TABS: 650.0000 mg | ORAL_TABLET | Freq: Four times a day (QID) | ORAL | Status: DC | PRN

## 2023-04-16 MED ORDER — ONDANSETRON HCL 4 MG PO TABS: 4.0000 mg | ORAL_TABLET | Freq: Four times a day (QID) | ORAL | Status: DC | PRN

## 2023-04-16 MED ORDER — SENNOSIDES-DOCUSATE SODIUM 8.6-50 MG PO TABS: 1.0000 | ORAL_TABLET | Freq: Every evening | ORAL | Status: DC | PRN

## 2023-04-16 MED ORDER — CLOPIDOGREL BISULFATE 75 MG PO TABS
75.0000 mg | ORAL_TABLET | Freq: Every day | ORAL | Status: DC
  Administered 2023-04-17 – 2023-04-22 (×6): 75 mg via ORAL
  Filled 2023-04-16 (×6): qty 1

## 2023-04-16 MED ORDER — ONDANSETRON HCL 4 MG/2ML IJ SOLN: 4.0000 mg | Freq: Four times a day (QID) | INTRAMUSCULAR | Status: DC | PRN

## 2023-04-16 NOTE — Consult Note (Signed)
 NEUROLOGY CONSULT NOTE   Date of service: April 16, 2023 Patient Name: Stacey [name] MRN:  [id number] DOB:  09-26-1985 Chief Complaint: "Worsening speech and right-sided weakness" Requesting Provider: Carin Charleston, MD  History of Present Illness  Stacey [name] is a 38 y.o. 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  She was seen in the emergency department 2 days ago with similar complaints and found to have hypoglycemia.  On EMS arrival today, they reported their glucometer read in the 80s, however on recheck, she arrived to the emergency department it was found to be 36.  She was given an amp of D50 with rapid improvement in her symptoms.  LKW: 12 AM Modified rankin score: 4-Needs assistance to walk and tend to bodily needs IV Thrombolysis: No, outside of window EVT: No, poor baseline NIHSS score: Eight     Past History   Past Medical History:  Diagnosis Date   Hypertension    Stroke [facility])     History reviewed. No pertinent surgical history.  Family History: History reviewed. No pertinent family history.  Social History  reports that she has never smoked. She has never used smokeless tobacco. She reports that she does not drink alcohol and does not use drugs.  Allergies  Allergen Reactions   Metformin Diarrhea    Medications  No current facility-administered medications for this encounter.  Current Outpatient Medications:    ARIPiprazole (ABILIFY) 10 MG tablet, Take 1 tablet (10 mg total) by mouth daily., Disp: 90 tablet, Rfl: 0   ascorbic acid (VITAMIN C) 500 MG tablet, Take 500 mg by mouth daily., Disp: , Rfl:    aspirin 325 MG tablet, Take 325 mg by mouth daily., Disp: , Rfl:    atorvastatin (LIPITOR) 80 MG tablet, Take by mouth., Disp: , Rfl:    baclofen (LIORESAL) 10 MG tablet, Take 10 mg by mouth 3 (three) times daily. , Disp: , Rfl:    clopidogrel (PLAVIX)  75 MG tablet, Take 75 mg by mouth daily. Patient taking ASA instead, Disp: , Rfl:    iron polysaccharides (NIFEREX) 150 MG capsule, Take 150 mg by mouth 2 (two) times daily., Disp: , Rfl:    medroxyPROGESTERone Acetate 150 MG/ML SUSY, Inject into the muscle., Disp: , Rfl:    metoprolol tartrate (LOPRESSOR) 25 MG tablet, Take 25 mg by mouth daily., Disp: , Rfl:    ramipril (ALTACE) 2.5 MG capsule, Take 2.5 mg by mouth daily., Disp: , Rfl:    sertraline (ZOLOFT) 50 MG tablet, Take 3 tablets (150 mg total) by mouth daily., Disp: 270 tablet, Rfl: 0   sodium bicarbonate 650 MG tablet, Take 650 mg by mouth 2 (two) times daily., Disp: , Rfl:    Vitamin D, Ergocalciferol, (DRISDOL) 1.25 MG (50000 UNIT) CAPS capsule, Take 50,000 Units by mouth once a week. (Patient not taking: Reported on 04/15/2023), Disp: , Rfl:   Vitals   Vitals:   04/16/23 1838 04/16/23 1845 04/16/23 1852  BP:  (!) 160/69 137/89  Pulse:  67 70  Resp:   14  Temp:   98.5 F (36.9 C)  SpO2:   99%  Weight: 84.2 kg      Body mass index is 29.07 kg/m.  Physical Exam   Constitutional: Appears well-developed and well-nourished.  Neurologic Examination    Neuro: Mental Status: Patient is awake, alert, she has a severe aphasia, though it does improve some  after glucose administration.  She is not able to answer questions reliably, but is able to tell me her name.  She is able to follow 1 out of 2 commands Cranial Nerves: II: She fixates and tracks, but does not blink to threat from either direction. Pupils are equal, round, and reactive to light.   III,IV, VI: EOMI without ptosis or diploplia.  VII: Facial movement with right facial weakness Motor: She has a significant right spastic hemiparesis, but is able to lift her right leg against gravity and hold it without drifting to the bed.  Once I assist her in lifting her right arm she is able to keep it up, but has difficulty lifting it on her own.  She has good movement on the  left.   Sensory: She endorses sensation, but this response is unreliable  Cerebellar: Does not perform   Labs/Imaging/Neurodiagnostic studies   CBC:  Recent Labs  Lab 05/07/2023 2330 07-May-2023 2338 04/16/23 1835 04/16/23 1845  WBC 12.4*  --  13.4*  --   NEUTROABS 5.6  --  6.4  --   HGB 11.4*   < > 11.3* 12.6  HCT 36.3   < > 36.8 37.0  MCV 86.0  --  87.6  --   PLT 288  --  296  --    < > = values in this interval not displayed.   Basic Metabolic Panel:  Lab Results  Component Value Date   NA 141 04/16/2023   K 5.0 04/16/2023   CO2 19 (L) 04/16/2023   GLUCOSE 83 04/16/2023   BUN 27 (H) 04/16/2023   CREATININE 2.10 (H) 04/16/2023   CALCIUM 9.0 04/16/2023   GFRNONAA 34 (L) 04/16/2023   GFRAA >60 11/10/2011   Lipid Panel: No results found for: "LDLCALC" HgbA1c: No results found for: "HGBA1C" Urine Drug Screen: No results found for: "LABOPIA", "COCAINSCRNUR", "LABBENZ", "AMPHETMU", "THCU", "LABBARB"  Alcohol Level     Component Value Date/Time   ETH <10 04/16/2023 1835   INR  Lab Results  Component Value Date   INR 1.0 04/16/2023   APTT  Lab Results  Component Value Date   APTT 26 04/16/2023   AED levels: No results found for: "PHENYTOIN", "ZONISAMIDE", "LAMOTRIGINE", "LEVETIRACETA"  CT Head without contrast(Personally reviewed): Significant old cerebrovascular disease is stable   ASSESSMENT   Stacey [name] is a 38 y.o. female with a history of previous stroke who presents with worsening of her deficits in the setting of hypoglycemia.  These deficits markedly improved following glucose administration, confirming this is the etiology.  RECOMMENDATIONS  Initially recommended CT was negative at the time of finalizing this note Avoid hypoglycemia Follow-up with outpatient neurology as previously scheduled Please call with further questions or concerns. ______________________________________________________________________    Flint Hummer, MD [facility]

## 2023-04-16 NOTE — ED Triage Notes (Signed)
 Pt bib ems as a code stroke. Per ems pt went to sleep around midnight and woke up this morning at 0900 with confusion, dysarthria, receptive and expressive aphasia along with R sided facial droop. Hx R sided weakness due to previous CVA, however facial droop is new. VSS with ems. CBG 83. 18G LAC.

## 2023-04-16 NOTE — H&P (Signed)
 History and Physical    Stacey [name] HQI:[id number] DOB: February 05, 1985 DOA: 04/16/2023  PCP: Sheryl Donna, PA-C  Patient coming from: Home  I have personally briefly reviewed patient's old medical records in [facility] Link  Chief Complaint: Worsening right-sided weakness and speech  HPI: Stacey [name] is a 39 y.o. 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  History supplemented by mother at bedside.  Patient was just seen in the ED on 4/11 for slurred speech and word finding difficulty.  She arrived as a code stroke and noted to be hypoglycemic.  She did undergo MRI brain which did not show any acute intracranial abnormality but did show multiple chronic infarcts involving the left ACA/MCA distribution, right basal ganglia, left periatrial region, and left cerebellum.  Symptoms resolved with correction of hypoglycemia which was felt to be the cause of her presentation and she was discharged to home.  Patient did well yesterday.  She had a full meal for dinner.  This morning she awoke around 0900 again with worsening speech including slurring and expressive aphasia which was worse than her baseline.  She had reported new right facial droop.  Her mother states that she did get patient up and made her something to eat.  As a day progressed her symptoms were not improving therefore EMS were called.  CBG was 83 with EMS.  She was brought to the ED for further evaluation.  Patient denies any chest pain, dyspnea, nausea, vomiting, abdominal pain, dysuria, diarrhea.  She ambulates with use of a cane at baseline.  ED Course  Labs/Imaging on admission: I have personally reviewed following labs and imaging studies.  Initial vitals showed BP 137/89, pulse 72, RR 14, temp 98.5 F, SpO2  99% on room air.  Labs showed CBG 36, serum glucose 84, sodium 138, potassium 4.9, bicarb 19, BUN 27, creatinine 1.88, WBC 13.4, hemoglobin 11.3, platelets 296.  UA negative for UTI.  Serum hCG negative.  Serum ethanol <10.  Formal chest x-ray negative for focal consolidation, edema, effusion.  CT head without contrast negative for evidence of acute intracranial abnormality.  Unchanged chronic infarcts noted.  Patient was given 1 amp D50 with resolution of symptoms.  Initially patient arrived as code stroke and was evaluated by neurology, code stroke was canceled after presentation was felt due to hypoglycemia.  Prior to discharge home patient developed recurrent symptoms and hypoglycemia with CBG 48.  Patient was given second amp of D50 and started on D5-NS maintenance fluids.  The hospitalist service was consulted to admit.  Review of Systems: All systems reviewed and are negative except as documented in history of present illness above.   Past Medical History:  Diagnosis Date   Hypertension    Stroke [facility])     History reviewed. No pertinent surgical history.  Social History: Social History   Tobacco Use   Smoking status: Never   Smokeless tobacco: Never  Vaping Use   Vaping status: Never Used  Substance Use Topics   Alcohol use: No   Drug use: No   Allergies  Allergen Reactions   Metformin Diarrhea    History reviewed. No pertinent family history.   Prior to Admission medications   Medication Sig Start Date End Date Taking? Authorizing Provider  ARIPiprazole (ABILIFY) 10 MG tablet Take 1 tablet (10  mg total) by mouth daily. 04/04/23 10/01/23 Yes Nwoko, Uchenna E, PA  ascorbic acid (VITAMIN C) 500 MG tablet Take 500 mg by mouth daily. 03/05/09  Yes [provider]  aspirin 325 MG tablet Take 325 mg by mouth daily.   Yes [provider]  atorvastatin (LIPITOR) 80 MG tablet Take by mouth. 05/15/20  Yes [provider]  baclofen (LIORESAL) 10 MG  tablet Take 10 mg by mouth 3 (three) times daily.    Yes [provider]  clopidogrel (PLAVIX) 75 MG tablet Take 75 mg by mouth daily. Patient taking ASA instead   Yes [provider]  iron polysaccharides (NIFEREX) 150 MG capsule Take 150 mg by mouth 2 (two) times daily. 04/05/23  Yes [provider]  medroxyPROGESTERone Acetate 150 MG/ML SUSY Inject into the muscle. 09/10/19  Yes [provider]  metoprolol tartrate (LOPRESSOR) 25 MG tablet Take 25 mg by mouth daily. 05/15/20  Yes [provider]  ramipril (ALTACE) 2.5 MG capsule Take 2.5 mg by mouth daily.   Yes [provider]  sertraline (ZOLOFT) 50 MG tablet Take 3 tablets (150 mg total) by mouth daily. 04/04/23 10/01/23 Yes Nwoko, Uchenna E, PA  sodium bicarbonate 650 MG tablet Take 650 mg by mouth 2 (two) times daily.   Yes [provider]    Physical Exam: Vitals:   04/16/23 2045 04/16/23 2130 04/16/23 2159 04/16/23 2322  BP: 103/83 (!) 148/100 (!) 135/91 (!) 128/58  Pulse:   65 92  Resp: 16 13 16 16   Temp:   98.4 F (36.9 C) 98.6 F (37 C)  TempSrc:    Oral  SpO2:  100% 93% 100%  Weight:       Constitutional: Resting in bed, NAD, calm, comfortable Eyes: EOMI, lids and conjunctivae normal ENMT: Mucous membranes are moist. Posterior pharynx clear of any exudate or lesions.Normal dentition.  Neck: normal, supple, no masses. Respiratory: clear to auscultation bilaterally, no wheezing, no crackles. Normal respiratory effort. No accessory muscle use.  Cardiovascular: Regular rate and rhythm, no murmurs / rubs / gallops. No extremity edema. 2+ pedal pulses. Abdomen: no tenderness, no masses palpated. Musculoskeletal: no clubbing / cyanosis. No joint deformity upper and lower extremities.  Contracture right hand. Skin: no rashes, lesions, ulcers. No induration Neurologic: Sensation intact. Strength 5/5 LUE and LLE, 1/5 RUE and RLE Psychiatric: Alert and oriented x 3. Normal  mood.   EKG: Personally reviewed. Sinus rhythm, rate 68, first-degree AV block.  No acute ischemic changes.  Similar to prior.  Assessment/Plan Principal Problem:   Hypoglycemia Active Problems:   History of CVA with residual deficit   Chronic kidney disease, stage 3b (HCC)   Hyperlipidemia, unspecified   Hypertension   Anemia due to stage 3b chronic kidney disease (HCC)   Stacey [name] is a 38 y.o. 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  Assessment and Plan: Recurrent symptomatic hypoglycemia: Presenting with recurrent hypoglycemia.  Potentially due to inadequate caloric intake.  No offending medications identified.  Denies surreptitious insulin use. - Started on D5-NS at 40 mL/hour - Encourage oral intake - CBG checks every 2 hours x 5  History of CVA with right-sided weakness and aphasia: Presenting with worsening of her baseline residual stroke symptoms in setting of hypoglycemia. - Continue aspirin, Plavix, atorvastatin  CKD stage IIIb: Renal function stable.  Continue sodium bicarb  tablets.  Hypertension: Continue Lopressor and ramipril.  Normocytic anemia: Hemoglobin stable at 11.3.  Hyperlipidemia: Continue atorvastatin.  Depression/anxiety: Continue sertraline and Abilify.   DVT prophylaxis: heparin injection 5,000 Units Start: 04/17/23 0600 Code Status: Full code Family Communication: Mother at bedside Disposition Plan: From home and likely discharge to home pending clinical progress Consults called: Neurology Severity of Illness: The appropriate patient status for this patient is OBSERVATION. Observation status is judged to be reasonable and necessary in order to provide the required intensity of service to ensure the patient's safety. The patient's presenting symptoms, physical  exam findings, and initial radiographic and laboratory data in the context of their medical condition is felt to place them at decreased risk for further clinical deterioration. Furthermore, it is anticipated that the patient will be medically stable for discharge from the hospital within 2 midnights of admission.   Edith Gores MD [facility]  If 7PM-7AM, please contact night-coverage [email or web address]  04/16/2023, 11:41 PM

## 2023-04-16 NOTE — Code Documentation (Addendum)
 Stroke Response Nurse Documentation Code Documentation  TERRELL SHIMKO is a 38 y.o. female arriving to [facility]  via [facility] on 4/13 with past medical hx of HTN. On aspirin 325 mg daily and clopidogrel 75 mg daily. Code stroke was activated by EMS.   Patient from home where she was LKW at 0000 and now complaining of dysarthria, aphasia, and right facial weakness.  Pt woke up at 0900 and her mother noticed her speech was harder to understand. She got her up for the day and made her something to eat. As the day progressed symptoms were not improving, therefor EMS was called.   Stroke team at the bedside on patient arrival. Patient to CT with team.   NIHSS 8, see documentation for details and code stroke times. Patient with disoriented, not following commands, right facial droop, right arm weakness, bilateral leg weakness, and Global aphasia  on exam.   The following imaging was completed:  CT Head. Patient is not a candidate for IV Thrombolytic due to LKW 0000. Patient is not a candidate for IR due to exam improving following treatment of hypoglycemia.   Care Plan: code stroke cancelled.   Process Delays Noted: CBG 36 treated with IV Dextrose prior to CT, repeat CBG 179.  Bedside handoff with ED RN Crystal.    Alto Jew Greenlee Ancheta  Rapid Response RN

## 2023-04-16 NOTE — ED Notes (Signed)
Code stroke cancelled 

## 2023-04-16 NOTE — ED Provider Notes (Addendum)
 [place] EMERGENC[facility] DEPARTMENT AT  HOSPITAL Provider Note   CSN: [id number] Arrival date & time: 04/16/23  1832     History  Chief Complaint  Patient presents with   Code Stroke    Stacey [name] is a 38 y.o. female.  38 year old female with past medical history of hypertension and CVA in the past with right-sided deficits presenting to the emergency department today with slurred speech and decreased mentation.  This apparently occurred just prior to arrival.  The patient had a similar episode 2 days ago.  She was found to be hypoglycemic at that time.  Does not have a history of diabetes.  The patient is evaluated by our stroke neurology team on arrival.  She was found to be hypoglycemic.  Stroke workup was initiated.  The patient was given dextrose.        Home Medications Prior to Admission medications   Medication Sig Start Date End Date Taking? Authorizing Provider  ARIPiprazole (ABILIFY) 10 MG tablet Take 1 tablet (10 mg total) by mouth daily. 04/04/23 10/01/23 Yes Nwoko, Uchenna E, PA  ascorbic acid (VITAMIN C) 500 MG tablet Take 500 mg by mouth daily. 03/05/09  Yes [provider]  aspirin 325 MG tablet Take 325 mg by mouth daily.   Yes [provider]  atorvastatin (LIPITOR) 80 MG tablet Take by mouth. 05/15/20  Yes [provider]  baclofen (LIORESAL) 10 MG tablet Take 10 mg by mouth 3 (three) times daily.    Yes [provider]  clopidogrel (PLAVIX) 75 MG tablet Take 75 mg by mouth daily. Patient taking ASA instead   Yes [provider]  iron polysaccharides (NIFEREX) 150 MG capsule Take 150 mg by mouth 2 (two) times daily. 04/05/23  Yes [provider]  medroxyPROGESTERone Acetate 150 MG/ML SUSY Inject into the muscle. 09/10/19  Yes [provider]  metoprolol tartrate (LOPRESSOR) 25 MG tablet Take 25 mg by mouth daily. 05/15/20  Yes [provider]  ramipril (ALTACE) 2.5 MG capsule Take 2.5  mg by mouth daily.   Yes [provider]  sertraline (ZOLOFT) 50 MG tablet Take 3 tablets (150 mg total) by mouth daily. 04/04/23 10/01/23 Yes Nwoko, Uchenna E, PA  sodium bicarbonate 650 MG tablet Take 650 mg by mouth 2 (two) times daily.   Yes [provider]      Allergies    Metformin    Review of Systems   Review of Systems  Neurological:  Positive for speech difficulty.  All other systems reviewed and are negative.   Physical Exam Updated Vital Signs BP (!) 135/91 (BP Location: Left Arm)   Pulse 65   Temp 98.4 F (36.9 C)   Resp 16   Wt 84.2 kg   SpO2 93%   BMI 29.07 kg/m  Physical Exam Vitals and nursing note reviewed.   Gen: NAD Eyes: PERRL, EOMI HEENT: no oropharyngeal swelling Neck: trachea midline Resp: clear to auscultation bilaterally Card: RRR, no murmurs, rubs, or gallops Abd: nontender, nondistended Extremities: no calf tenderness, no edema Vascular: 2+ radial pulses bilaterally, 2+ DP pulses bilaterally Neuro: Oriented to self couple times right-sided deficits noted which are chronic per patient, please see NIH stroke scale from stroke team on initial arrival as the patient's symptoms have improved with dextrose demonstration regulation Skin: no rashes Psyc: acting appropriately   ED Results / Procedures / Treatments   Labs (all labs ordered are listed, but only abnormal results are displayed) Labs Reviewed  CBC - Abnormal; Notable for the following components:      Result Value   WBC 13.4 (*)    Hemoglobin 11.3 (*)    RDW 16.7 (*)    All other components within normal limits  DIFFERENTIAL - Abnormal; Notable for the following components:   Lymphs Abs 5.6 (*)    All other components within normal limits  COMPREHENSIVE METABOLIC PANEL WITH GFR - Abnormal; Notable for the following components:   CO2 19 (*)    BUN 27 (*)    Creatinine, Ser 1.88 (*)    Albumin 3.2 (*)    AST 14 (*)    GFR, Estimated 34 (*)    All other  components within normal limits  URINALYSIS, ROUTINE W REFLEX MICROSCOPIC - Abnormal; Notable for the following components:   Color, Urine STRAW (*)    Protein, ur 100 (*)    Bacteria, UA RARE (*)    All other components within normal limits  I-STAT CHEM 8, ED - Abnormal; Notable for the following components:   Chloride 113 (*)    BUN 27 (*)    Creatinine, Ser 2.10 (*)    Calcium, Ion 1.08 (*)    TCO2 20 (*)    All other components within normal limits  CBG MONITORING, ED - Abnormal; Notable for the following components:   Glucose-Capillary 36 (*)    All other components within normal limits  CBG MONITORING, ED - Abnormal; Notable for the following components:   Glucose-Capillary 179 (*)    All other components within normal limits  PROTIME-INR  APTT  ETHANOL  HCG, SERUM, QUALITATIVE  C-PEPTIDE  CBG MONITORING, ED    EKG EKG Interpretation Date/Time:  Sunday April 16 2023 18:55:00 EDT Ventricular Rate:  68 PR Interval:  224 QRS Duration:  90 QT Interval:  410 QTC Calculation: 436 R Axis:   21  Text Interpretation: Sinus rhythm Prolonged PR interval RSR' in V1 or V2, probably normal variant Confirmed by Abner Hoffman [phone]) on 04/16/2023 7:12:02 PM  Radiology DG Chest Portable 1 View Result Date: 04/16/2023 CLINICAL DATA:  Hypoglycemia EXAM: PORTABLE CHEST - 1 VIEW COMPARISON:  11/12/2021 FINDINGS: Lungs are clear. Heart size and mediastinal contours are within normal limits. No effusion. Visualized bones unremarkable. IMPRESSION: No acute cardiopulmonary disease. Electronically Signed   By: Nicoletta Barrier M.D.   On: 04/16/2023 19:53   CT HEAD CODE STROKE WO CONTRAST Result Date: 04/16/2023 CLINICAL DATA:  Code stroke. Neuro deficit, acute, stroke suspected. EXAM: CT HEAD WITHOUT CONTRAST TECHNIQUE: Contiguous axial images were obtained from the base of the skull through the vertex without intravenous contrast. RADIATION DOSE REDUCTION: This exam was performed according to the  departmental dose-optimization program which includes automated exposure control, adjustment of the mA and/or kV according to patient size and/or use of iterative reconstruction technique. COMPARISON:  Head CT 04/14/2023 and MRI 04/15/2023 FINDINGS: Brain: There is no evidence of an acute infarct, intracranial hemorrhage, mass, midline shift, or extra-axial fluid collection. A large region of encephalomalacia is again noted anteriorly in the left frontal lobe involving the MCA and ACA territories with ex vacuo dilatation of the frontal horn of the left lateral ventricle. There are additional unchanged chronic infarcts in the right basal ganglia, left periatrial white matter, and left cerebellum with ex vacuo dilatation of the right frontal horn and left atrium. Multiple coarse calcifications are again noted in the brainstem. Vascular: No hyperdense vessel. Skull: No acute fracture or suspicious lesion. Hyperostosis, asymmetrically prominent  along the left frontal skull. Sinuses/Orbits: Unremarkable orbits. Visualized paranasal sinuses and mastoid air cells are clear. Other: None. ASPECTS ([place] Stroke Program Early CT Score) - Ganglionic level infarction (caudate, lentiform nuclei, internal capsule, insula, M1-M3 cortex): 7 - Supraganglionic infarction (M4-M6 cortex): 3 Total score (0-10 with 10 being normal): 10 These results were communicated to Dr. Alecia Ames at 6:55 pm on 04/16/2023 by text page via the [facility] messaging system. IMPRESSION: 1. No evidence of acute intracranial abnormality. ASPECTS of 10. 2. Unchanged chronic infarcts as above. Electronically Signed   By: Aundra Lee M.D.   On: 04/16/2023 18:55   MR BRAIN WO CONTRAST Result Date: 04/15/2023 CLINICAL DATA:  Initial evaluation for acute neuro deficit, stroke suspected. EXAM: MRI HEAD WITHOUT CONTRAST TECHNIQUE: Multiplanar, multiecho pulse sequences of the brain and surrounding structures were obtained without intravenous contrast. COMPARISON:   Prior CT from 04/14/2023. FINDINGS: Brain: Examination moderately degraded by motion artifact. Cerebral volume within normal limits. Patchy T2/FLAIR hyperintensity involving the periventricular and deep white matter both cerebral hemispheres, consistent with chronic small vessel ischemic disease. Encephalomalacia and gliosis involving the anterior left frontal lobe, likely related to prior infarct, left ACA and MCA distribution. Additional remote infarcts involving the right basal ganglia and left periatrial region. Small remote left cerebellar infarct. No convincing foci of restricted diffusion to suggest acute or subacute ischemia. Note made of apparent vague diffusion signal abnormality involving the right periatrial region on axial DWI sequence (series 2, image 27), felt to be consistent with artifact. Gray-white matter differentiation maintained. No acute intracranial hemorrhage. Minor chronic hemosiderin staining noted at the anterior left frontal region. No other significant chronic intracranial blood products. No mass lesion, midline shift or mass effect. No hydrocephalus or extra-axial fluid collection. Pituitary gland within normal limits. Vascular: Attenuated flow void within the left MCA distribution, in keeping with the chronic left MCA territory infarct. Major intracranial vascular flow voids are otherwise grossly maintained on this motion degraded exam. Skull and upper cervical spine: Craniocervical junction grossly within normal limits. Bone marrow signal intensity grossly normal. No scalp soft tissue abnormality. Sinuses/Orbits: Globes orbital soft tissues within normal limits. Paranasal sinuses are clear. No significant mastoid effusion. Other: None. IMPRESSION: 1. Motion degraded exam. 2. No acute intracranial abnormality. 3. Multiple chronic infarcts involving the left ACA/MCA distribution, right basal ganglia, left periatrial region, and left cerebellum. 4. Underlying chronic microvascular  ischemic disease. Electronically Signed   By: Virgia Griffins M.D.   On: 04/15/2023 02:53   CT HEAD CODE STROKE WO CONTRAST Result Date: 04/14/2023 CLINICAL DATA:  Code stroke. EXAM: CT HEAD WITHOUT CONTRAST TECHNIQUE: Contiguous axial images were obtained from the base of the skull through the vertex without intravenous contrast. RADIATION DOSE REDUCTION: This exam was performed according to the departmental dose-optimization program which includes automated exposure control, adjustment of the mA and/or kV according to patient size and/or use of iterative reconstruction technique. COMPARISON:  Prior study from 07/06/2022. FINDINGS: Brain: Chronic encephalomalacia and gliosis involving the anterior left frontal lobe noted. Chronic ischemic infarcts at the right basal ganglia and left periatrial region. Few dystrophic calcifications present about the brainstem. Appearance is stable from prior. No acute intracranial hemorrhage. No acute large vessel territory infarct. No mass lesion or midline shift. No hydrocephalus or extra-axial fluid collection. Vascular: No abnormal hyperdense vessel. Skull: Scalp soft tissues demonstrate no acute finding. Calvarium intact. Sinuses/Orbits: Left gaze noted. Paranasal sinuses are clear. No mastoid effusion. Other: None. ASPECTS [facility] Stroke Program Early CT Score) -  Ganglionic level infarction (caudate, lentiform nuclei, internal capsule, insula, M1-M3 cortex): 7 - Supraganglionic infarction (M4-M6 cortex): 3 Total score (0-10 with 10 being normal): 10 IMPRESSION: 1. Stable head CT.  No acute intracranial abnormality. 2. ASPECTS is 10. 3. Multiple remote infarcts chronic ischemic changes with multiple remote infarcts involving the left greater than right cerebral hemispheres. These results were communicated to Dr. Murvin Arthurs at 11:33 pm on 04/14/2023 by text page via the [facility] messaging system. Electronically Signed   By: Virgia Griffins M.D.   On: 04/14/2023 23:36     Procedures Procedures    Medications Ordered in ED Medications  sodium chloride flush (NS) 0.9 % injection 3 mL (3 mLs Intravenous Given 04/16/23 1855)  dextrose 50 % solution 50 mL (50 mLs Intravenous Given 04/16/23 1835)    ED Course/ Medical Decision Making/ A&P                                 Medical Decision Making 38 year old female with past medical history of CVA with right-sided deficits as well as hypertension presenting to the emergency department today with strokelike symptoms found to be hypoglycemic for the second time this week.  I did discuss patient's case with Dr. Alecia Ames.  She is back to her baseline now so he recommends against a repeat MRI.  Recommends further evaluation for her hyperglycemia as she is not diabetic and not on medications that should cause this.  The patient's work appears reassuring.  Glucose remained relatively stable.  Given her recurrent episodes of calls placed to the hospitalist service for admission.  CRITICAL CARE Performed by: Carin Charleston   Total critical care time: 35 minutes  Critical care time was exclusive of separately billable procedures and treating other patients.  Critical care was necessary to treat or prevent imminent or life-threatening deterioration.  Critical care was time spent personally by me on the following activities: development of treatment plan with patient and/or surrogate as well as nursing, discussions with consultants, evaluation of patient's response to treatment, examination of patient, obtaining history from patient or surrogate, ordering and performing treatments and interventions, ordering and review of laboratory studies, ordering and review of radiographic studies, pulse oximetry and re-evaluation of patient's condition.   Amount and/or Complexity of Data Reviewed Labs: ordered. Radiology: ordered.  Risk Prescription drug management. Decision regarding  hospitalization.           Final Clinical Impression(s) / ED Diagnoses Final diagnoses:  Hypoglycemia    Rx / DC Orders ED Discharge Orders     None         Carin Charleston, MD 04/16/23 2203    Carin Charleston, MD 04/16/23 2245

## 2023-04-16 NOTE — ED Notes (Signed)
 Assumed care on patient , patient resting with no distress, respirations unlabored , IV site intact .

## 2023-04-17 DIAGNOSIS — E162 Hypoglycemia, unspecified: Secondary | ICD-10-CM | POA: Diagnosis not present

## 2023-04-17 LAB — GLUCOSE, CAPILLARY
Glucose-Capillary: 126 mg/dL — ABNORMAL HIGH (ref 70–99)
Glucose-Capillary: 132 mg/dL — ABNORMAL HIGH (ref 70–99)
Glucose-Capillary: 134 mg/dL — ABNORMAL HIGH (ref 70–99)
Glucose-Capillary: 146 mg/dL — ABNORMAL HIGH (ref 70–99)
Glucose-Capillary: 171 mg/dL — ABNORMAL HIGH (ref 70–99)
Glucose-Capillary: 52 mg/dL — ABNORMAL LOW (ref 70–99)
Glucose-Capillary: 55 mg/dL — ABNORMAL LOW (ref 70–99)
Glucose-Capillary: 56 mg/dL — ABNORMAL LOW (ref 70–99)
Glucose-Capillary: 62 mg/dL — ABNORMAL LOW (ref 70–99)
Glucose-Capillary: 68 mg/dL — ABNORMAL LOW (ref 70–99)
Glucose-Capillary: 72 mg/dL (ref 70–99)
Glucose-Capillary: 74 mg/dL (ref 70–99)
Glucose-Capillary: 77 mg/dL (ref 70–99)
Glucose-Capillary: 94 mg/dL (ref 70–99)
Glucose-Capillary: 97 mg/dL (ref 70–99)
Glucose-Capillary: 97 mg/dL (ref 70–99)
Glucose-Capillary: 98 mg/dL (ref 70–99)

## 2023-04-17 LAB — CBC
HCT: 36.4 % (ref 36.0–46.0)
Hemoglobin: 11.6 g/dL — ABNORMAL LOW (ref 12.0–15.0)
MCH: 27.1 pg (ref 26.0–34.0)
MCHC: 31.9 g/dL (ref 30.0–36.0)
MCV: 85 fL (ref 80.0–100.0)
Platelets: 282 10*3/uL (ref 150–400)
RBC: 4.28 MIL/uL (ref 3.87–5.11)
RDW: 16.4 % — ABNORMAL HIGH (ref 11.5–15.5)
WBC: 10.6 10*3/uL — ABNORMAL HIGH (ref 4.0–10.5)
nRBC: 0 % (ref 0.0–0.2)

## 2023-04-17 LAB — CORTISOL-AM, BLOOD: Cortisol - AM: 12 ug/dL (ref 6.7–22.6)

## 2023-04-17 LAB — BASIC METABOLIC PANEL WITH GFR
Anion gap: 11 (ref 5–15)
BUN: 22 mg/dL — ABNORMAL HIGH (ref 6–20)
CO2: 18 mmol/L — ABNORMAL LOW (ref 22–32)
Calcium: 8.8 mg/dL — ABNORMAL LOW (ref 8.9–10.3)
Chloride: 109 mmol/L (ref 98–111)
Creatinine, Ser: 1.59 mg/dL — ABNORMAL HIGH (ref 0.44–1.00)
GFR, Estimated: 42 mL/min — ABNORMAL LOW (ref >60)
Glucose, Bld: 78 mg/dL (ref 70–99)
Potassium: 4.7 mmol/L (ref 3.5–5.1)
Sodium: 138 mmol/L (ref 135–145)

## 2023-04-17 LAB — HIV ANTIBODY (ROUTINE TESTING W REFLEX): HIV Screen 4th Generation wRfx: NONREACTIVE

## 2023-04-17 MED ORDER — DEXTROSE 50 % IV SOLN
1.0000 | INTRAVENOUS | Status: DC | PRN
  Administered 2023-04-17: 50 mL via INTRAVENOUS
  Filled 2023-04-17: qty 50

## 2023-04-17 MED ORDER — DEXTROSE 50 % IV SOLN
12.5000 g | INTRAVENOUS | Status: AC
  Administered 2023-04-17: 12.5 g via INTRAVENOUS
  Filled 2023-04-17: qty 50

## 2023-04-17 MED ORDER — DEXTROSE 50 % IV SOLN
INTRAVENOUS | Status: AC
  Administered 2023-04-17: 50 mL
  Filled 2023-04-17: qty 50

## 2023-04-17 MED ORDER — DEXTROSE 10 % IV SOLN
INTRAVENOUS | Status: DC
  Filled 2023-04-17: qty 1000

## 2023-04-17 NOTE — Plan of Care (Signed)

## 2023-04-17 NOTE — Progress Notes (Signed)
 Progress Note   Patient: Stacey [name] GNF:[id number] DOB: [birth year] DOA: 04/16/2023     0 DOS: the patient was seen and examined on 04/17/2023   Brief hospital course: [name] is a 38 y.o. 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  Initially patient arrived as code stroke and was evaluated by neurology, code stroke was canceled after presentation was felt due to hypoglycemia.  Prior to discharge home patient developed recurrent symptoms and hypoglycemia with CBG 48.  Patient was given second amp of D50 and started on D5-NS maintenance fluids.  The hospitalist service was consulted to admit.   Assessment and Plan: Recurrent symptomatic hypoglycemia: Unclear etiology. Mother reports patient has a good appetite. No offending medications identified. Denies surreptitious insulin use. Was started on D5-NS on admission, but continues to have episodes of hypoglycemia. Cortisol level within normal limits. - D10 infusion. - Follow-up insulin like growth factor, random insulin, C-peptide. - Follow-up sulfonylurea hypoglycemic panel. - Encourage oral intake - CBG checks every 2 hours    History of CVA with right-sided weakness and aphasia: Presented with worsening of her baseline residual stroke symptoms in setting of hypoglycemia. - Continue aspirin, Plavix, atorvastatin   CKD stage IIIb: Renal function stable.   -Continue sodium bicarb tablets.   Hypertension: -Continue Lopressor and ramipril.   Normocytic anemia: Hemoglobin stable at baseline.    Hyperlipidemia: -Continue atorvastatin.   Depression/anxiety: -Continue sertraline and Abilify.  Hyperparathyroidism - Continue outpatient follow up.        Subjective: Patient is feeling well. Denies any abdominal pain. Per mother, she has a good  appetite  Physical Exam: Vitals:   04/17/23 0022 04/17/23 0553 04/17/23 0826 04/17/23 1147  BP: (!) 152/76 (!) 127/104 110/88 (!) 132/102  Pulse: 66 68 74 60  Resp: 18 18 18 18   Temp: 98.2 F (36.8 C) 98.4 F (36.9 C) 98.5 F (36.9 C) 98.6 F (37 C)  TempSrc: Oral Oral Oral Oral  SpO2: 100% 100% 95% 100%  Weight:         General: Alert, oriented X3  Eyes: Pupils equal, reactive  Oral cavity: moist mucous membranes  Head: Atraumatic, normocephalic  Neck: supple  Chest: clear to auscultation. No crackles, no wheezes  CVS: S1,S2 RRR. No murmurs  Abd: No distention, soft, non-tender. No masses palpable  Extr: No edema   MSK: No joint deformities or swelling  Neurological: Right sided weakness, some aphasia   Data Reviewed:      Latest Ref Rng & Units 04/17/2023    5:52 AM 04/16/2023    6:45 PM 04/16/2023    6:35 PM  CBC  WBC 4.0 - 10.5 K/uL 10.6   13.4   Hemoglobin 12.0 - 15.0 g/dL 84.6  96.2  95.2   Hematocrit 36.0 - 46.0 % 36.4  37.0  36.8   Platelets 150 - 400 K/uL 282   296       Latest Ref Rng & Units 04/17/2023    5:52 AM 04/16/2023    6:45 PM 04/16/2023    6:35 PM  BMP  Glucose 70 - 99 mg/dL 78  83  84   BUN 6 - 20 mg/dL 22  27  27    Creatinine 0.44 - 1.00 mg/dL 8.41  3.24  4.01   Sodium 135 - 145 mmol/L 138  141  138   Potassium 3.5 -  5.1 mmol/L 4.7  5.0  4.9   Chloride 98 - 111 mmol/L 109  113  111   CO2 22 - 32 mmol/L 18   19   Calcium 8.9 - 10.3 mg/dL 8.8   9.0      Family Communication: Spoke with mother at bedside  Disposition: Status is: Observation The patient will require care spanning > 2 midnights and should be moved to inpatient because: She is requiring a continuous dextrose infusion due to persistent hypoglycemia.   Planned Discharge Destination: Home  DVT ppx: SQ heparin     Time spent: 45 minutes  Author: MDALA-GAUSI, Skyylar Kopf AGATHA, MD 04/17/2023 2:28 PM  For on call review [email or web address].

## 2023-04-17 NOTE — TOC Initial Note (Signed)
 Transition of Care [facility]) - Initial/Assessment Note    Patient Details  Name: Stacey [name] MRN: [id number] Date of Birth: Aug 26, 1985  [facility] CM/SW Contact:    Harriet Masson, RN Phone Number: 04/17/2023, 10:24 AM  Clinical Narrative:                 Spoke to patient and mother, Okey Regal, at bedside regarding transition needs.  Patient lives with mother and has had OP PT,OT at [facility] on [address].  Patient uses cane. Patient is hypoglycemic this morning.   PCP verified. PT, OT consult added.  TOC will follow for recommendations.   Expected Discharge Plan: OP Rehab Barriers to Discharge: Continued Medical Work up   Patient Goals and CMS Choice Patient states their goals for this hospitalization and ongoing recovery are:: return home with Mother          Expected Discharge Plan and Services   Discharge Planning Services: CM Consult   Living arrangements for the past 2 months: Apartment                                      Prior Living Arrangements/Services Living arrangements for the past 2 months: Apartment Lives with:: Parents Patient language and need for interpreter reviewed:: Yes Do you feel safe going back to the place where you live?: Yes      Need for Family Participation in Patient Care: Yes (Comment) Care giver support system in place?: Yes (comment) Current home services: DME (cane) Criminal Activity/Legal Involvement Pertinent to Current Situation/Hospitalization: No - Comment as needed  Activities of Daily Living      Permission Sought/Granted                  Emotional Assessment       Orientation: : Fluctuating Orientation (Suspected and/or reported Sundowners) Alcohol / Substance Use: Not Applicable Psych Involvement: No (comment)  Admission diagnosis:  Hypoglycemia [E16.2] Patient Active Problem List   Diagnosis Date Noted   Hypoglycemia 04/16/2023   History of CVA with residual deficit  04/16/2023   Chronic kidney disease, stage 3b (HCC) 04/16/2023   Depression 11/30/2021   Mild cognitive impairment 11/19/2021   Anemia due to stage 3b chronic kidney disease (HCC) 09/15/2021   Persistent proteinuria 09/15/2021   Anxiety 07/07/2021   GAD (generalized anxiety disorder) 04/13/2021   MDD (major depressive disorder), recurrent episode (HCC) 04/13/2021   CVA (cerebrovascular accident) (HCC) 04/24/2017   Genital herpes 04/24/2017   Hyperlipidemia, unspecified 04/24/2017   Hypertension 04/24/2017   Migraine headache 04/24/2017   Prediabetes 10/24/2014   Chronic kidney disease, stage II (mild) 10/09/2014   Absence of bladder continence 11/18/2013   Eczema 08/22/2012   Contraception 08/25/2010   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   Anemia 03/28/2009   Hypokalemia 03/13/2009   Hypernatremia 03/12/2009   Acute renal failure (HCC) 03/11/2009   Right hemiparesis (HCC) 01/29/2009   Thrombophilia (HCC) 01/29/2009   Acute blood loss anemia 10/23/2008   Vaginal bleeding, abnormal 10/22/2008   Hypercoagulable state (HCC) 09/23/2008   Personal history of transient ischemic attack (TIA) and cerebral infarction without residual deficit 07/30/2008   PCP:  Frederic Jericho, PA-C Pharmacy:   [id number] [place], [place] - [address] AT [facility] & [address] [place] [address] Phone: [phone] Fax: [phone]  Social Drivers of Health (SDOH) Social History: SDOH Screenings   Food Insecurity: Low Risk  (12/14/2021)   Received from [facility] [employer], [facility]  Housing: Low Risk  (12/14/2021)   Received from [facility], [facility]  Transportation Needs: No Transportation Needs (12/14/2021)   Received from [facility] visits prior to 03/05/2022., [facility] visits prior to 03/05/2022.  Utilities: Low Risk  (12/14/2021)   Received from [facility],  [facility] visits prior to 03/05/2022., [facility] visits prior to 03/05/2022., [facility]  Alcohol Screen: Low Risk  (03/22/2022)  Depression (PHQ2-9): High Risk (04/04/2023)  Tobacco Use: Low Risk  (04/16/2023)   SDOH Interventions:     Readmission Risk Interventions     No data to display

## 2023-04-17 NOTE — ED Notes (Signed)
 5 Central notified on patient's transport to 5C Rm. 9.

## 2023-04-17 NOTE — Inpatient Diabetes Management (Signed)
 Inpatient Diabetes Program Recommendations  AACE/ADA: New Consensus Statement on Inpatient Glycemic Control (2015)  Target Ranges:  Prepandial:   less than 140 mg/dL      Peak postprandial:   less than 180 mg/dL (1-2 hours)      Critically ill patients:  140 - 180 mg/dL   Lab Results  Component Value Date   GLUCAP 97 04/17/2023    Review of Glycemic Control  Latest Reference Range & Units 04/16/23 22:34 04/16/23 23:21 04/16/23 23:28 04/17/23 01:07 04/17/23 01:30 04/17/23 01:59 04/17/23 04:37 04/17/23 06:34 04/17/23 08:21 04/17/23 09:45 04/17/23 10:16 04/17/23 11:25  Glucose-Capillary 70 - 99 mg/dL 48 (L) 657 (H) 846 (H) 52 (L) 56 (L) 146 (H) 72 98 74 55 (L) 132 (H) 97   Diabetes history: None/hypoglycemia  Current orders for Inpatient glycemic control: None D5 NS 40 ml/hour  Inpatient Diabetes Program Recommendations:    -   consider insulin level to see if its elevated  Cortisol level normal  Will watch inpatient trends  Thanks, Eloise Hake RN, MSN, BC-ADM Inpatient Diabetes Coordinator Team Pager [phone] (8a-5p)

## 2023-04-18 ENCOUNTER — Encounter: Payer: MEDICAID | Admitting: Occupational Therapy

## 2023-04-18 DIAGNOSIS — E16A2 Hypoglycemia level 2: Secondary | ICD-10-CM | POA: Diagnosis present

## 2023-04-18 DIAGNOSIS — E785 Hyperlipidemia, unspecified: Secondary | ICD-10-CM | POA: Diagnosis present

## 2023-04-18 DIAGNOSIS — I69320 Aphasia following cerebral infarction: Secondary | ICD-10-CM | POA: Diagnosis not present

## 2023-04-18 DIAGNOSIS — I69351 Hemiplegia and hemiparesis following cerebral infarction affecting right dominant side: Secondary | ICD-10-CM | POA: Diagnosis not present

## 2023-04-18 DIAGNOSIS — F32A Depression, unspecified: Secondary | ICD-10-CM | POA: Diagnosis present

## 2023-04-18 DIAGNOSIS — N1832 Chronic kidney disease, stage 3b: Secondary | ICD-10-CM | POA: Diagnosis present

## 2023-04-18 DIAGNOSIS — Z7902 Long term (current) use of antithrombotics/antiplatelets: Secondary | ICD-10-CM | POA: Diagnosis not present

## 2023-04-18 DIAGNOSIS — N179 Acute kidney failure, unspecified: Secondary | ICD-10-CM | POA: Diagnosis present

## 2023-04-18 DIAGNOSIS — F419 Anxiety disorder, unspecified: Secondary | ICD-10-CM | POA: Diagnosis present

## 2023-04-18 DIAGNOSIS — Z79899 Other long term (current) drug therapy: Secondary | ICD-10-CM | POA: Diagnosis not present

## 2023-04-18 DIAGNOSIS — R531 Weakness: Secondary | ICD-10-CM | POA: Diagnosis present

## 2023-04-18 DIAGNOSIS — E213 Hyperparathyroidism, unspecified: Secondary | ICD-10-CM | POA: Diagnosis present

## 2023-04-18 DIAGNOSIS — R29810 Facial weakness: Secondary | ICD-10-CM | POA: Diagnosis present

## 2023-04-18 DIAGNOSIS — Z888 Allergy status to other drugs, medicaments and biological substances status: Secondary | ICD-10-CM | POA: Diagnosis not present

## 2023-04-18 DIAGNOSIS — E161 Other hypoglycemia: Secondary | ICD-10-CM | POA: Diagnosis present

## 2023-04-18 DIAGNOSIS — E162 Hypoglycemia, unspecified: Secondary | ICD-10-CM | POA: Diagnosis present

## 2023-04-18 DIAGNOSIS — D631 Anemia in chronic kidney disease: Secondary | ICD-10-CM | POA: Diagnosis present

## 2023-04-18 DIAGNOSIS — Z7982 Long term (current) use of aspirin: Secondary | ICD-10-CM | POA: Diagnosis not present

## 2023-04-18 DIAGNOSIS — I129 Hypertensive chronic kidney disease with stage 1 through stage 4 chronic kidney disease, or unspecified chronic kidney disease: Secondary | ICD-10-CM | POA: Diagnosis present

## 2023-04-18 LAB — GLUCOSE, CAPILLARY
Glucose-Capillary: 132 mg/dL — ABNORMAL HIGH (ref 70–99)
Glucose-Capillary: 107 mg/dL — ABNORMAL HIGH (ref 70–99)
Glucose-Capillary: 82 mg/dL (ref 70–99)
Glucose-Capillary: 91 mg/dL (ref 70–99)
Glucose-Capillary: 78 mg/dL (ref 70–99)
Glucose-Capillary: 146 mg/dL — ABNORMAL HIGH (ref 70–99)
Glucose-Capillary: 71 mg/dL (ref 70–99)
Glucose-Capillary: 110 mg/dL — ABNORMAL HIGH (ref 70–99)
Glucose-Capillary: 116 mg/dL — ABNORMAL HIGH (ref 70–99)

## 2023-04-18 LAB — BASIC METABOLIC PANEL WITH GFR
Anion gap: 12 (ref 5–15)
BUN: 16 mg/dL (ref 6–20)
CO2: 18 mmol/L — ABNORMAL LOW (ref 22–32)
Calcium: 9 mg/dL (ref 8.9–10.3)
Chloride: 106 mmol/L (ref 98–111)
Creatinine, Ser: 1.57 mg/dL — ABNORMAL HIGH (ref 0.44–1.00)
GFR, Estimated: 43 mL/min — ABNORMAL LOW (ref >60)
Glucose, Bld: 87 mg/dL (ref 70–99)
Potassium: 4.3 mmol/L (ref 3.5–5.1)
Sodium: 136 mmol/L (ref 135–145)

## 2023-04-18 LAB — INSULIN, RANDOM: Insulin: 225 u[IU]/mL — ABNORMAL HIGH (ref 2.6–24.9)

## 2023-04-18 LAB — C-PEPTIDE: C-Peptide: 13.2 ng/mL — ABNORMAL HIGH (ref 1.1–4.4)

## 2023-04-18 LAB — INSULIN-LIKE GROWTH FACTOR: Somatomedin C: 271 ng/mL — ABNORMAL HIGH (ref 79–259)

## 2023-04-18 NOTE — Progress Notes (Signed)
 Progress Note   Patient: Stacey [name] ZOX:[id number] DOB: 1985/08/29 DOA: 04/16/2023     0 DOS: the patient was seen and examined on 04/18/2023   Brief hospital course: Stacey [name] is a 38 y.o. 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  Initially patient arrived as code stroke and was evaluated by neurology, code stroke was canceled after presentation was felt due to hypoglycemia.  Prior to discharge home patient developed recurrent symptoms and hypoglycemia with CBG 48.  Patient was given second amp of D50 and started on D5-NS maintenance fluids.  The hospitalist service was consulted to admit.  Patient remains in the hospital due to hypoglycemia, unknown cause.  Assessment and Plan: Recurrent symptomatic hypoglycemia: Etiology is unclear.  Patient with good appetite.  No previous history of hypoglycemia. Not on any medications or offending agents.  Denies any insulin use, denies any herbal or over-the-counter medications. Was started on D5-NS on admission, but continues to have episodes of hypoglycemia.  Today on 10% dextrose 100 mL/h.  Blood sugars are adequate today. Encourage oral intake, frequent snacking and carb intake. Decrease dextrose infusion to 50 mL/h and monitor. Verify low capillary sugars with serum glucose if patient is hemodynamically stable. Cortisol, normal. C-peptide, elevated. Insulin, growth factor, sulfonylurea panels are pending. Continue monitoring blood sugars every 2 hours.   History of CVA with right-sided weakness and aphasia: Presented with worsening of her baseline residual stroke symptoms in setting of hypoglycemia. - Continue aspirin, Plavix, atorvastatin.  No evidence of new stroke.   CKD stage IIIb: Renal function stable.   -Continue sodium bicarb tablets.    Hypertension: -Continue Lopressor and ramipril.   Normocytic anemia: Hemoglobin stable at baseline.    Hyperlipidemia: -Continue atorvastatin.   Depression/anxiety: -Continue sertraline and Abilify.  Hyperparathyroidism - Continue outpatient follow up.        Subjective: Patient seen and examined.  Mother at the bedside.  Patient answers yes or no.  She looked very pleasant.  I was called back by the nursing staff because mother thought patient is more confused than usual.  She was attempting to work with PT OT.  She will just answer yes or no.  Did not show any evidence of impulsiveness.  No other overnight events.  Physical Exam: Vitals:   04/17/23 1649 04/17/23 2014 04/18/23 0357 04/18/23 0823  BP: 101/75 (!) 141/63 (!) 158/91 (!) 112/99  Pulse: 73 68 60 98  Resp: 18 16 18 18   Temp: 98.8 F (37.1 C) 98.7 F (37.1 C) 98 F (36.7 C) 98 F (36.7 C)  TempSrc: Oral Oral  Oral  SpO2: 100% 100% 93% 91%  Weight:         General: Alert, awake.  Difficult to evaluate for orientation questions because of aphasia.  But looks comfortable and pleasant on interaction. Eyes: Pupils equal, reactive  Oral cavity: moist mucous membranes  Head: Atraumatic, normocephalic  Neck: supple  Chest: clear to auscultation. No crackles, no wheezes  CVS: S1,S2 RRR. No murmurs  Abd: No distention, soft, non-tender. No masses palpable  Extr: No edema   MSK: No joint deformities or swelling  Neurological: Right sided hemiplegia.  Aphasic.   Data Reviewed:      Latest Ref Rng & Units 04/17/2023    5:52 AM 04/16/2023    6:45 PM 04/16/2023    6:35 PM  CBC  WBC 4.0 - 10.5 K/uL 10.6   13.4   Hemoglobin 12.0 - 15.0 g/dL 13.2  44.0  10.2   Hematocrit 36.0 - 46.0 % 36.4  37.0  36.8   Platelets 150 - 400 K/uL 282   296       Latest Ref Rng & Units 04/18/2023    6:00 AM 04/17/2023    5:52 AM 04/16/2023    6:45 PM  BMP  Glucose 70 - 99 mg/dL 87  78  83   BUN 6 - 20 mg/dL 16  22  27     Creatinine 0.44 - 1.00 mg/dL 7.25  3.66  4.40   Sodium 135 - 145 mmol/L 136  138  141   Potassium 3.5 - 5.1 mmol/L 4.3  4.7  5.0   Chloride 98 - 111 mmol/L 106  109  113   CO2 22 - 32 mmol/L 18  18    Calcium 8.9 - 10.3 mg/dL 9.0  8.8       Family Communication: Mother at the side.  Disposition: Status is: Observation The patient will require care spanning > 2 midnights and should be moved to inpatient because: She is requiring a continuous dextrose infusion due to persistent hypoglycemia.   Planned Discharge Destination: Home  DVT ppx: SQ heparin     Time spent: 52 minutes Author: Vada Garibaldi, MD 04/18/2023 10:53 AM  For on call review [email or web address].

## 2023-04-18 NOTE — Evaluation (Signed)
 Occupational Therapy Evaluation Patient Details Name: Stacey [name] MRN: [id number] DOB: 28-Nov-1985 Today's Date: 04/18/2023   History of Present Illness   Pt is a 38 y.o. female admitted via ems as code stroke 4/13. MRI showed no acute changes, multiple chronic infarcts of L ACA/MCA. Pt found to be hypoglycemic PMH: CVA w/ residual R sided weakness & aphasia, HTN, DM, CKD     Clinical Impressions Pt admitted based on above, and was seen based on problem list below. PTA pt was receiving min to total assist with ADLs. Today pt is requiring min  to total +2 assist for ADLs. Bed mobility and functional transfers are  min  +2 for safety. Recommendation of HHOT to optimize independence levels. OT will continue to follow acutely to maximize functional independence.        If plan is discharge home, recommend the following:   A lot of help with walking and/or transfers;A lot of help with bathing/dressing/bathroom     Functional Status Assessment   Patient has had a recent decline in their functional status and demonstrates the ability to make significant improvements in function in a reasonable and predictable amount of time.     Equipment Recommendations   None recommended by OT (All DME needs met)     Recommendations for Other Services         Precautions/Restrictions   Precautions Precautions: Fall Recall of Precautions/Restrictions: Impaired Restrictions Weight Bearing Restrictions Per Provider Order: No     Mobility Bed Mobility Overal bed mobility: Needs Assistance Bed Mobility: Supine to Sit, Sit to Supine     Supine to sit: Min assist Sit to supine: Contact guard assist   General bed mobility comments: Assist with trunk for supine to sit, to return to bed pt showed good mobility, and able to position self with cueing    Transfers Overall transfer level: Needs assistance Equipment used: Quad cane Transfers: Sit to/from Stand Sit to Stand: Min  assist, +2 safety/equipment           General transfer comment: Min +2 for safety, unable to take side steps towards [facility]      Balance Overall balance assessment: Needs assistance, History of Falls Sitting-balance support: Single extremity supported, Feet supported Sitting balance-Leahy Scale: Fair   Postural control: Posterior lean Standing balance support: Single extremity supported, During functional activity, Reliant on assistive device for balance Standing balance-Leahy Scale: Poor Standing balance comment: Reliant on cane and hand held assist     ADL either performed or assessed with clinical judgement   ADL Overall ADL's : Needs assistance/impaired Eating/Feeding: Minimal assistance;Sitting   Grooming: Minimal assistance;Sitting           Upper Body Dressing : Moderate assistance Upper Body Dressing Details (indicate cue type and reason): Assist to thread RUE Lower Body Dressing: Total assistance;Sit to/from stand       Toileting- Architect and Hygiene: Total assistance;+2 for safety/equipment;Sit to/from stand Toileting - Clothing Manipulation Details (indicate cue type and reason): Pt incontinent, +2 for safety with standing balance       General ADL Comments: Pt's mother assist at baseline     Vision Baseline Vision/History: 1 Wears glasses Vision Assessment?: No apparent visual deficits            Pertinent Vitals/Pain Pain Assessment Pain Assessment: No/denies pain     Extremity/Trunk Assessment Upper Extremity Assessment Upper Extremity Assessment: RUE deficits/detail;Generalized weakness RUE Deficits / Details: Hx of previous CVA, residual R sided weakness, Flexion  contracture at elbow and fingers RUE Sensation: decreased light touch RUE Coordination: decreased fine motor;decreased gross motor   Lower Extremity Assessment Lower Extremity Assessment: Defer to PT evaluation   Cervical / Trunk Assessment Cervical / Trunk  Assessment: Normal   Communication Communication Communication: Impaired Factors Affecting Communication: Reduced clarity of speech;Difficulty expressing self (Hx of expressive aphasia)   Cognition Arousal: Alert Behavior During Therapy: WFL for tasks assessed/performed Cognition: History of cognitive impairments       OT - Cognition Comments: Hx of previous CVA, delayed processing speed       Following commands: Impaired Following commands impaired: Follows one step commands with increased time     Cueing  General Comments   Cueing Techniques: Verbal cues;Tactile cues  Mother present for session           Home Living Family/patient expects to be discharged to:: Private residence Living Arrangements: Parent Available Help at Discharge: Family;Available 24 hours/day Type of Home: Apartment Home Access: Stairs to enter Entrance Stairs-Number of Steps: 5 Entrance Stairs-Rails: Can reach both Home Layout: One level     Bathroom Shower/Tub: Walk-in shower;Sponge bathes at baseline   [employer]: Standard     Home Equipment: BSC/3in1;Shower seat;Cane - quad          Prior Functioning/Environment Prior Level of Function : Needs assist;History of Falls (last six months)         Mobility Comments: Uses cane inconsistently for mobility, frequent falls ADLs Comments: Incontient, mom assists with all ADLs    OT Problem List: Decreased strength;Decreased activity tolerance;Decreased range of motion;Impaired balance (sitting and/or standing);Decreased safety awareness;Decreased cognition;Decreased knowledge of use of DME or AE;Impaired UE functional use   OT Treatment/Interventions: Self-care/ADL training;Therapeutic exercise;Neuromuscular education;DME and/or AE instruction;Energy conservation;Therapeutic activities;Patient/family education;Balance training      OT Goals(Current goals can be found in the care plan section)   Acute Rehab OT Goals Patient  Stated Goal: Unclear d/t aphasia OT Goal Formulation: With patient/family Time For Goal Achievement: 05/02/23 Potential to Achieve Goals: Good   OT Frequency:  Min 2X/week    Co-evaluation PT/OT/SLP Co-Evaluation/Treatment: Yes Reason for Co-Treatment: For patient/therapist safety;To address functional/ADL transfers   OT goals addressed during session: ADL's and self-care;Proper use of Adaptive equipment and DME      AM-PAC OT "6 Clicks" Daily Activity     Outcome Measure Help from another person eating meals?: A Little Help from another person taking care of personal grooming?: A Little Help from another person toileting, which includes using toliet, bedpan, or urinal?: Total Help from another person bathing (including washing, rinsing, drying)?: A Lot Help from another person to put on and taking off regular upper body clothing?: A Lot Help from another person to put on and taking off regular lower body clothing?: Total 6 Click Score: 12   End of Session Equipment Utilized During Treatment: Other (comment) (Quad cane) Nurse Communication: Mobility status  Activity Tolerance: Patient tolerated treatment well Patient left: in bed;with call bell/phone within reach;with bed alarm set;with family/visitor present  OT Visit Diagnosis: Unsteadiness on feet (R26.81);Other abnormalities of gait and mobility (R26.89);Repeated falls (R29.6);Muscle weakness (generalized) (M62.81);Hemiplegia and hemiparesis Hemiplegia - Right/Left: Right Hemiplegia - dominant/non-dominant: Dominant Hemiplegia - caused by:  (Previous CVA)                Time: 7829-5621 OT Time Calculation (min): 19 min Charges:  OT General Charges $OT Visit: 1 Visit OT Evaluation $OT Eval Moderate Complexity: 1 Mod  Stacey [name], OT  Acute Rehabilitation Services Office [phone] Secure chat preferred   Stacey [name] 04/18/2023, 11:24 AM

## 2023-04-18 NOTE — Plan of Care (Signed)

## 2023-04-18 NOTE — Evaluation (Signed)
 Physical Therapy Evaluation Patient Details Name: Stacey [name] MRN: [id number] DOB: 08-Oct-1985 Today's Date: 04/18/2023  History of Present Illness  Pt is a 38 y.o. female admitted via ems as code stroke 4/13. MRI showed no acute changes, multiple chronic infarcts of L ACA/MCA. 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 Patient has history of falls. Using QC prior to admission. Patient required min A for bed mobility and min/Mod A for sit to stand and assistance to change wet gown and wet pull ups. Patient was unable to take any steps this session. She will continue to benefit from skilled PT to improve mobility, balance and independence.          If plan is discharge home, recommend the following: A lot of help with walking and/or transfers;A lot of help with bathing/dressing/bathroom;Assist for transportation;Help with stairs or ramp for entrance;Assistance with cooking/housework   Can travel by private vehicle    yes    Equipment Recommendations None recommended by PT  Recommendations for Other Services       Functional Status Assessment Patient has had a recent decline in their functional status and demonstrates the ability to make significant improvements in function in a reasonable and predictable amount of time.     Precautions / Restrictions Precautions Precautions: Fall Recall of Precautions/Restrictions: Impaired Restrictions Weight Bearing Restrictions Per Provider Order: No      Mobility  Bed Mobility Overal bed mobility: Needs Assistance Bed Mobility: Supine to Sit, Sit to Supine     Supine to sit: Min assist Sit to supine: Contact guard assist   General bed mobility comments: Assist with trunk for supine to sit, to return to bed pt showed good mobility, and able to  position self with cueing    Transfers Overall transfer level: Needs assistance Equipment used: Quad cane Transfers: Sit to/from Stand Sit to Stand: Min assist, Mod assist, +2 safety/equipment, +2 physical assistance           General transfer comment: Min +2 for safety, unable to take side steps towards [facility]    Ambulation/Gait               General Gait Details: unable to take any steps this session  Stairs            Wheelchair Mobility     Tilt Bed    Modified Rankin (Stroke Patients Only)       Balance Overall balance assessment: Needs assistance, History of Falls Sitting-balance support: Feet supported Sitting balance-Leahy Scale: Fair     Standing balance support: Single extremity supported, During functional activity, Reliant on assistive device for balance Standing balance-Leahy Scale: Poor Standing balance comment: Reliant on cane and hand held assist                             Pertinent Vitals/Pain Pain Assessment Pain Assessment: No/denies pain    Home Living Family/patient expects to be discharged to:: Private residence Living Arrangements: Parent Available Help at Discharge: Family;Available 24 hours/day Type of Home: Apartment Home Access: Stairs to enter Entrance Stairs-Rails: Left;Right;Can reach both Entrance Stairs-Number of Steps: 5   Home Layout: One level Home Equipment: BSC/3in1;Shower seat;Cane - quad      Prior Function Prior Level of Function :  Needs assist;History of Falls (last six months)       Physical Assist : Mobility (physical);ADLs (physical) Mobility (physical): Bed mobility;Transfers;Gait   Mobility Comments: Uses cane inconsistently for mobility, frequent falls ADLs Comments: Incontient, mom assists with all ADLs     Extremity/Trunk Assessment   Upper Extremity Assessment Upper Extremity Assessment: RUE deficits/detail RUE Deficits / Details: Hx of previous CVA, residual R sided  weakness, Flexion contracture at elbow and fingers RUE Sensation: decreased light touch RUE Coordination: decreased fine motor;decreased gross motor    Lower Extremity Assessment Lower Extremity Assessment: RLE deficits/detail RLE Deficits / Details: prior CVA, R side weakness RLE Coordination: decreased gross motor    Cervical / Trunk Assessment Cervical / Trunk Assessment: Normal  Communication   Communication Communication: Impaired Factors Affecting Communication: Reduced clarity of speech;Difficulty expressing self    Cognition Arousal: Alert Behavior During Therapy: WFL for tasks assessed/performed   PT - Cognitive impairments: Problem solving, Safety/Judgement                         Following commands: Impaired Following commands impaired: Follows one step commands with increased time     Cueing Cueing Techniques: Verbal cues, Tactile cues     General Comments General comments (skin integrity, edema, etc.): Mother present for session    Exercises     Assessment/Plan    PT Assessment Patient needs continued PT services  PT Problem List Decreased activity tolerance;Decreased balance;Decreased mobility;Decreased safety awareness;Decreased skin integrity       PT Treatment Interventions DME instruction;Gait training;Functional mobility training;Stair training;Therapeutic activities;Therapeutic exercise;Balance training;Neuromuscular re-education;Patient/family education    PT Goals (Current goals can be found in the Care Plan section)  Acute Rehab PT Goals Patient Stated Goal: return home PT Goal Formulation: With patient/family Time For Goal Achievement: 05/02/23 Potential to Achieve Goals: Good    Frequency Min 2X/week     Co-evaluation PT/OT/SLP Co-Evaluation/Treatment: Yes Reason for Co-Treatment: To address functional/ADL transfers;For patient/therapist safety PT goals addressed during session: Mobility/safety with mobility;Balance;Proper  use of DME OT goals addressed during session: ADL's and self-care;Proper use of Adaptive equipment and DME       AM-PAC PT "6 Clicks" Mobility  Outcome Measure Help needed turning from your back to your side while in a flat bed without using bedrails?: A Little Help needed moving from lying on your back to sitting on the side of a flat bed without using bedrails?: A Little Help needed moving to and from a bed to a chair (including a wheelchair)?: A Lot Help needed standing up from a chair using your arms (e.g., wheelchair or bedside chair)?: A Lot Help needed to walk in hospital room?: Total Help needed climbing 3-5 steps with a railing? : Total 6 Click Score: 12    End of Session   Activity Tolerance: Patient limited by fatigue Patient left: in bed;with call bell/phone within reach;with family/visitor present Nurse Communication: Mobility status PT Visit Diagnosis: Other abnormalities of gait and mobility (R26.89);Muscle weakness (generalized) (M62.81);Difficulty in walking, not elsewhere classified (R26.2);Unsteadiness on feet (R26.81);Repeated falls (R29.6);Hemiplegia and hemiparesis Hemiplegia - Right/Left: Right Hemiplegia - dominant/non-dominant: Dominant Hemiplegia - caused by: Cerebral infarction    Time: 4098-1191 PT Time Calculation (min) (ACUTE ONLY): 18 min   Charges:   PT Evaluation $PT Eval Moderate Complexity: 1 Mod   PT General Charges $$ ACUTE PT VISIT: 1 Visit        Anjuli Gemmill, PT, GCS 04/18/23,11:38 AM

## 2023-04-19 ENCOUNTER — Inpatient Hospital Stay (HOSPITAL_COMMUNITY): Payer: MEDICAID

## 2023-04-19 DIAGNOSIS — E162 Hypoglycemia, unspecified: Secondary | ICD-10-CM | POA: Diagnosis not present

## 2023-04-19 LAB — GLUCOSE, CAPILLARY
Glucose-Capillary: 102 mg/dL — ABNORMAL HIGH (ref 70–99)
Glucose-Capillary: 93 mg/dL (ref 70–99)
Glucose-Capillary: 85 mg/dL (ref 70–99)
Glucose-Capillary: 63 mg/dL — ABNORMAL LOW (ref 70–99)
Glucose-Capillary: 106 mg/dL — ABNORMAL HIGH (ref 70–99)
Glucose-Capillary: 115 mg/dL — ABNORMAL HIGH (ref 70–99)
Glucose-Capillary: 74 mg/dL (ref 70–99)
Glucose-Capillary: 118 mg/dL — ABNORMAL HIGH (ref 70–99)

## 2023-04-19 LAB — BASIC METABOLIC PANEL WITH GFR
Anion gap: 13 (ref 5–15)
BUN: 23 mg/dL — ABNORMAL HIGH (ref 6–20)
CO2: 17 mmol/L — ABNORMAL LOW (ref 22–32)
Calcium: 8.7 mg/dL — ABNORMAL LOW (ref 8.9–10.3)
Chloride: 105 mmol/L (ref 98–111)
Creatinine, Ser: 1.88 mg/dL — ABNORMAL HIGH (ref 0.44–1.00)
GFR, Estimated: 34 mL/min — ABNORMAL LOW (ref >60)
Glucose, Bld: 98 mg/dL (ref 70–99)
Potassium: 4.3 mmol/L (ref 3.5–5.1)
Sodium: 135 mmol/L (ref 135–145)

## 2023-04-19 MED ORDER — GADOBUTROL 1 MMOL/ML IV SOLN
8.0000 mL | Freq: Once | INTRAVENOUS | Status: AC | PRN
  Administered 2023-04-19: 8 mL via INTRAVENOUS

## 2023-04-19 MED ORDER — LORAZEPAM 2 MG/ML IJ SOLN
1.0000 mg | Freq: Once | INTRAMUSCULAR | Status: AC
  Administered 2023-04-19: 1 mg via INTRAVENOUS
  Filled 2023-04-19: qty 1

## 2023-04-19 MED ORDER — GLUCOSE 4 G PO CHEW
1.0000 | CHEWABLE_TABLET | Freq: Four times a day (QID) | ORAL | Status: DC
  Administered 2023-04-19 – 2023-04-20 (×4): 4 g via ORAL
  Filled 2023-04-19: qty 1

## 2023-04-19 NOTE — Progress Notes (Deleted)
 Initial neurology clinic note  Stacey [name] MRN: [id number] DOB: 05-31-1985  Referring provider: Persons, [place], [place]  Primary care provider: Frederic Jericho, PA-C  Reason for consult:  ***  Subjective:  This is Ms. Stacey [name], a 38 y.o. 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 The patient is accompanied by ***.  *** Increase in falls and right sided weakness was the original consult from ortho Patient had slurred speech and AMS on 04/14/23 and presented to ED. She was found to be hypoglycemic with correction of deficits with correction of hypoglycemia. MRI brain at that time showed chronic infarcts but no new stroke. Patient re-presented with slurred speech and worsening aphasia with new right face droop on 04/16/23. Patient was admitted to the hospital.  For secondary stroke prevention patient is taking aspirin 325? mg daily, plavix 75 mg daily, and atorvastatin 80 mg daily. Patient was previously on Warfarin for hypercoagulable state but was taken off for unclear reasons (?falls).  ***Sees vascular at [facility]?  MEDICATIONS:  Facility-Administered Encounter Medications as of 04/28/2023  Medication   acetaminophen (TYLENOL) tablet 650 mg   Or   acetaminophen (TYLENOL) suppository 650 mg   ARIPiprazole (ABILIFY) tablet 10 mg   aspirin tablet 325 mg   atorvastatin (LIPITOR) tablet 80 mg   baclofen (LIORESAL) tablet 10 mg   clopidogrel (PLAVIX) tablet 75 mg   dextrose 10 % infusion   dextrose 50 % solution 50 mL   heparin injection 5,000 Units   metoprolol tartrate (LOPRESSOR) tablet 25 mg   ondansetron (ZOFRAN) tablet 4 mg   Or   ondansetron (ZOFRAN) injection 4 mg   ramipril (ALTACE) capsule 2.5 mg    senna-docusate (Senokot-S) tablet 1 tablet   sertraline (ZOLOFT) tablet 150 mg   sodium bicarbonate tablet 650 mg   Outpatient Encounter Medications as of 04/28/2023  Medication Sig   ARIPiprazole (ABILIFY) 10 MG tablet Take 1 tablet (10 mg total) by mouth daily.   ascorbic acid (VITAMIN C) 500 MG tablet Take 500 mg by mouth daily.   aspirin 325 MG tablet Take 325 mg by mouth daily.   atorvastatin (LIPITOR) 80 MG tablet Take by mouth.   baclofen (LIORESAL) 10 MG tablet Take 10 mg by mouth 3 (three) times daily.    clopidogrel (PLAVIX) 75 MG tablet Take 75 mg by mouth daily. Patient taking ASA instead   iron polysaccharides (NIFEREX) 150 MG capsule Take 150 mg by mouth 2 (two) times daily.   medroxyPROGESTERone Acetate 150 MG/ML SUSY Inject into the muscle.   metoprolol tartrate (LOPRESSOR) 25 MG tablet Take 25 mg by mouth daily.   ramipril (ALTACE) 2.5 MG capsule Take 2.5 mg by mouth daily.   sertraline (ZOLOFT) 50 MG tablet Take 3 tablets (150 mg total) by mouth daily.   sodium bicarbonate 650 MG tablet Take 650 mg by mouth 2 (two) times daily.    PAST MEDICAL HISTORY: Past Medical History:  Diagnosis Date   Hypertension    Stroke [facility])     PAST SURGICAL HISTORY: No past surgical history on file.  ALLERGIES: Allergies  Allergen Reactions   Metformin Diarrhea    FAMILY HISTORY: No family history on file.  SOCIAL HISTORY: Social History   Tobacco Use   Smoking status: Never  Smokeless tobacco: Never  Vaping Use   Vaping status: Never Used  Substance Use Topics   Alcohol use: No   Drug use: No   Social History   Social History Narrative   Not on file    Objective:  Vital Signs:  There were no vitals taken for this visit.  General:*** General appearance: Awake and alert. No distress. Cooperative with exam.  Skin: No obvious rash or jaundice. HEENT: Atraumatic. Anicteric. Lungs: Non-labored breathing on room air  Heart: Regular Abdomen: Soft, non  tender. Extremities: No edema. No obvious deformity.  Musculoskeletal: No obvious joint swelling. Psych: Affect appropriate.  Neurological: Mental Status: Alert. Speech fluent. No pseudobulbar affect Cranial Nerves: CNII: No RAPD. Visual fields intact. CNIII, IV, VI: PERRL. No nystagmus. EOMI. CN V: Facial sensation intact bilaterally to fine touch. [place]eter clench strong. Jaw jerk***. CN VII: Facial muscles symmetric and strong. No ptosis at rest or after sustained upgaze***. CN VIII: Hears finger rub well bilaterally. CN IX: No hypophonia. CN X: Palate elevates symmetrically. CN XI: Full strength shoulder shrug bilaterally. CN XII: Tongue protrusion full and midline. No atrophy or fasciculations. No significant dysarthria*** Motor: Tone is ***. *** fasciculations in *** extremities. *** atrophy. No grip or percussive myotonia.  Individual muscle group testing (MRC grade out of 5):  Movement     Neck flexion ***    Neck extension ***     Right Left   Shoulder abduction *** ***   Shoulder adduction *** ***   Shoulder ext rotation *** ***   Shoulder int rotation *** ***   Elbow flexion *** ***   Elbow extension *** ***   Wrist extension *** ***   Wrist flexion *** ***   Finger abduction - FDI *** ***   Finger abduction - ADM *** ***   Finger extension *** ***   Finger distal flexion - 2/3 *** ***   Finger distal flexion - 4/5 *** ***   Thumb flexion - FPL *** ***   Thumb abduction - APB *** ***    Hip flexion *** ***   Hip extension *** ***   Hip adduction *** ***   Hip abduction *** ***   Knee extension *** ***   Knee flexion *** ***   Dorsiflexion *** ***   Plantarflexion *** ***   Inversion *** ***   Eversion *** ***   Great toe extension *** ***   Great toe flexion *** ***     Reflexes:  Right Left   Bicep *** ***   Tricep *** ***   BrRad *** ***   Knee *** ***   Ankle *** ***    Pathological Reflexes: Babinski: *** response  bilaterally*** Hoffman: *** Troemner: *** Pectoral: *** Palmomental: *** Facial: *** Midline tap: *** Sensation: Pinprick: *** Vibration: *** Temperature: *** Proprioception: *** Coordination: Intact finger-to- nose-finger bilaterally. Romberg negative.*** Gait: Able to rise from chair with arms crossed unassisted. Normal, narrow-based gait. Able to tandem walk. Able to walk on toes and heels.***   Labs and Imaging review: Internal labs: *** BMP (04/19/23) significant for Cr 1.88  External labs: 04/11/23: Vit D wnl 03/30/23: HbA1c: 5.7 07/05/22:  B12: 571 TSH wnl  04/04/22: Protein S wnl Protein C wnl Homocysteine wnl  09/11/2008: Protein C activity low at 38 Protein S activity low at 18 Factor V leiden mutation not detected Anti-thrombin III low at 45  Imaging/Procedures: External MRA head and neck wo contrast (07/29/2008): Impression:     1.  Abnormal enhancement and a few small foci of abnormal signal in the  left hemisphere could be secondary to severe stenosis of left internal  carotid artery bifurcation, but diffusion imaging shows no evidence for  acute or subacute infarct. Differential diagnosis includes vasculitis or  vasculopathy and less likely an infectious/inflammatory etiology.  Correlation with lumbar puncture recommended. Given motion on the MRA, CTA  of the extra and intracranial circulation may better delineate any vascular   stenosis.      2. Old right caudate head lacunar infarct.      3. Otherwise unremarkable motion limited MRA intracranial circulation.      4. Unremarkable motion limited MRA extracranial circulation.   External MRI brain wo contrast (09/12/2008): Impression:     1. Scattered acute/early subacute infarcts in the left greater than  right cerebral hemisphere and left internal capsule. Minimal mass-effect.  No hemorrhagic transformation or herniation.      2. Remote infarct/hemorrhage of the right basal ganglia.      3. Diminutive  caliber of the major intracranial arterial structures,  most notable in the anterior circulation.      4. Severe diffuse paranasal sinus disease with near complete  opacification of the paranasal sinuses. Findings are concerning for acute  sinus disease.      5. Small bilateral mastoid effusions.      6. Stable replacement of normal T1 marrow signal intensity in the  basiocciput.   MRI brain wo contrast (11/29/2010): IMPRESSION:    1.  Findings consistent with areas of likely subacute and acute infarction  in the left frontal region. An underlying component of chronic foci cannot  be excluded.  2.  Areas of lacunar infarction in the left MCA distribution as well as  within the right MCA distribution.  3.  Considering the patient's age, clinical correlation considering  etiologies such as vasculitis or possibly an embolic source is of  diagnostic  consideration. The patient has a reported history of hypertension and  uncontrolled malignant hypertension is also of diagnostic consideration  and  clinical correlation is recommended.   MRI brain wo contrast (04/14/23): FINDINGS: Brain: Examination moderately degraded by motion artifact.   Cerebral volume within normal limits. Patchy T2/FLAIR hyperintensity involving the periventricular and deep white matter both cerebral hemispheres, consistent with chronic small vessel ischemic disease. Encephalomalacia and gliosis involving the anterior left frontal lobe, likely related to prior infarct, left ACA and MCA distribution. Additional remote infarcts involving the right basal ganglia and left periatrial region. Small remote left cerebellar infarct.   No convincing foci of restricted diffusion to suggest acute or subacute ischemia. Note made of apparent vague diffusion signal abnormality involving the right periatrial region on axial DWI sequence (series 2, image 27), felt to be consistent with artifact. Gray-white matter  differentiation maintained. No acute intracranial hemorrhage. Minor chronic hemosiderin staining noted at the anterior left frontal region. No other significant chronic intracranial blood products.   No mass lesion, midline shift or mass effect. No hydrocephalus or extra-axial fluid collection. Pituitary gland within normal limits.   Vascular: Attenuated flow void within the left MCA distribution, in keeping with the chronic left MCA territory infarct. Major intracranial vascular flow voids are otherwise grossly maintained on this motion degraded exam.   Skull and upper cervical spine: Craniocervical junction grossly within normal limits. Bone marrow signal intensity grossly normal. No scalp soft tissue abnormality.   Sinuses/Orbits: Globes orbital soft tissues within normal limits. Paranasal sinuses are clear. No significant mastoid  effusion.   Other: None.   IMPRESSION: 1. Motion degraded exam. 2. No acute intracranial abnormality. 3. Multiple chronic infarcts involving the left ACA/MCA distribution, right basal ganglia, left periatrial region, and left cerebellum. 4. Underlying chronic microvascular ischemic disease.  CT head wo contrast (04/16/23): IMPRESSION: 1. No evidence of acute intracranial abnormality. ASPECTS of 10. 2. Unchanged chronic infarcts as above. ***  Assessment/Plan:  Stacey [name] is a 38 y.o. female who presents for evaluation of ***. *** has a relevant medical history of ***. *** neurological examination is pertinent for ***. Available diagnostic data is significant for ***. This constellation of symptoms and objective data would most likely localize to ***. ***  PLAN: -Blood work: *** ***  -Return to clinic ***  The impression above as well as the plan as outlined below were extensively discussed with the patient (in the company of ***) who voiced understanding. All questions were answered to their satisfaction.  The patient was counseled on  pertinent fall precautions per the printed material provided today, and as noted under the "Patient Instructions" section below.***  When available, results of the above investigations and possible further recommendations will be communicated to the patient via telephone/MyChart. Patient to call office if not contacted after expected testing turnaround time.   Total time spent reviewing records, interview, history/exam, documentation, and coordination of care on day of encounter:  *** min   Thank you for allowing me to participate in patient's care.  If I can answer any additional questions, I would be pleased to do so.  Rommie Coats, MD   CC: Sheryl Donna, PA-C [address] [place] [address]  CC: Referring provider: Persons, Norma [place], [place] [address] [place],  [place] [address]

## 2023-04-19 NOTE — Progress Notes (Signed)
 Progress Note   Patient: Stacey [name] WUJ:[id number] DOB: 05/09/1985 DOA: 04/16/2023     1 DOS: the patient was seen and examined on 04/19/2023   Brief hospital course: Stacey [name] is a 38 y.o. 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  Initially patient arrived as code stroke and was evaluated by neurology, code stroke was canceled after presentation . This was felt due to hypoglycemia.  Prior to discharge home patient developed recurrent symptoms and hypoglycemia with CBG 48.  Patient was given second amp of D50 and started on D5-NS maintenance fluids.  The hospitalist service was consulted to admit.  Patient remains in the hospital due to hypoglycemia, unknown cause.  Assessment and Plan: Recurrent symptomatic hypoglycemia: Etiology is unclear.  Patient with good appetite.  No previous history of hypoglycemia. Not on any medications or offending agents.  Denies any insulin use, denies any herbal or over-the-counter medications. Was started on D5-NS on admission, but continues to have episodes of hypoglycemia.  Today on 10% dextrose 50 mL/h.  Blood sugars are adequate today.  Gradually taper off dextrose infusion.  Will start patient on glucose 4 g every 6 hours today. Encourage oral intake, frequent snacking and carb intake. Cortisol, normal. C-peptide 13.2 elevated.  Insulin 225, elevated Sulfonylurea panel, pending With elevated insulin and C-peptide, will need to rule out endocrine tumor.  Will check MRI of the abdomen with and without contrast.  In the meantime we will continue to support symptomatically.   History of CVA with right-sided weakness and aphasia: Presented with worsening of her baseline residual stroke symptoms in setting of hypoglycemia. - Continue aspirin, Plavix, atorvastatin.  No evidence of  new stroke. -Patient requiring more than usual support for mobility.  Referred to a SNF.   CKD stage IIIb: Renal function stable.   -Continue sodium bicarb tablets.   Hypertension: -Continue Lopressor and ramipril.   Normocytic anemia: Hemoglobin stable at baseline.    Hyperlipidemia: -Continue atorvastatin.   Depression/anxiety: -Continue sertraline and Abilify.  Hyperparathyroidism - Continue outpatient follow up.        Subjective: Patient seen and examined.  Her mother called multiple times saying that patient is not responding well, I walked in 3 times yesterday and today morning and patient is  pleasant and interacting. Blood sugar adequate but is still on dextrose.  Physical Exam: Vitals:   04/18/23 1639 04/18/23 1945 04/19/23 0407 04/19/23 0759  BP: (!) 127/53 133/79 132/81 (!) 159/141  Pulse: 72 68 64   Resp:  16 16   Temp: 98 F (36.7 C) 99 F (37.2 C) 98.7 F (37.1 C)   TempSrc:  Oral Oral   SpO2: 95% 99% 100%   Weight:         General: Alert, awake.  Difficult to evaluate for orientation questions because of aphasia.  But looks comfortable and pleasant on interaction.  Follows commands appropriately. Eyes: Pupils equal, reactive  Oral cavity: moist mucous membranes  Head: Atraumatic, normocephalic  Neck: supple  Chest: clear to auscultation. No crackles, no wheezes  CVS: S1,S2 RRR. No murmurs  Abd: No distention, soft, non-tender. No masses palpable  Extr: No edema   MSK: No joint deformities or swelling  Neurological: Right sided hemiplegia.  Aphasic.   Data Reviewed:      Latest Ref Rng & Units 04/17/2023    5:52 AM 04/16/2023  6:45 PM 04/16/2023    6:35 PM  CBC  WBC 4.0 - 10.5 K/uL 10.6   13.4   Hemoglobin 12.0 - 15.0 g/dL 91.4  78.2  95.6   Hematocrit 36.0 - 46.0 % 36.4  37.0  36.8   Platelets 150 - 400 K/uL 282   296       Latest Ref Rng & Units 04/19/2023    4:48 AM 04/18/2023    6:00 AM 04/17/2023    5:52 AM  BMP  Glucose 70  - 99 mg/dL 98  87  78   BUN 6 - 20 mg/dL 23  16  22    Creatinine 0.44 - 1.00 mg/dL 2.13  0.86  5.78   Sodium 135 - 145 mmol/L 135  136  138   Potassium 3.5 - 5.1 mmol/L 4.3  4.3  4.7   Chloride 98 - 111 mmol/L 105  106  109   CO2 22 - 32 mmol/L 17  18  18    Calcium 8.9 - 10.3 mg/dL 8.7  9.0  8.8      Family Communication: Mother at the bedside.  Disposition: Status is: Inpatient.  Persistently hypoglycemic.  DVT ppx: SQ heparin     Time spent: 52 minutes Author: Vada Garibaldi, MD 04/19/2023 11:15 AM  For on call review [email or web address].

## 2023-04-19 NOTE — TOC Progression Note (Addendum)
 Transition of Care [facility]) - Progression Note    Patient Details  Name: Stacey [name] MRN: [id number] Date of Birth: Sep 22, 1985  Transition of Care [facility]) CM/SW Contact  Juliane Och, LCSW Phone Number: 04/19/2023, 11:00 AM  Clinical Narrative:     11:00 AM Per bedside RN, patient's mother, [name] (patient disoriented x3), expressed interest in SNF. CSW introduced self and role to [employer] at patient's bedside. [name] confirmed interest and consented CSW to submit referrals to SNFs within [place] and [place] counties. CSW made [name] aware of possible lack of bed offers due to patient's insurance, which [name] expressed understanding of. [name] informed CSW that patient receives about $900 a month of SSI. CSW informed [name] that for a Medicaid bed all but $70 would be taken to pay for stay at [facility], which [name] expressed understanding of.  11:17 AM PASRR is currently pending additional clinical review.  11:54 AM Per Tammy of [employer], SNFs [facility], [place] and [address] could extend bed offer pending LOG. [facility] SNF confirmed they could offer bed pending financial verification via SNF business office.  2:54 PM CSW submitted additional clinicals required for PASRR review.  Expected Discharge Plan: Skilled Nursing Facility Barriers to Discharge: Continued Medical Work up, SNF Pending bed offer  Expected Discharge Plan and Services In-house Referral: Clinical Social Work Discharge Planning Services: CM Consult Post Acute Care Choice: Skilled Nursing Facility Living arrangements for the past 2 months: Apartment                                       Social Determinants of Health (SDOH) Interventions SDOH Screenings   Food Insecurity: No Food Insecurity (04/19/2023)  Housing: Unknown (04/19/2023)  Transportation Needs: No Transportation Needs (04/19/2023)  Utilities: Not At Risk (04/19/2023)  Alcohol Screen: Low Risk  (03/22/2022)  Depression  (PHQ2-9): High Risk (04/04/2023)  Tobacco Use: Low Risk  (04/16/2023)    Readmission Risk Interventions     No data to display

## 2023-04-19 NOTE — TOC CM/SW Note (Signed)
 RE: Stacey [name]. Kaman Date of Birth: 26-Jul-1985 Date: 04/19/2023  Please be advised that the above-named patient will require a short-term nursing home stay - anticipated 30 days or less for rehabilitation and strengthening.  The plan is for return home.  Hershal Loron, MSW, LCSW-A Transitions of Care  Clinical Social Worker I [phone]

## 2023-04-19 NOTE — Plan of Care (Signed)

## 2023-04-19 NOTE — NC FL2 (Signed)
 [place]  MEDICAID FL2 LEVEL OF CARE FORM     IDENTIFICATION  Patient Name: Stacey [name] Birthdate: 03-12-1985 Sex: female Admission Date (Current Location): 04/16/2023  [facility] and [place] Number:  Producer, television/film/video and Address:  [facility] [employer]. [facility], 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 04/18/2023   Hypoglycemia 04/16/2023   History of CVA with residual deficit 04/16/2023   Chronic kidney disease, stage 3b (HCC) 04/16/2023   Depression 11/30/2021   Mild cognitive impairment 11/19/2021   Anemia due to stage 3b chronic kidney disease (HCC) 09/15/2021   Persistent proteinuria 09/15/2021   Anxiety 07/07/2021   GAD (generalized anxiety disorder) 04/13/2021   MDD (major depressive disorder), recurrent episode (HCC) 04/13/2021   CVA (cerebrovascular accident) (HCC) 04/24/2017   Genital herpes 04/24/2017   Hyperlipidemia, unspecified 04/24/2017   Hypertension 04/24/2017   Migraine headache 04/24/2017   Prediabetes 10/24/2014   Chronic kidney disease, stage II (mild) 10/09/2014   Absence of bladder continence 11/18/2013   Eczema 08/22/2012   Contraception 08/25/2010   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   Anemia 03/28/2009   Hypokalemia 03/13/2009   Hypernatremia 03/12/2009   Acute renal failure (HCC) 03/11/2009   Right hemiparesis (HCC) 01/29/2009   Thrombophilia (HCC) 01/29/2009   Acute blood loss anemia 10/23/2008   Vaginal bleeding, abnormal 10/22/2008   Hypercoagulable state  (HCC) 09/23/2008   Personal history of transient ischemic attack (TIA) and cerebral infarction without residual deficit 07/30/2008    Orientation RESPIRATION BLADDER Height & Weight     Self  Normal (Room Air) Incontinent Weight: 185 lb 10 oz (84.2 kg) Height:     BEHAVIORAL SYMPTOMS/MOOD NEUROLOGICAL BOWEL NUTRITION STATUS    Convulsions/Seizures (History of seizure disorder) Continent Diet (Please see dc summary)  AMBULATORY STATUS COMMUNICATION OF NEEDS Skin   Extensive Assist Verbally Normal                       Personal Care Assistance Level of Assistance  Bathing, Feeding, Dressing Bathing Assistance: Maximum assistance Feeding assistance: Maximum assistance Dressing Assistance: Maximum assistance     Functional Limitations Info  Sight Sight Info: Impaired (R and L (Eyeglasses))        SPECIAL CARE FACTORS FREQUENCY  PT (By licensed PT), OT (By licensed OT)     PT Frequency: 5x OT Frequency: 5x            Contractures Contractures Info: Not present    Additional Factors Info  Code Status, Allergies, Insulin Sliding Scale Code Status Info: Full Code Allergies Info: Metformin   Insulin Sliding Scale Info: Please see dc summary       Current Medications (04/19/2023):  This is the current hospital active medication list Current Facility-Administered Medications  Medication Dose Route Frequency Provider Last Rate Last Admin   acetaminophen (TYLENOL) tablet 650 mg  650 mg Oral Q6H PRN Patel, Vishal R, MD       Or   acetaminophen (TYLENOL) suppository 650 mg  650 mg  Rectal Q6H PRN Patel, Vishal R, MD       ARIPiprazole (ABILIFY) tablet 10 mg  10 mg Oral Daily Patel, Vishal R, MD   10 mg at 04/19/23 0827   aspirin tablet 325 mg  325 mg Oral Daily Patel, Vishal R, MD   325 mg at 04/19/23 0826   atorvastatin (LIPITOR) tablet 80 mg  80 mg Oral Daily Patel, Vishal R, MD   80 mg at 04/19/23 0827   baclofen (LIORESAL) tablet 10 mg  10 mg Oral TID Patel, Vishal  R, MD   10 mg at 04/19/23 0827   clopidogrel (PLAVIX) tablet 75 mg  75 mg Oral Daily Patel, Vishal R, MD   75 mg at 04/19/23 0827   dextrose 10 % infusion   Intravenous Continuous Vada Garibaldi, MD 25 mL/hr at 04/19/23 0825 Rate Change at 04/19/23 0825   dextrose 50 % solution 50 mL  1 ampule Intravenous PRN Mdala-Gausi, Masiku Agatha, MD   50 mL at 04/17/23 1422   glucose chewable tablet 4 g  1 tablet Oral Q6H Ghimire, Kuber, MD       heparin injection 5,000 Units  5,000 Units Subcutaneous Q8H Patel, Vishal R, MD   5,000 Units at 04/19/23 0537   LORazepam (ATIVAN) injection 1 mg  1 mg Intravenous Once Ghimire, Kuber, MD       metoprolol tartrate (LOPRESSOR) tablet 25 mg  25 mg Oral Daily Patel, Vishal R, MD   25 mg at 04/19/23 0828   ondansetron (ZOFRAN) tablet 4 mg  4 mg Oral Q6H PRN Patel, Vishal R, MD       Or   ondansetron (ZOFRAN) injection 4 mg  4 mg Intravenous Q6H PRN Patel, Vishal R, MD       ramipril (ALTACE) capsule 2.5 mg  2.5 mg Oral Daily Patel, Vishal R, MD   2.5 mg at 04/19/23 0830   senna-docusate (Senokot-S) tablet 1 tablet  1 tablet Oral QHS PRN Patel, Vishal R, MD       sertraline (ZOLOFT) tablet 150 mg  150 mg Oral Daily Patel, Vishal R, MD   150 mg at 04/19/23 0827   sodium bicarbonate tablet 650 mg  650 mg Oral BID Patel, Vishal R, MD   650 mg at 04/19/23 4034     Discharge Medications: Please see discharge summary for a list of discharge medications.  Relevant Imaging Results:  Relevant Lab Results:   Additional Information SS# [id number]  Juliane Och, LCSW

## 2023-04-20 ENCOUNTER — Encounter: Payer: MEDICAID | Admitting: Occupational Therapy

## 2023-04-20 DIAGNOSIS — E162 Hypoglycemia, unspecified: Secondary | ICD-10-CM | POA: Diagnosis not present

## 2023-04-20 LAB — BETA-HYDROXYBUTYRIC ACID: Beta-Hydroxybutyric Acid: 0.13 mmol/L (ref 0.05–0.27)

## 2023-04-20 LAB — BASIC METABOLIC PANEL WITH GFR
Anion gap: 8 (ref 5–15)
BUN: 39 mg/dL — ABNORMAL HIGH (ref 6–20)
CO2: 18 mmol/L — ABNORMAL LOW (ref 22–32)
Calcium: 8.7 mg/dL — ABNORMAL LOW (ref 8.9–10.3)
Chloride: 110 mmol/L (ref 98–111)
Creatinine, Ser: 1.95 mg/dL — ABNORMAL HIGH (ref 0.44–1.00)
GFR, Estimated: 33 mL/min — ABNORMAL LOW (ref >60)
Glucose, Bld: 96 mg/dL (ref 70–99)
Potassium: 4.6 mmol/L (ref 3.5–5.1)
Sodium: 136 mmol/L (ref 135–145)

## 2023-04-20 LAB — GLUCOSE, CAPILLARY
Glucose-Capillary: 100 mg/dL — ABNORMAL HIGH (ref 70–99)
Glucose-Capillary: 123 mg/dL — ABNORMAL HIGH (ref 70–99)
Glucose-Capillary: 132 mg/dL — ABNORMAL HIGH (ref 70–99)
Glucose-Capillary: 137 mg/dL — ABNORMAL HIGH (ref 70–99)
Glucose-Capillary: 138 mg/dL — ABNORMAL HIGH (ref 70–99)
Glucose-Capillary: 47 mg/dL — ABNORMAL LOW (ref 70–99)
Glucose-Capillary: 56 mg/dL — ABNORMAL LOW (ref 70–99)
Glucose-Capillary: 77 mg/dL (ref 70–99)
Glucose-Capillary: 78 mg/dL (ref 70–99)
Glucose-Capillary: 79 mg/dL (ref 70–99)
Glucose-Capillary: 85 mg/dL (ref 70–99)
Glucose-Capillary: 92 mg/dL (ref 70–99)
Glucose-Capillary: 99 mg/dL (ref 70–99)

## 2023-04-20 LAB — GLUCOSE, RANDOM
Glucose, Bld: 174 mg/dL — ABNORMAL HIGH (ref 70–99)
Glucose, Bld: 58 mg/dL — ABNORMAL LOW (ref 70–99)

## 2023-04-20 MED ORDER — DEXTROSE 10 % IV SOLN: INTRAVENOUS | Status: DC

## 2023-04-20 MED ORDER — GLUCAGON HCL RDNA (DIAGNOSTIC) 1 MG IJ SOLR
1.0000 mg | Freq: Once | INTRAMUSCULAR | Status: AC
  Administered 2023-04-20: 1 mg via INTRAVENOUS
  Filled 2023-04-20: qty 1

## 2023-04-20 MED ORDER — SODIUM CHLORIDE 0.9 % IV SOLN: INTRAVENOUS | Status: DC

## 2023-04-20 NOTE — Progress Notes (Signed)
 OT Cancellation Note  Patient Details Name: [name] MRN: [id number] DOB: 23-Dec-1985   Cancelled Treatment:    Reason Eval/Treat Not Completed: Fatigue/lethargy limiting ability to participate. Per MD and PT note, pt hypoglycemic and lethargic. Will hold OT session, until arousal levels improve.   Tomio Kirk C, OT  Acute Rehabilitation Services Office [phone] Secure chat preferred   Mickael Alamo 04/20/2023, 2:53 PM

## 2023-04-20 NOTE — Progress Notes (Signed)
 Progress Note   Patient: Stacey [name] YNW:[id number] DOB: [birth year] DOA: 04/16/2023     2 DOS: the patient was seen and examined on 04/20/2023   Brief hospital course: Stacey [name] is a 38 y.o. 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  Initially patient arrived as code stroke and was evaluated by neurology, code stroke was canceled after presentation . This was felt due to hypoglycemia.  Prior to discharge home patient developed recurrent symptoms and hypoglycemia with CBG 48.  Patient was given second amp of D50 and started on D5-NS maintenance fluids.  The hospitalist service was consulted to admit.  Patient remains in the hospital due to hypoglycemia, unknown cause. Stabilizing.  Will need rehab.  Assessment and Plan: Recurrent symptomatic hypoglycemia: Etiology is unclear.  Patient with fair appetite.  No previous history of hypoglycemia. Not on any medications or offending agents.  Denies any insulin use, denies any herbal or over-the-counter medications. Patient initially started on D5 normal saline and then dextrose.  Tapered off now. Keeping patient on oral glucose 4 g every 6 hours, maintaining blood glucose for last 24 hours without hypoglycemia episodes. Encourage oral intake, frequent snacking and carb intake. Cortisol, normal. C-peptide 13.2 elevated.  Insulin 225, elevated Sulfonylurea panel, pending With elevated insulin and C-peptide, MRI of the abdomen was done it was poor quality but did not show any clear evidence of neuroendocrine tumor. Will send referral to endocrine for follow-up.  Will watch next 24 hours to make sure patient stays stable and discharged to skilled nursing facility.  Case discussed with endocrine over the phone  Patient with inconsistent diagnosis, will  need to make diagnosis of neuroendocrine tumor and insulinoma with more aggressive test. Keep n.p.o. allow ice chips Blood glucose every 2 hours When blood glucose drops less than 60 Draw serum glucose, proinsulin, insulin, C-peptide stat before correcting for hypoglycemia if patient is stable. Once drawn, will give 1 dose of glucagon and check blood sugar after 15 minutes.    History of CVA with right-sided weakness and aphasia: Presented with worsening of her baseline residual stroke symptoms in setting of hypoglycemia. - Continue aspirin, Plavix, atorvastatin.  No evidence of new stroke. -Patient requiring more than usual support for mobility.  Referred to a SNF.   CKD stage IIIb: Renal function stable.   -Continue sodium bicarb tablets.   Hypertension: -Continue Lopressor and ramipril.   Normocytic anemia: Hemoglobin stable at baseline.    Hyperlipidemia: -Continue atorvastatin.   Depression/anxiety: -Continue sertraline and Abilify.  Hyperparathyroidism - Continue outpatient follow up.   Mild cognitive impairment/intellectual disability: Patient does have history of cognitive impairment.  Also with medical comorbidities.  She lives at home with support from her mother.  Currently well established.       Subjective: Patient seen and examined.  Laughing and responding.  Mother still reports some episodes where she will not like to talk.  Blood glucose has remained stable since last 24 hours.  Appetite is poor but mother is pushing snacking's.  Physical Exam: Vitals:   04/19/23 1630 04/19/23 1936 04/20/23 0348 04/20/23 0900  BP: 129/89 134/86 (!) 149/67 120/86  Pulse: 75 82 70 80  Resp:  16 16 16   Temp: 98 F (36.7 C) 98.8 F (37.1 C) 98.8 F (37.1 C) 99 F (37.2 C)  TempSrc:  Oral Oral Oral  SpO2: 94%  100% 100% 100%  Weight:         General: Alert, awake.  Difficult to evaluate for orientation questions because of aphasia.  But looks comfortable and  pleasant on interaction.  Follows appropriate commands.  Pleasant interaction. Eyes: Pupils equal, reactive  Oral cavity: moist mucous membranes  Head: Atraumatic, normocephalic  Neck: supple  Chest: clear to auscultation. No crackles, no wheezes  CVS: S1,S2 RRR. No murmurs  Abd: No distention, soft, non-tender. No masses palpable  Extr: No edema   MSK: No joint deformities or swelling  Neurological: Right sided hemiplegia.  Aphasic.   Data Reviewed:      Latest Ref Rng & Units 04/17/2023    5:52 AM 04/16/2023    6:45 PM 04/16/2023    6:35 PM  CBC  WBC 4.0 - 10.5 K/uL 10.6   13.4   Hemoglobin 12.0 - 15.0 g/dL 95.6  21.3  08.6   Hematocrit 36.0 - 46.0 % 36.4  37.0  36.8   Platelets 150 - 400 K/uL 282   296       Latest Ref Rng & Units 04/20/2023    5:23 AM 04/19/2023    4:48 AM 04/18/2023    6:00 AM  BMP  Glucose 70 - 99 mg/dL 96  98  87   BUN 6 - 20 mg/dL 39  23  16   Creatinine 0.44 - 1.00 mg/dL 5.78  4.69  6.29   Sodium 135 - 145 mmol/L 136  135  136   Potassium 3.5 - 5.1 mmol/L 4.6  4.3  4.3   Chloride 98 - 111 mmol/L 110  105  106   CO2 22 - 32 mmol/L 18  17  18    Calcium 8.9 - 10.3 mg/dL 8.7  8.7  9.0      Family Communication: Mother at the bedside.  Disposition: Status is: Inpatient.  Persistently hypoglycemic.  Needs SNF.  DVT ppx: SQ heparin     Time spent: 52 minutes Author: Vada Garibaldi, MD 04/20/2023 10:34 AM  For on call review [email or web address].

## 2023-04-20 NOTE — TOC Progression Note (Addendum)
 Transition of Care [facility]) - Progression Note    Patient Details  Name: Stacey [name] MRN: [id number] Date of Birth: 1985/12/23  Transition of [facility]) CM/SW Contact  Juliane Och, LCSW Phone Number: 04/20/2023, 9:48 AM  Clinical Narrative:     9:49 AM Per [facility] SNF admissions, patient's mother would need to transfer managed [employer] from [facility] to direct Medicaid prior to admission. CSW relayed information to patient's mother, [name], at patient's bedside and provided current SNF options ([facility], [place], [address], [address], [place]) with [employer].  12:17 PM CSW uploaded additional clinicals for PASRR reviewing regarding IDD.  12:54 PM Patient received PASRR [phone] E valid 04/17-05/17.  Expected Discharge Plan: Skilled Nursing Facility Barriers to Discharge: Other (must enter comment), Continued Medical Work up (SNF pending bed decision)  Expected Discharge Plan and Services In-house Referral: Clinical Social Work Discharge Planning Services: CM Consult Post Acute Care Choice: Skilled Nursing Facility Living arrangements for the past 2 months: Apartment                                       Social Determinants of Health (SDOH) Interventions SDOH Screenings   Food Insecurity: No Food Insecurity (04/19/2023)  Housing: Unknown (04/19/2023)  Transportation Needs: No Transportation Needs (04/19/2023)  Utilities: Not At Risk (04/19/2023)  Alcohol Screen: Low Risk  (03/22/2022)  Depression (PHQ2-9): High Risk (04/04/2023)  Tobacco Use: Low Risk  (04/16/2023)    Readmission Risk Interventions     No data to display

## 2023-04-20 NOTE — Progress Notes (Signed)
 Patient with inconsistent diagnosis, will need to make diagnosis of neuroendocrine tumor and insulinoma with more aggressive test. Keep n.p.o. allow ice chips.  Normal saline maintenance. Blood glucose every 1 hours When blood glucose drops less than 60 Draw stat serum glucose, proinsulin, insulin, C-peptide before correcting for hypoglycemia if patient is stable. Once labs are drawn , will give 1 dose of glucagon and check blood sugar after 15 minutes again and record before correcting hypoglycemia.  Of course, if neurological unstable needs to correct it anyway. Patient needs to be in progressive bed for every 1 hour Accu-Cheks.

## 2023-04-20 NOTE — Plan of Care (Signed)
  Problem: Clinical Measurements: Goal: Ability to maintain clinical measurements within normal limits will improve Outcome: Progressing Goal: Will remain free from infection Outcome: Progressing Goal: Diagnostic test results will improve Outcome: Progressing Goal: Respiratory complications will improve Outcome: Progressing Goal: Cardiovascular complication will be avoided Outcome: Progressing   Problem: Nutrition: Goal: Adequate nutrition will be maintained Outcome: Progressing   Problem: Education: Goal: Knowledge of General Education information will improve Description: Including pain rating scale, medication(s)/side effects and non-pharmacologic comfort measures Outcome: Not Progressing   Problem: Health Behavior/Discharge Planning: Goal: Ability to manage health-related needs will improve Outcome: Not Progressing   Problem: Activity: Goal: Risk for activity intolerance will decrease Outcome: Not Progressing

## 2023-04-20 NOTE — Progress Notes (Signed)
 Physical Therapy Treatment Patient Details Name: Stacey [name] MRN: [id number] DOB: 1985/10/21 Today's Date: 04/20/2023   History of Present Illness Pt is a 38 y.o. female admitted via ems as code stroke 4/13. MRI showed no acute changes, multiple chronic infarcts of L ACA/MCA. Pt found to be hypoglycemic PMH: CVA w/ residual R sided weakness & aphasia, HTN, DM, CKD    PT Comments  Pt received in bed, very lethargic today. Needed more assist with bed mobility as was falling back asleep even in transitions. Mod A needed to come to EOB. Worked on sitting balance EOB but pt having difficulty participating with lethargy and seemed somewhat confused by tasks. Mod A +2 needed to stand and ambulate 4' with [facility] and HHA on R. Had difficulty wt shifting to R. After 4' had to sit due to fatigue and increased B knee flexion. Pt has had a decline in functional status and recommend continued skilled rehab <3 hrs/ day to be able to return to mom's house. PT will continue to follow.     If plan is discharge home, recommend the following: A lot of help with walking and/or transfers;A lot of help with bathing/dressing/bathroom;Assist for transportation;Help with stairs or ramp for entrance;Assistance with cooking/housework   Can travel by private vehicle        Equipment Recommendations  None recommended by PT    Recommendations for Other Services       Precautions / Restrictions Precautions Precautions: Fall Recall of Precautions/Restrictions: Impaired Restrictions Weight Bearing Restrictions Per Provider Order: No     Mobility  Bed Mobility Overal bed mobility: Needs Assistance Bed Mobility: Supine to Sit     Supine to sit: Mod assist     General bed mobility comments: increased assist needed due to pt lethargy. As pt woke more she was able to push self up into sitting    Transfers Overall transfer level: Needs assistance Equipment used: Quad cane Transfers: Sit to/from Stand Sit to  Stand: Mod assist, +2 safety/equipment, +2 physical assistance           General transfer comment: mod A +2 to steady with initial standing    Ambulation/Gait Ambulation/Gait assistance: Mod assist, +2 physical assistance Gait Distance (Feet): 4 Feet Assistive device: Quad cane Gait Pattern/deviations: Step-to pattern Gait velocity: decreased Gait velocity interpretation: <1.31 ft/sec, indicative of household ambulator   General Gait Details: pt having difficulty wt shifting, esp to R. Mod A +2 needed for safety. Pt was able to pick each foot up to step. Needed vc's for sequencing   Stairs             Wheelchair Mobility     Tilt Bed    Modified Rankin (Stroke Patients Only)       Balance Overall balance assessment: Needs assistance, History of Falls Sitting-balance support: Feet supported Sitting balance-Leahy Scale: Fair Sitting balance - Comments: worked on functional activities in sitting but pt participating minimally due to lethargy and seemed somewhat confused by the task Postural control: Posterior lean Standing balance support: Single extremity supported, During functional activity, Reliant on assistive device for balance Standing balance-Leahy Scale: Poor Standing balance comment: Reliant on cane and hand held assist                            Communication Communication Communication: Impaired Factors Affecting Communication: Reduced clarity of speech;Difficulty expressing self  Cognition Arousal: Alert Behavior During Therapy: [facility] for tasks assessed/performed  PT - Cognitive impairments: Problem solving, Safety/Judgement                         Following commands: Impaired Following commands impaired: Follows one step commands with increased time    Cueing Cueing Techniques: Verbal cues, Tactile cues  Exercises      General Comments General comments (skin integrity, edema, etc.): mother present. Placed washcloth in  pt's L hand to prevent nails from digging into palm. Mother to bring hand splint back next time she goes home      Pertinent Vitals/Pain Pain Assessment Pain Assessment: No/denies pain    Home Living                          Prior Function            PT Goals (current goals can now be found in the care plan section) Acute Rehab PT Goals Patient Stated Goal: return home PT Goal Formulation: With patient/family Time For Goal Achievement: 05/02/23 Potential to Achieve Goals: Good Progress towards PT goals: Progressing toward goals    Frequency    Min 2X/week      PT Plan      Co-evaluation              AM-PAC PT "6 Clicks" Mobility   Outcome Measure  Help needed turning from your back to your side while in a flat bed without using bedrails?: A Little Help needed moving from lying on your back to sitting on the side of a flat bed without using bedrails?: A Little Help needed moving to and from a bed to a chair (including a wheelchair)?: A Lot Help needed standing up from a chair using your arms (e.g., wheelchair or bedside chair)?: A Lot Help needed to walk in hospital room?: Total Help needed climbing 3-5 steps with a railing? : Total 6 Click Score: 12    End of Session   Activity Tolerance: Patient limited by fatigue Patient left: in bed;with call bell/phone within reach;with family/visitor present Nurse Communication: Mobility status PT Visit Diagnosis: Other abnormalities of gait and mobility (R26.89);Muscle weakness (generalized) (M62.81);Difficulty in walking, not elsewhere classified (R26.2);Unsteadiness on feet (R26.81);Repeated falls (R29.6);Hemiplegia and hemiparesis Hemiplegia - Right/Left: Right Hemiplegia - dominant/non-dominant: Dominant Hemiplegia - caused by: Cerebral infarction     Time: 1140-1208 PT Time Calculation (min) (ACUTE ONLY): 28 min  Charges:    $Gait Training: 8-22 mins $Therapeutic Activity: 8-22 mins PT General  Charges $$ ACUTE PT VISIT: 1 Visit                     Amey Ka, PT  Acute Rehab Services Secure chat preferred Office [phone]    Stacey [name] 04/20/2023, 12:57 PM

## 2023-04-20 NOTE — [place]2 (Signed)
 [place] MEDICAID FL2 LEVEL OF CARE FORM     IDENTIFICATION  Patient Name: Stacey [name] Birthdate: September 26, 1985 Sex: female Admission Date (Current Location): 04/16/2023  [facility] and [place] Number:  Producer, television/film/video and Address:  [facility], 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 04/18/2023   Hypoglycemia 04/16/2023   History of CVA with residual deficit 04/16/2023   Chronic kidney disease, stage 3b (HCC) 04/16/2023   Depression 11/30/2021   Mild cognitive impairment 11/19/2021   Anemia due to stage 3b chronic kidney disease (HCC) 09/15/2021   Persistent proteinuria 09/15/2021   Anxiety 07/07/2021   GAD (generalized anxiety disorder) 04/13/2021   MDD (major depressive disorder), recurrent episode (HCC) 04/13/2021   CVA (cerebrovascular accident) (HCC) 04/24/2017   Genital herpes 04/24/2017   Hyperlipidemia, unspecified 04/24/2017   Hypertension 04/24/2017   Migraine headache 04/24/2017   Prediabetes 10/24/2014   Chronic kidney disease, stage II (mild) 10/09/2014   Absence of bladder continence 11/18/2013   Eczema 08/22/2012   Contraception 08/25/2010   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   Anemia 03/28/2009   Hypokalemia 03/13/2009   Hypernatremia 03/12/2009   Acute renal failure (HCC) 03/11/2009   Right hemiparesis (HCC) 01/29/2009   Thrombophilia (HCC) 01/29/2009   Acute blood loss anemia 10/23/2008   Vaginal bleeding, abnormal 10/22/2008   Hypercoagulable  state (HCC) 09/23/2008   Personal history of transient ischemic attack (TIA) and cerebral infarction without residual deficit 07/30/2008    Orientation RESPIRATION BLADDER Height & Weight     Self  Normal (Room Air) Incontinent Weight: 185 lb 10 oz (84.2 kg) Height:     BEHAVIORAL SYMPTOMS/MOOD NEUROLOGICAL BOWEL NUTRITION STATUS    Convulsions/Seizures (History of seizure disorder) Continent Diet (Please see dc summary)  AMBULATORY STATUS COMMUNICATION OF NEEDS Skin   Extensive Assist Verbally Normal                       Personal Care Assistance Level of Assistance  Bathing, Feeding, Dressing Bathing Assistance: Maximum assistance Feeding assistance: Maximum assistance Dressing Assistance: Maximum assistance     Functional Limitations Info  Sight Sight Info: Impaired (R and L (Eyeglasses))        SPECIAL CARE FACTORS FREQUENCY  PT (By licensed PT), OT (By licensed OT)     PT Frequency: 5x OT Frequency: 5x            Contractures Contractures Info: Not present    Additional Factors Info  Code Status, Allergies, Insulin Sliding Scale Code Status Info: Full Code Allergies Info: Metformin   Insulin Sliding Scale Info: Please see dc summary       Current Medications (04/20/2023):  This is the current hospital active medication list Current Facility-Administered Medications  Medication Dose Route Frequency Provider Last Rate Last Admin   0.9 %  sodium chloride infusion   Intravenous Continuous Dorcas Carrow, MD 40 mL/hr at 04/20/23 1231 New Bag at 04/20/23 1231   acetaminophen (TYLENOL) tablet 650 mg  650 mg  Oral Q6H PRN Patel, Vishal R, MD       Or   acetaminophen (TYLENOL) suppository 650 mg  650 mg Rectal Q6H PRN Patel, Vishal R, MD       ARIPiprazole (ABILIFY) tablet 10 mg  10 mg Oral Daily Patel, Vishal R, MD   10 mg at 04/20/23 1011   aspirin tablet 325 mg  325 mg Oral Daily Patel, Vishal R, MD   325 mg at 04/20/23 1011   atorvastatin (LIPITOR) tablet  80 mg  80 mg Oral Daily Patel, Vishal R, MD   80 mg at 04/20/23 1010   baclofen (LIORESAL) tablet 10 mg  10 mg Oral TID Patel, Vishal R, MD   10 mg at 04/20/23 1011   clopidogrel (PLAVIX) tablet 75 mg  75 mg Oral Daily Patel, Vishal R, MD   75 mg at 04/20/23 1010   dextrose 50 % solution 50 mL  1 ampule Intravenous PRN Mdala-Gausi, Masiku Agatha, MD   50 mL at 04/17/23 1422   heparin injection 5,000 Units  5,000 Units Subcutaneous Q8H Patel, Vishal R, MD   5,000 Units at 04/20/23 0548   metoprolol tartrate (LOPRESSOR) tablet 25 mg  25 mg Oral Daily Patel, Vishal R, MD   25 mg at 04/20/23 1011   ondansetron (ZOFRAN) tablet 4 mg  4 mg Oral Q6H PRN Patel, Vishal R, MD       Or   ondansetron (ZOFRAN) injection 4 mg  4 mg Intravenous Q6H PRN Patel, Vishal R, MD       ramipril (ALTACE) capsule 2.5 mg  2.5 mg Oral Daily Patel, Vishal R, MD   2.5 mg at 04/20/23 1011   senna-docusate (Senokot-S) tablet 1 tablet  1 tablet Oral QHS PRN Patel, Vishal R, MD       sertraline (ZOLOFT) tablet 150 mg  150 mg Oral Daily Patel, Vishal R, MD   150 mg at 04/20/23 1010   sodium bicarbonate tablet 650 mg  650 mg Oral BID Patel, Vishal R, MD   650 mg at 04/20/23 1010     Discharge Medications: Please see discharge summary for a list of discharge medications.  Relevant Imaging Results:  Relevant Lab Results:   Additional Information SS# [id number]  Juliane Och, LCSW

## 2023-04-21 DIAGNOSIS — E162 Hypoglycemia, unspecified: Secondary | ICD-10-CM | POA: Diagnosis not present

## 2023-04-21 LAB — C-PEPTIDE
C-Peptide: 33.5 ng/mL — ABNORMAL HIGH (ref 1.1–4.4)
C-Peptide: 20.5 ng/mL — ABNORMAL HIGH (ref 1.1–4.4)

## 2023-04-21 LAB — BASIC METABOLIC PANEL WITH GFR
Anion gap: 9 (ref 5–15)
BUN: 55 mg/dL — ABNORMAL HIGH (ref 6–20)
CO2: 18 mmol/L — ABNORMAL LOW (ref 22–32)
Calcium: 8.7 mg/dL — ABNORMAL LOW (ref 8.9–10.3)
Chloride: 110 mmol/L (ref 98–111)
Creatinine, Ser: 2.19 mg/dL — ABNORMAL HIGH (ref 0.44–1.00)
GFR, Estimated: 29 mL/min — ABNORMAL LOW (ref >60)
Glucose, Bld: 145 mg/dL — ABNORMAL HIGH (ref 70–99)
Potassium: 4.7 mmol/L (ref 3.5–5.1)
Sodium: 137 mmol/L (ref 135–145)

## 2023-04-21 LAB — GLUCOSE, CAPILLARY
Glucose-Capillary: 167 mg/dL — ABNORMAL HIGH (ref 70–99)
Glucose-Capillary: 179 mg/dL — ABNORMAL HIGH (ref 70–99)
Glucose-Capillary: 126 mg/dL — ABNORMAL HIGH (ref 70–99)
Glucose-Capillary: 164 mg/dL — ABNORMAL HIGH (ref 70–99)
Glucose-Capillary: 128 mg/dL — ABNORMAL HIGH (ref 70–99)
Glucose-Capillary: 119 mg/dL — ABNORMAL HIGH (ref 70–99)
Glucose-Capillary: 101 mg/dL — ABNORMAL HIGH (ref 70–99)
Glucose-Capillary: 144 mg/dL — ABNORMAL HIGH (ref 70–99)
Glucose-Capillary: 157 mg/dL — ABNORMAL HIGH (ref 70–99)
Glucose-Capillary: 119 mg/dL — ABNORMAL HIGH (ref 70–99)

## 2023-04-21 LAB — INSULIN, RANDOM
Insulin: 452 u[IU]/mL — ABNORMAL HIGH (ref 2.6–24.9)
Insulin: 73.8 u[IU]/mL — ABNORMAL HIGH (ref 2.6–24.9)

## 2023-04-21 MED ORDER — GLUCOSE 4 G PO CHEW: 1.0000 | CHEWABLE_TABLET | Freq: Four times a day (QID) | ORAL | Status: AC

## 2023-04-21 MED ORDER — DEXTROSE 10 % IV SOLN
50.0000 mL/h | INTRAVENOUS | Status: AC
Start: 2023-04-21 — End: ?

## 2023-04-21 MED ORDER — GLUCOSE 4 G PO CHEW
1.0000 | CHEWABLE_TABLET | Freq: Four times a day (QID) | ORAL | Status: DC
  Administered 2023-04-21 – 2023-04-22 (×6): 4 g via ORAL
  Filled 2023-04-21 (×2): qty 1

## 2023-04-21 NOTE — TOC Progression Note (Signed)
 Transition of Care [facility]) - Progression Note    Patient Details  Name: Stacey [name] MRN: [id number] Date of Birth: 16-Nov-1985  [facility] CM/SW Contact  Juliane Och, LCSW Phone Number: 04/21/2023, 4:26 PM  Clinical Narrative:     4:26 PM CSW attempted to obtain SNF decision from patient's mother, [name] (patient disoriented x3). [name] was not at patient's bedside. CSW attempted to call [name] but there was no response and a voicemail was left.   Expected Discharge Plan: Skilled Nursing Facility Barriers to Discharge: Other (must enter comment), Continued Medical Work up (SNF pending bed decision)  Expected Discharge Plan and Services In-house Referral: Clinical Social Work Discharge Planning Services: CM Consult Post Acute Care Choice: Skilled Nursing Facility Living arrangements for the past 2 months: Apartment                                       Social Determinants of Health (SDOH) Interventions SDOH Screenings   Food Insecurity: No Food Insecurity (04/19/2023)  Housing: Unknown (04/19/2023)  Transportation Needs: No Transportation Needs (04/19/2023)  Utilities: Not At Risk (04/19/2023)  Alcohol Screen: Low Risk  (03/22/2022)  Depression (PHQ2-9): High Risk (04/04/2023)  Tobacco Use: Low Risk  (04/16/2023)    Readmission Risk Interventions     No data to display

## 2023-04-21 NOTE — Progress Notes (Signed)
 Patient underwent fasting blood sugar monitoring.  4/17, 2 hours into fasting Peripheral blood glucose - 47,  Immediate labs Serum blood glucose, 58 Insulin  levels, 73 C-peptide, 20.5 Somatomedin C, 271 Beta hydroxybutyrate, 0.13  Glucagon  1 mg given after blood draw, 15 minutes blood glucose 73.  MRI abdomen poor quality but no obvious pancreatic tumor. Discussed case with outpatient endocrinologist Dr. Aretha Kubas.  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  Patient will get hypoglycemic when taken off dextrose .  Patient will need inpatient endocrine evaluation and further workup. Will pursue transfer to [facility].  Patient and family agreeable.  Call made to [facility] transfer [employer] , details shared. Waiting fro acceptance

## 2023-04-21 NOTE — Plan of Care (Signed)

## 2023-04-21 NOTE — Progress Notes (Signed)
 Occupational Therapy Treatment Patient Details Name: Stacey [name] MRN: [id number] DOB: [birth year] Today's Date: 04/21/2023   History of present illness Pt is a 38 y.o. female admitted via ems as code stroke 4/13. MRI showed no acute changes, multiple chronic infarcts of L ACA/MCA. Pt found to be hypoglycemic PMH: CVA w/ residual R sided weakness & aphasia, HTN, DM, CKD   OT comments  Pt progressing well towards goals. Session today focused on ADLs. Pt requiring mod to total +2 assist for ADLs at bed level. Pt demonstrating improved sitting balance in bed. Recommendation of <3 hours of skilled rehab daily to optimize independence levels. Will continue to follow acutely.       If plan is discharge home, recommend the following:  Two people to help with bathing/dressing/bathroom;A lot of help with walking and/or transfers         Precautions / Restrictions Precautions Precautions: Fall Recall of Precautions/Restrictions: Impaired Restrictions Weight Bearing Restrictions Per Provider Order: No       Mobility Bed Mobility Overal bed mobility: Needs Assistance Bed Mobility: Rolling Rolling: Mod assist, Used rails        Transfers Overall transfer level: Needs assistance     Balance Overall balance assessment: Needs assistance, History of Falls Sitting-balance support: Feet supported Sitting balance-Leahy Scale: Fair           ADL either performed or assessed with clinical judgement   ADL Overall ADL's : Needs assistance/impaired         Upper Body Bathing: Maximal assistance;Bed level Upper Body Bathing Details (indicate cue type and reason): D/t bil UE deficits pt only able to bathe stomach Lower Body Bathing: Moderate assistance;Sitting/lateral leans;Bed level Lower Body Bathing Details (indicate cue type and reason): Pt able to bathe bil thighs, and R calf Upper Body Dressing : Moderate assistance Upper Body Dressing Details (indicate cue type and reason):  Assist to thread RUE Lower Body Dressing: Maximal assistance;Bed level;+2 for physical assistance Lower Body Dressing Details (indicate cue type and reason): once in reach pt able to reach and pull brief     Toileting- Clothing Manipulation and Hygiene: Total assistance;+2 for safety/equipment;Bed level Toileting - Clothing Manipulation Details (indicate cue type and reason): Pt incontinent, +2 for rolling       General ADL Comments: Per pt today session close to functional baseline    Extremity/Trunk Assessment Upper Extremity Assessment Upper Extremity Assessment: RUE deficits/detail;Generalized weakness;LUE deficits/detail RUE Deficits / Details: Hx of previous CVA, residual R sided weakness, Flexion contracture at elbow and fingers RUE Sensation: decreased light touch RUE Coordination: decreased fine motor;decreased gross motor LUE Deficits / Details: Decreased shoulder flex & abduction LUE: Shoulder pain with ROM LUE Sensation: decreased light touch LUE Coordination: decreased fine motor;decreased gross motor            Vision   Vision Assessment?: No apparent visual deficits         Communication Communication Communication: Impaired Factors Affecting Communication: Reduced clarity of speech;Difficulty expressing self   Cognition Arousal: Alert Behavior During Therapy: WFL for tasks assessed/performed Cognition: History of cognitive impairments             OT - Cognition Comments: Hx of previous CVA, delayed processing speed         Following commands: Impaired Following commands impaired: Follows one step commands with increased time      Cueing   Cueing Techniques: Verbal cues, Tactile cues        General Comments Pt incontinent  in bowel movement, NT assisted with perineal hygiene, skin tear noted on bottom RN notified    Pertinent Vitals/ Pain       Pain Assessment Pain Assessment: No/denies pain   Frequency  Min 2X/week         Progress Toward Goals  OT Goals(current goals can now be found in the care plan section)  Progress towards OT goals: Progressing toward goals  Acute Rehab OT Goals Patient Stated Goal: Unclear OT Goal Formulation: With patient/family Time For Goal Achievement: 05/02/23 Potential to Achieve Goals: Good ADL Goals Pt Will Perform Eating: with set-up;sitting Pt Will Perform Grooming: with set-up;sitting Pt Will Perform Upper Body Dressing: with set-up;sitting Pt Will Perform Lower Body Dressing: with mod assist;sit to/from stand  Plan         AM-PAC OT 6 Clicks Daily Activity     Outcome Measure   Help from another person eating meals?: A Little Help from another person taking care of personal grooming?: A Little Help from another person toileting, which includes using toliet, bedpan, or urinal?: Total Help from another person bathing (including washing, rinsing, drying)?: A Lot Help from another person to put on and taking off regular upper body clothing?: A Lot Help from another person to put on and taking off regular lower body clothing?: A Lot 6 Click Score: 13    End of Session    OT Visit Diagnosis: Unsteadiness on feet (R26.81);Other abnormalities of gait and mobility (R26.89);Repeated falls (R29.6);Muscle weakness (generalized) (M62.81);Hemiplegia and hemiparesis Hemiplegia - Right/Left: Right Hemiplegia - dominant/non-dominant: Dominant Hemiplegia - caused by:  (Previous CVA)   Activity Tolerance Patient tolerated treatment well   Patient Left in bed;with call bell/phone within reach;with bed alarm set   Nurse Communication Mobility status        Time: 8553-8478 OT Time Calculation (min): 35 min  Charges: OT General Charges $OT Visit: 1 Visit OT Treatments $Self Care/Home Management : 23-37 mins  Adrianne BROCKS, OT  Acute Rehabilitation Services Office [phone] Secure chat preferred   Adrianne GORMAN Savers 04/21/2023, 3:44 PM

## 2023-04-21 NOTE — Discharge Summary (Signed)
 Physician Discharge Summary  Stacey [name] UXL:[id number] DOB: 12/13/1985 DOA: 04/16/2023  PCP: Sheryl Donna, PA-C  Admit date: 04/16/2023 Discharge date: 04/21/2023  This is preliminary discharge summary in anticipation of transfer to another hospital.  May need to change or update at the time of transfer.  Admitted From: Home Disposition: Transfer to another hospital   Discharge Condition: Stable CODE STATUS: Full code Diet recommendation: Regular diet, encourage frequent meal intake  Discharge summary: Stacey [name] is a 38 y.o. 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   Initially patient arrived as code stroke and was evaluated by neurology, code stroke was canceled after presentation . This was felt due to hypoglycemia.  Prior to discharge home patient developed recurrent symptoms and hypoglycemia with CBG 48.  Patient was given second amp of D50 and started on D5-NS maintenance fluids.  The hospitalist service was consulted to admit.  Patient remained in the hospital due to hypoglycemia, unknown cause. Her initial serological tests consistent with insulinoma , transferring to tertiary care center with inpatient endocrinology and advance care.   Recurrent symptomatic hypoglycemia: Etiology is unclear. No previous history of hypoglycemia. Not on any medications or offending agents.  Denies any insulin  use, denies any herbal or over-the-counter medications. Patient would become hypoglycemic when taken off dextrose  infusion. Multiple test were done, initial tests were repeated with below findings.  4/17,  2 hours into fasting Peripheral blood glucose - 47,  Immediate labs Serum blood glucose, 58 Insulin  levels, 73 C-peptide, 20.5 Somatomedin C, 271 Beta hydroxybutyrate, 0.13    Glucagon  1 mg given after blood draw, 15 minutes blood glucose 73. Sulfonylurea panel still pending however patient is day 5 in the hospital now.   MRI abdomen poor quality but no obvious pancreatic tumor. Discussed case with outpatient endocrinologist. 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  Patient will get hypoglycemic when taken off dextrose .  Patient will need inpatient endocrine evaluation and further workup. Will pursue transfer to [facility].  Patient and family agreeable. Will keep on dextrose  infusion during transfer as well starting on sugar tablets. Encourage frequent snacking.   History of CVA with right-sided weakness and aphasia: Presented with worsening of her baseline residual stroke symptoms in setting of hypoglycemia. - Continue aspirin , Plavix , atorvastatin .  No evidence of new stroke. -Patient requiring more than usual support for mobility.  Will need rehab.  Repeat MRI was without evidence of new stroke.   AKI on CKD stage IIIb: Baseline creatinine about 1.9-2.  Also has history of acquired hyperparathyroidism. Renal function slightly worse today.  Encouraging oral intake.  Keeping on maintenance dextrose .  Continue to monitor levels. -Continue sodium bicarb tablets.   Hypertension: -Continue Lopressor  and ramipril .  Will hold ramipril  today due to worsening renal functions.    Normocytic anemia: Hemoglobin stable at baseline.    Hyperlipidemia: -Continue atorvastatin .   Depression/anxiety: -Continue sertraline  and Abilify .   Hyperparathyroidism - Continue outpatient follow up. She follows up with nephrology at [facility].    Mild cognitive impairment/intellectual disability: Patient does have history of cognitive impairment.  Also with medical comorbidities.  She lives at home with support from her mother.  Currently well established.  Patient will be transferred to [facility] when bed available.   Patient will continue on dextrose  infusion and oral sugar tablets on transfer.  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   ascorbic acid 500 MG tablet Commonly known as: VITAMIN C Take 500 mg by mouth daily.   aspirin  325 MG tablet Take 325 mg by mouth daily.   atorvastatin  80 MG tablet Commonly known as: LIPITOR  Take by mouth.   baclofen  10 MG tablet Commonly known as: LIORESAL  Take 10 mg by mouth 3 (three) times daily.   clopidogrel  75 MG tablet Commonly known as: PLAVIX  Take 75 mg by mouth daily. Patient taking ASA instead   dextrose  10 % infusion Inject 50 mL/hr into the vein continuous.   glucose 4 GM chewable tablet Chew 1 tablet (4 g total) by mouth every 6 (six) hours.   iron polysaccharides 150 MG capsule Commonly known as: NIFEREX Take 150 mg by mouth 2 (two) times daily.   metoprolol  tartrate 25 MG tablet Commonly known as: LOPRESSOR  Take 25 mg by mouth daily.   sertraline  50 MG tablet Commonly known as: ZOLOFT  Take 3 tablets (150 mg total) by mouth daily.   sodium bicarbonate  650 MG tablet Take 650 mg by mouth 2 (two) times daily.        Allergies  Allergen  Reactions   Metformin Diarrhea    Consultations: Endocrine, curbside phone consultation   Procedures/Studies: MR ABDOMEN MRCP W WO CONTAST Result Date: 04/19/2023 CLINICAL DATA:  Recurrent hypoglycemia. Evaluate for neuroendocrine tumor producing insulin . EXAM: MRI ABDOMEN WITHOUT AND WITH CONTRAST (INCLUDING MRCP) TECHNIQUE: Multiplanar multisequence MR imaging of the abdomen was performed both before and after the administration of intravenous contrast. Heavily T2-weighted images of the biliary and pancreatic ducts were obtained, and three-dimensional MRCP images were rendered by post processing. CONTRAST:  8mL GADAVIST  GADOBUTROL  1 MMOL/ML IV SOLN COMPARISON:  None Available. FINDINGS: Patient with altered mental status. Patient had difficulty following commands for MRI acquisition. Motion degradation of the study. Lower chest:  Lung bases are clear. Hepatobiliary: No focal hepatic lesion. Gallbladder normal. Common bile duct normal. No enhancing lesion within the liver parenchyma. The postcontrast T1 weighted imaging is degraded by patient motion. Pancreas: No lesion identified in the pancreas on T2 weighted imaging. No duct dilatation. Unfortunately, the T1 weighted imaging is severely degraded by respiratory motion and breathing motion. Patient could not follow commands. There is no gross enhancing lesion postcontrast T1 weighted imaging. Spleen: Normal spleen.  Small cysts within the spleen. Adrenals/urinary tract: Adrenal glands and kidneys are normal. Stomach/Bowel: Stomach and limited of the small bowel is unremarkable Vascular/Lymphatic: Abdominal aortic normal caliber. No retroperitoneal periportal lymphadenopathy. Musculoskeletal: No aggressive osseous lesion  IMPRESSION: 1. Unfortunately the postcontrast T1 weighted imaging is severely degraded by respiratory motion. Patient with altered mental status and difficulty following breathing instructions. There is no gross enhancing lesion the  pancreas although inadequately characterized. There is no lesion within the pancreas on T2 weighted imaging. CT with contrast would be a modality in patient with altered mental status. 2. Normal liver and biliary tree. Electronically Signed   By: Deboraha Fallow M.D.   On: 04/19/2023 20:00   DG Chest Portable 1 View Result Date: 04/16/2023 CLINICAL DATA:  Hypoglycemia EXAM: PORTABLE CHEST - 1 VIEW COMPARISON:  11/12/2021 FINDINGS: Lungs are clear. Heart size and mediastinal contours are within normal limits. No effusion. Visualized bones unremarkable. IMPRESSION: No acute cardiopulmonary disease. Electronically Signed   By: Nicoletta Barrier M.D.   On: 04/16/2023 19:53   CT HEAD CODE STROKE WO CONTRAST Result Date: 04/16/2023 CLINICAL DATA:  Code stroke. Neuro deficit, acute, stroke suspected. EXAM: CT HEAD WITHOUT CONTRAST TECHNIQUE: Contiguous axial images were obtained from the base of the skull through the vertex without intravenous contrast. RADIATION DOSE REDUCTION: This exam was performed according to the departmental dose-optimization program which includes automated exposure control, adjustment of the mA and/or kV according to patient size and/or use of iterative reconstruction technique. COMPARISON:  Head CT 04/14/2023 and MRI 04/15/2023 FINDINGS: Brain: There is no evidence of an acute infarct, intracranial hemorrhage, mass, midline shift, or extra-axial fluid collection. A large region of encephalomalacia is again noted anteriorly in the left frontal lobe involving the MCA and ACA territories with ex vacuo dilatation of the frontal horn of the left lateral ventricle. There are additional unchanged chronic infarcts in the right basal ganglia, left periatrial white matter, and left cerebellum with ex vacuo dilatation of the right frontal horn and left atrium. Multiple coarse calcifications are again noted in the brainstem. Vascular: No hyperdense vessel. Skull: No acute fracture or suspicious lesion.  Hyperostosis, asymmetrically prominent along the left frontal skull. Sinuses/Orbits: Unremarkable orbits. Visualized paranasal sinuses and mastoid air cells are clear. Other: None. ASPECTS ([place] Stroke Program Early CT Score) - Ganglionic level infarction (caudate, lentiform nuclei, internal capsule, insula, M1-M3 cortex): 7 - Supraganglionic infarction (M4-M6 cortex): 3 Total score (0-10 with 10 being normal): 10 These results were communicated to Dr. Alecia Ames at 6:55 pm on 04/16/2023 by text page via the [facility] messaging system. IMPRESSION: 1. No evidence of acute intracranial abnormality. ASPECTS of 10. 2. Unchanged chronic infarcts as above. Electronically Signed   By: Aundra Lee M.D.   On: 04/16/2023 18:55   MR BRAIN WO CONTRAST Result Date: 04/15/2023 CLINICAL DATA:  Initial evaluation for acute neuro deficit, stroke suspected. EXAM: MRI HEAD WITHOUT CONTRAST TECHNIQUE: Multiplanar, multiecho pulse sequences of the brain and surrounding structures were obtained without intravenous contrast. COMPARISON:  Prior CT from 04/14/2023. FINDINGS: Brain: Examination moderately degraded by motion artifact. Cerebral volume within normal limits. Patchy T2/FLAIR hyperintensity involving the periventricular and deep white matter both cerebral hemispheres, consistent with chronic small vessel ischemic disease. Encephalomalacia and gliosis involving the anterior left frontal lobe, likely related to prior infarct, left ACA and MCA distribution. Additional remote infarcts involving the right basal ganglia and left periatrial region. Small remote left cerebellar infarct. No convincing foci of restricted diffusion to suggest acute or subacute ischemia. Note made of apparent vague diffusion signal abnormality involving the right periatrial region on axial DWI sequence (series 2, image 27), felt to be consistent with artifact. Gray-white matter differentiation maintained. No acute intracranial hemorrhage. Minor  chronic  hemosiderin staining noted at the anterior left frontal region. No other significant chronic intracranial blood products. No mass lesion, midline shift or mass effect. No hydrocephalus or extra-axial fluid collection. Pituitary gland within normal limits. Vascular: Attenuated flow void within the left MCA distribution, in keeping with the chronic left MCA territory infarct. Major intracranial vascular flow voids are otherwise grossly maintained on this motion degraded exam. Skull and upper cervical spine: Craniocervical junction grossly within normal limits. Bone marrow signal intensity grossly normal. No scalp soft tissue abnormality. Sinuses/Orbits: Globes orbital soft tissues within normal limits. Paranasal sinuses are clear. No significant mastoid effusion. Other: None. IMPRESSION: 1. Motion degraded exam. 2. No acute intracranial abnormality. 3. Multiple chronic infarcts involving the left ACA/MCA distribution, right basal ganglia, left periatrial region, and left cerebellum. 4. Underlying chronic microvascular ischemic disease. Electronically Signed   By: Virgia Griffins M.D.   On: 04/15/2023 02:53   CT HEAD CODE STROKE WO CONTRAST Result Date: 04/14/2023 CLINICAL DATA:  Code stroke. EXAM: CT HEAD WITHOUT CONTRAST TECHNIQUE: Contiguous axial images were obtained from the base of the skull through the vertex without intravenous contrast. RADIATION DOSE REDUCTION: This exam was performed according to the departmental dose-optimization program which includes automated exposure control, adjustment of the mA and/or kV according to patient size and/or use of iterative reconstruction technique. COMPARISON:  Prior study from 07/06/2022. FINDINGS: Brain: Chronic encephalomalacia and gliosis involving the anterior left frontal lobe noted. Chronic ischemic infarcts at the right basal ganglia and left periatrial region. Few dystrophic calcifications present about the brainstem. Appearance is stable from prior. No  acute intracranial hemorrhage. No acute large vessel territory infarct. No mass lesion or midline shift. No hydrocephalus or extra-axial fluid collection. Vascular: No abnormal hyperdense vessel. Skull: Scalp soft tissues demonstrate no acute finding. Calvarium intact. Sinuses/Orbits: Left gaze noted. Paranasal sinuses are clear. No mastoid effusion. Other: None. ASPECTS [facility] Stroke Program Early CT Score) - Ganglionic level infarction (caudate, lentiform nuclei, internal capsule, insula, M1-M3 cortex): 7 - Supraganglionic infarction (M4-M6 cortex): 3 Total score (0-10 with 10 being normal): 10 IMPRESSION: 1. Stable head CT.  No acute intracranial abnormality. 2. ASPECTS is 10. 3. Multiple remote infarcts chronic ischemic changes with multiple remote infarcts involving the left greater than right cerebral hemispheres. These results were communicated to Dr. Murvin Arthurs at 11:33 pm on 04/14/2023 by text page via the [facility] messaging system. Electronically Signed   By: Virgia Griffins M.D.   On: 04/14/2023 23:36   (Echo, Carotid, EGD, Colonoscopy, ERCP)    Subjective: Seen in the morning rounds.  Mother trying to feed her.  She would respond, smiled and eat while this provider in the room.  She will decline food with mother.  Does have fluctuating mental status.  Blood sugars however are stable on the drip.   Discharge Exam: Vitals:   04/21/23 0352 04/21/23 0801  BP: 119/69 119/60  Pulse: 70 64  Resp: 16 16  Temp: 98.1 F (36.7 C) (!) 97.3 F (36.3 C)  SpO2: 100% 99%   Vitals:   04/20/23 1625 04/20/23 1957 04/21/23 0352 04/21/23 0801  BP: 109/83 119/87 119/69 119/60  Pulse: 73 71 70 64  Resp: 18 16 16 16   Temp: 98.3 F (36.8 C) 98.8 F (37.1 C) 98.1 F (36.7 C) (!) 97.3 F (36.3 C)  TempSrc: Oral Oral Oral Oral  SpO2: 99% 100% 100% 99%  Weight:        General: Alert, awake.  Difficult to evaluate for orientation  questions because of aphasia.  But looks comfortable and pleasant on  interaction.  Follows appropriate commands.  Pleasant interaction. Eyes: Pupils equal, reactive  Oral cavity: moist mucous membranes  Head: Atraumatic, normocephalic  Neck: supple  Chest: clear to auscultation. No crackles, no wheezes  CVS: S1,S2 RRR. No murmurs  Abd: No distention, soft, non-tender. No masses palpable  Extr: No edema   MSK: No joint deformities or swelling  Neurological: Right sided hemiplegia.  Aphasic.  The results of significant diagnostics from this hospitalization (including imaging, microbiology, ancillary and laboratory) are listed below for reference.     Microbiology: No results found for this or any previous visit (from the past 240 hours).   Labs: BNP (last 3 results) No results for input(s): "BNP" in the last 8760 hours. Basic Metabolic Panel: Recent Labs  Lab 04/17/23 0552 04/18/23 0600 04/19/23 0448 04/20/23 0523 04/20/23 1136 04/20/23 1502 04/21/23 0515  NA 138 136 135 136  --   --  137  K 4.7 4.3 4.3 4.6  --   --  4.7  CL 109 106 105 110  --   --  110  CO2 18* 18* 17* 18*  --   --  18*  GLUCOSE 78 87 98 96 174* 58* 145*  BUN 22* 16 23* 39*  --   --  55*  CREATININE 1.59* 1.57* 1.88* 1.95*  --   --  2.19*  CALCIUM  8.8* 9.0 8.7* 8.7*  --   --  8.7*   Liver Function Tests: Recent Labs  Lab 04/14/23 2330 04/16/23 1835  AST 15 14*  ALT 16 17  ALKPHOS 91 102  BILITOT 0.4 0.5  PROT 7.5 7.6  ALBUMIN 3.1* 3.2*   No results for input(s): "LIPASE", "AMYLASE" in the last 168 hours. No results for input(s): "AMMONIA" in the last 168 hours. CBC: Recent Labs  Lab 04/14/23 2330 04/14/23 2338 04/16/23 1835 04/16/23 1845 04/17/23 0552  WBC 12.4*  --  13.4*  --  10.6*  NEUTROABS 5.6  --  6.4  --   --   HGB 11.4* 12.6 11.3* 12.6 11.6*  HCT 36.3 37.0 36.8 37.0 36.4  MCV 86.0  --  87.6  --  85.0  PLT 288  --  296  --  282   Cardiac Enzymes: No results for input(s): "CKTOTAL", "CKMB", "CKMBINDEX", "TROPONINI" in the last 168  hours. BNP: Invalid input(s): "POCBNP" CBG: Recent Labs  Lab 04/21/23 0156 04/21/23 0349 04/21/23 0759 04/21/23 1033 04/21/23 1234  GLUCAP 167* 179* 126* 164* 128*   D-Dimer No results for input(s): "DDIMER" in the last 72 hours. Hgb A1c No results for input(s): "HGBA1C" in the last 72 hours. Lipid Profile No results for input(s): "CHOL", "HDL", "LDLCALC", "TRIG", "CHOLHDL", "LDLDIRECT" in the last 72 hours. Thyroid function studies No results for input(s): "TSH", "T4TOTAL", "T3FREE", "THYROIDAB" in the last 72 hours.  Invalid input(s): "FREET3" Anemia work up No results for input(s): "VITAMINB12", "FOLATE", "FERRITIN", "TIBC", "IRON", "RETICCTPCT" in the last 72 hours. Urinalysis    Component Value Date/Time   COLORURINE STRAW (A) 04/16/2023 2056   APPEARANCEUR CLEAR 04/16/2023 2056   APPEARANCEUR Clear 11/10/2011 1139   LABSPEC 1.008 04/16/2023 2056   LABSPEC 1.010 11/10/2011 1139   PHURINE 7.0 04/16/2023 2056   GLUCOSEU NEGATIVE 04/16/2023 2056   GLUCOSEU Negative 11/10/2011 1139   HGBUR NEGATIVE 04/16/2023 2056   BILIRUBINUR NEGATIVE 04/16/2023 2056   BILIRUBINUR Negative 11/10/2011 1139   KETONESUR NEGATIVE 04/16/2023 2056   PROTEINUR 100 (  A) 04/16/2023 2056   NITRITE NEGATIVE 04/16/2023 2056   LEUKOCYTESUR NEGATIVE 04/16/2023 2056   LEUKOCYTESUR Negative 11/10/2011 1139   Sepsis Labs Recent Labs  Lab 04/14/23 2330 04/16/23 1835 04/17/23 0552  WBC 12.4* 13.4* 10.6*   Microbiology No results found for this or any previous visit (from the past 240 hours).   Time coordinating discharge:  55 minutes  SIGNED:   Vada Garibaldi, MD  [facility] 04/21/2023, 1:45 PM

## 2023-04-22 DIAGNOSIS — E162 Hypoglycemia, unspecified: Secondary | ICD-10-CM | POA: Diagnosis not present

## 2023-04-22 LAB — BASIC METABOLIC PANEL WITH GFR
Anion gap: 16 — ABNORMAL HIGH (ref 5–15)
BUN: 48 mg/dL — ABNORMAL HIGH (ref 6–20)
CO2: 14 mmol/L — ABNORMAL LOW (ref 22–32)
Calcium: 8.5 mg/dL — ABNORMAL LOW (ref 8.9–10.3)
Chloride: 107 mmol/L (ref 98–111)
Creatinine, Ser: 2 mg/dL — ABNORMAL HIGH (ref 0.44–1.00)
GFR, Estimated: 32 mL/min — ABNORMAL LOW (ref >60)
Glucose, Bld: 104 mg/dL — ABNORMAL HIGH (ref 70–99)
Potassium: 5.4 mmol/L — ABNORMAL HIGH (ref 3.5–5.1)
Sodium: 137 mmol/L (ref 135–145)

## 2023-04-22 LAB — GLUCOSE, CAPILLARY
Glucose-Capillary: 110 mg/dL — ABNORMAL HIGH (ref 70–99)
Glucose-Capillary: 111 mg/dL — ABNORMAL HIGH (ref 70–99)
Glucose-Capillary: 112 mg/dL — ABNORMAL HIGH (ref 70–99)
Glucose-Capillary: 113 mg/dL — ABNORMAL HIGH (ref 70–99)
Glucose-Capillary: 114 mg/dL — ABNORMAL HIGH (ref 70–99)
Glucose-Capillary: 122 mg/dL — ABNORMAL HIGH (ref 70–99)
Glucose-Capillary: 129 mg/dL — ABNORMAL HIGH (ref 70–99)
Glucose-Capillary: 133 mg/dL — ABNORMAL HIGH (ref 70–99)
Glucose-Capillary: 144 mg/dL — ABNORMAL HIGH (ref 70–99)
Glucose-Capillary: 93 mg/dL (ref 70–99)

## 2023-04-22 NOTE — Progress Notes (Signed)
 Pt transferred to [facility] this afternoon in safe and stable condition. Pt left with IV site intact and infusing D10, alert and at baseline mental status. Pt had bm this shift, incontinent of urine but voiding appropriately. Pt tolerating diet and fluids without difficulty and was assisted to eat. Pt transferred to chair for sometime and required 2 person max assist to stand and pivot with walker to transfer. Pt reported no pain, blood sugars remained stable this shift. Report given to receiving RN and MD notified of pt's departure.

## 2023-04-22 NOTE — Progress Notes (Signed)
   04/22/23 0854  Mobility  Activity Transferred from bed to chair  Level of Assistance +2 (takes two people) (ModA+2 to [employer])  Assistive Device Cane [employer])  Activity Response Tolerated fair  Mobility Referral Yes  Mobility visit 1 Mobility  Mobility Specialist Start Time (ACUTE ONLY) [id number]  Mobility Specialist Stop Time (ACUTE ONLY) 0914  Mobility Specialist Time Calculation (min) (ACUTE ONLY) 20 min   Mobility Specialist: Progress Note  Pt agreeable to mobility session - received in bed. C/o pain in R arm/ hand throughout - RN notified and aware. Pt needed heavy directional cues, knee buckling causing heavier assist. Pt with fatigue just before sitting in chair. Returned to chair with all needs met - call bell within reach. NT present.   Isla Mari, BS Mobility Specialist Please contact via SecureChat or  Rehab office at [phone].

## 2023-04-22 NOTE — Plan of Care (Signed)

## 2023-04-22 NOTE — Progress Notes (Signed)
 PROGRESS NOTE  Stacey [name]:[id number] DOB: [birth year] DOA: 04/16/2023 PCP: Sheryl Donna, PA-C   LOS: 4 days   Brief narrative:  Stacey [name] is a 38 y.o. 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  Initially arrived as code stroke and evaluated by neurology.  Due to persistent hypoglycemia patient was admitted to hospital.    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  Patient would be hypoglycemic when taken off dextrose  infusion and is dependent on it right now.  2 hours into fasting Peripheral blood glucose - 47,  Immediate labs Serum blood glucose, 58 Insulin  levels, 73 C-peptide, 20.5 Somatomedin C, 271 Beta hydroxybutyrate, 0.13    MRI abdomen was done which was poor quality but no obvious pancreatic tumor.  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  Gets hypoglycemic when taken off dextrose .  Will need inpatient endocrine evaluation and further workup so transfer process is underway for [facility].  Will need dextrose  infusion during transfer including sugar tablets and frequent snacking.   History of CVA with right-sided weakness and aphasia: - Continue aspirin , Plavix , atorvastatin .  No evidence of new stroke.  Repeat MRI of the brain without any new stroke.  Will need rehabilitation.  AKI on CKD stage IIIb: Baseline creatinine about 1.9-2.  Creatinine today at 2.0.   Encourage oral hydration.  Continue sodium bicarbonate  tablets.  Also has history of acquired hyperparathyroidism.   Hypertension: Continue Lopressor .  Ramipril  hold.  Latest blood pressure 113/86.   Normocytic anemia: Hemoglobin stable at baseline.  Will check CBC in AM.   Hyperlipidemia: -Continue atorvastatin .   Depression/anxiety: -Continue sertraline  and Abilify .   Hyperparathyroidism - Continue outpatient follow up. She follows up with nephrology at [facility].    Mild cognitive impairment/intellectual disability: Patient does have history of cognitive impairment.  Also with medical comorbidities.  She lives at home with support from her mother.    DVT prophylaxis: heparin  injection 5,000 Units Start: 04/17/23 0600   Disposition: Awaiting transfer to [facility].  Status is: Inpatient Remains inpatient appropriate because: Persistent hypoglycemia on IV dextrose  infusion, need for tertiary care transfer.    Code Status:     Code Status: Full Code  Family Communication: None at bedside  Consultants: Endocrinology- as outpatient verbal opinion by previous provider  Procedures: None  Anti-infectives:  None  Anti-infectives (From admission, onward)    None        Subjective: Today, patient was seen and examined at bedside.  Patient denies any dizziness lightheadedness sweating shortness of breath dyspnea.  Eating with support.  Objective: Vitals:   04/21/23 1659 04/22/23 0525  BP: 126/75 113/86  Pulse: 66 (!) 107  Resp: 16 17  Temp: 98.4 F (36.9 C) 97.8 F (36.6 C)  SpO2: 100% 92%    Intake/Output Summary (Last 24 hours) at 04/22/2023 1033 Last data filed at 04/22/2023 0941 Gross per 24 hour  Intake 240 ml  Output --  Net 240 ml   [employer]  04/16/23 1838  Weight: 84.2 kg   Body mass index is 29.07 kg/m.   Physical Exam: GENERAL: Patient is alert awake and oriented. Not in obvious distress.  Communicative.  Has  aphasia. HENT: No scleral pallor or icterus. Pupils equally reactive to light. Oral mucosa is moist NECK: is supple, no gross swelling noted. CHEST: Clear to auscultation. No crackles or wheezes.  Diminished breath sounds bilaterally. CVS: S1 and S2 heard, no murmur. Regular rate and rhythm.  ABDOMEN: Soft, non-tender, bowel sounds are present. EXTREMITIES: No edema. CNS: Communicative, aphasia, right-sided weakness, SKIN: warm and dry without rashes.  Data Review: I have personally reviewed the following laboratory data and studies,  CBC: Recent Labs  Lab 04/16/23 1835 04/16/23 1845 04/17/23 0552  WBC 13.4*  --  10.6*  NEUTROABS 6.4  --   --   HGB 11.3* 12.6 11.6*  HCT 36.8 37.0 36.4  MCV 87.6  --  85.0  PLT 296  --  282   Basic Metabolic Panel: Recent Labs  Lab 04/18/23 0600 04/19/23 0448 04/20/23 0523 04/20/23 1136 04/20/23 1502 04/21/23 0515 04/22/23 0415  NA 136 135 136  --   --  137 137  K 4.3 4.3 4.6  --   --  4.7 5.4*  CL 106 105 110  --   --  110 107  CO2 18* 17* 18*  --   --  18* 14*  GLUCOSE 87 98 96 174* 58* 145* 104*  BUN 16 23* 39*  --   --  55* 48*  CREATININE 1.57* 1.88* 1.95*  --   --  2.19* 2.00*  CALCIUM  9.0 8.7* 8.7*  --   --  8.7* 8.5*   Liver Function Tests: Recent Labs  Lab 04/16/23 1835  AST 14*  ALT 17  ALKPHOS 102  BILITOT 0.5  PROT 7.6  ALBUMIN 3.2*   No results for input(s): "LIPASE", "AMYLASE" in the last 168 hours. No results for input(s): "AMMONIA" in the last 168 hours. Cardiac Enzymes: No results for input(s): "CKTOTAL", "CKMB", "CKMBINDEX", "TROPONINI" in the last 168 hours. BNP (last 3 results) No results for input(s): "BNP" in the last 8760 hours.  ProBNP (last 3 results) No results for input(s): "PROBNP" in the last 8760 hours.  CBG: Recent Labs  Lab 04/22/23 0159 04/22/23 0359 04/22/23 0609 04/22/23 0804 04/22/23 0945  GLUCAP 129* 111* 133* 113* 144*   No results found for this or any previous visit (from  the past 240 hours).   Studies: No results found.    Mervyn Pflaum, MD  [facility] 04/22/2023  If 7PM-7AM, please contact night-coverage

## 2023-04-22 NOTE — Discharge Summary (Signed)
 Physician Discharge Summary  Stacey [name] UJW:[id number] DOB: 29-Aug-1985 DOA: 04/16/2023  PCP: Sheryl Donna, PA-C  Admit date: 04/16/2023 Discharge date: 04/22/2023    Admitted From: Home Disposition: Transfer to another hospital   Discharge Condition: Stable CODE STATUS: Full code Diet recommendation: Regular diet, encourage frequent meal intake  Discharge summary: Stacey [name] is a 38 y.o. 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   Initially patient arrived as code stroke and was evaluated by neurology, code stroke was canceled after presentation . This was felt due to hypoglycemia.  Prior to discharge home patient developed recurrent symptoms and hypoglycemia with CBG 48.  Patient was given second amp of D50 and started on D5-NS maintenance fluids.  The hospitalist service was consulted to admit.  Patient remained in the hospital due to hypoglycemia, unknown cause. Her initial serological tests consistent with insulinoma , transferring to tertiary care center with inpatient endocrinology and advance care.   Recurrent symptomatic hypoglycemia: Etiology is unclear. No previous history of hypoglycemia. Not on any medications or offending agents.  Denies any insulin  use, denies any herbal or over-the-counter medications. Patient would become hypoglycemic when taken off dextrose  infusion. Multiple test were done, initial tests were repeated with below findings.  4/17,  2 hours into fasting Peripheral blood glucose - 47,  Immediate labs Serum blood glucose, 58 Insulin  levels, 73 C-peptide, 20.5 Somatomedin C, 271 Beta hydroxybutyrate, 0.13   Glucagon  1 mg given after blood draw, 15 minutes blood glucose 73. Sulfonylurea panel still pending however patient is day 5 in the  hospital now.   MRI abdomen poor quality but no obvious pancreatic tumor. Discussed case with outpatient endocrinologist. 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  Patient will get hypoglycemic when taken off dextrose .  Patient will need inpatient endocrine evaluation and further workup. Will pursue transfer to [facility].  Patient and family agreeable. Will keep on dextrose  infusion during transfer as well starting on sugar tablets. Encourage frequent snacking.   History of CVA with right-sided weakness and aphasia: Presented with worsening of her baseline residual stroke symptoms in setting of hypoglycemia. - Continue aspirin , Plavix , atorvastatin .  No evidence of new stroke. -Patient requiring more than usual support for mobility.  Will need rehab.  Repeat MRI was without evidence of new stroke.   AKI on CKD stage IIIb: Baseline creatinine about 1.9-2.  Also has history of acquired hyperparathyroidism. Renal function slightly worse today.  Encouraging oral intake.  Keeping on maintenance dextrose .  Continue to monitor levels. -Continue sodium bicarb tablets.   Hypertension: -Continue Lopressor  and ramipril .  Will hold ramipril  today due to worsening renal functions.    Normocytic anemia: Hemoglobin stable at baseline.    Hyperlipidemia: -Continue atorvastatin .   Depression/anxiety: -Continue sertraline  and Abilify .   Hyperparathyroidism - Continue outpatient follow up. She follows up with nephrology at [facility].    Mild cognitive impairment/intellectual disability: Patient does have history of cognitive impairment.  Also with medical comorbidities.  She lives at home with support from her mother.  Currently well established.  Patient will be transferred to [facility] when bed available.  Patient will continue on dextrose  infusion and oral sugar tablets on transfer.  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   ascorbic acid 500 MG tablet Commonly known as: VITAMIN C Take 500 mg by mouth daily.   aspirin  325 MG tablet Take 325 mg by mouth daily.   atorvastatin  80 MG tablet Commonly known as: LIPITOR  Take by mouth.   baclofen  10 MG tablet Commonly known as: LIORESAL  Take 10 mg by mouth 3 (three) times daily.   clopidogrel  75 MG tablet Commonly known as: PLAVIX  Take 75 mg by mouth daily. Patient taking ASA instead   dextrose  10 % infusion Inject 50 mL/hr into the vein continuous.   glucose 4 GM chewable tablet Chew 1 tablet (4 g total) by mouth every 6 (six) hours.   iron polysaccharides 150 MG capsule Commonly known as: NIFEREX Take 150 mg by mouth 2 (two) times daily.   metoprolol  tartrate 25 MG tablet Commonly known as: LOPRESSOR  Take 25 mg by mouth daily.   sertraline  50 MG tablet Commonly known as: ZOLOFT  Take 3 tablets (150 mg total) by mouth daily.   sodium bicarbonate  650 MG tablet Take 650 mg by mouth 2 (two) times daily.        Allergies  Allergen Reactions   Metformin Diarrhea    Consultations: Endocrine, curbside phone consultation   Procedures/Studies: MR ABDOMEN  MRCP W WO CONTAST Result Date: 04/19/2023 CLINICAL DATA:  Recurrent hypoglycemia. Evaluate for neuroendocrine tumor producing insulin . EXAM: MRI ABDOMEN WITHOUT AND WITH CONTRAST (INCLUDING MRCP) TECHNIQUE: Multiplanar multisequence MR imaging of the abdomen was performed both before and after the administration of intravenous contrast. Heavily T2-weighted images of the biliary and pancreatic ducts were obtained, and three-dimensional MRCP images were rendered by post processing. CONTRAST:  8mL GADAVIST  GADOBUTROL  1 MMOL/ML IV SOLN COMPARISON:  None Available. FINDINGS: Patient with altered mental status. Patient had difficulty following commands for MRI acquisition. Motion degradation of the study. Lower chest:  Lung bases are clear. Hepatobiliary: No focal hepatic lesion. Gallbladder normal. Common bile duct normal. No enhancing lesion within the liver parenchyma. The postcontrast T1 weighted imaging is degraded by patient motion. Pancreas: No lesion identified in the pancreas on T2 weighted imaging. No duct dilatation. Unfortunately, the T1 weighted imaging is severely degraded by respiratory motion and breathing motion. Patient could not follow commands. There is no gross enhancing lesion postcontrast T1 weighted imaging. Spleen: Normal spleen.  Small cysts within the spleen. Adrenals/urinary tract: Adrenal glands and kidneys are normal. Stomach/Bowel: Stomach and limited of the small bowel is unremarkable Vascular/Lymphatic: Abdominal aortic normal caliber. No retroperitoneal periportal lymphadenopathy. Musculoskeletal: No aggressive osseous lesion IMPRESSION: 1. Unfortunately the postcontrast T1 weighted imaging is severely degraded by respiratory motion. Patient with altered mental status and difficulty following  breathing instructions. There is no gross enhancing lesion the pancreas although inadequately characterized. There is no lesion within the pancreas on T2 weighted imaging. CT with contrast would be  a modality in patient with altered mental status. 2. Normal liver and biliary tree. Electronically Signed   By: Deboraha Fallow M.D.   On: 04/19/2023 20:00   DG Chest Portable 1 View Result Date: 04/16/2023 CLINICAL DATA:  Hypoglycemia EXAM: PORTABLE CHEST - 1 VIEW COMPARISON:  11/12/2021 FINDINGS: Lungs are clear. Heart size and mediastinal contours are within normal limits. No effusion. Visualized bones unremarkable. IMPRESSION: No acute cardiopulmonary disease. Electronically Signed   By: Nicoletta Barrier M.D.   On: 04/16/2023 19:53   CT HEAD CODE STROKE WO CONTRAST Result Date: 04/16/2023 CLINICAL DATA:  Code stroke. Neuro deficit, acute, stroke suspected. EXAM: CT HEAD WITHOUT CONTRAST TECHNIQUE: Contiguous axial images were obtained from the base of the skull through the vertex without intravenous contrast. RADIATION DOSE REDUCTION: This exam was performed according to the departmental dose-optimization program which includes automated exposure control, adjustment of the mA and/or kV according to patient size and/or use of iterative reconstruction technique. COMPARISON:  Head CT 04/14/2023 and MRI 04/15/2023 FINDINGS: Brain: There is no evidence of an acute infarct, intracranial hemorrhage, mass, midline shift, or extra-axial fluid collection. A large region of encephalomalacia is again noted anteriorly in the left frontal lobe involving the MCA and ACA territories with ex vacuo dilatation of the frontal horn of the left lateral ventricle. There are additional unchanged chronic infarcts in the right basal ganglia, left periatrial white matter, and left cerebellum with ex vacuo dilatation of the right frontal horn and left atrium. Multiple coarse calcifications are again noted in the brainstem. Vascular: No hyperdense vessel. Skull: No acute fracture or suspicious lesion. Hyperostosis, asymmetrically prominent along the left frontal skull. Sinuses/Orbits: Unremarkable orbits. Visualized paranasal sinuses and  mastoid air cells are clear. Other: None. ASPECTS ([place] Stroke Program Early CT Score) - Ganglionic level infarction (caudate, lentiform nuclei, internal capsule, insula, M1-M3 cortex): 7 - Supraganglionic infarction (M4-M6 cortex): 3 Total score (0-10 with 10 being normal): 10 These results were communicated to Dr. Alecia Ames at 6:55 pm on 04/16/2023 by text page via the [facility] messaging system. IMPRESSION: 1. No evidence of acute intracranial abnormality. ASPECTS of 10. 2. Unchanged chronic infarcts as above. Electronically Signed   By: Aundra Lee M.D.   On: 04/16/2023 18:55   MR BRAIN WO CONTRAST Result Date: 04/15/2023 CLINICAL DATA:  Initial evaluation for acute neuro deficit, stroke suspected. EXAM: MRI HEAD WITHOUT CONTRAST TECHNIQUE: Multiplanar, multiecho pulse sequences of the brain and surrounding structures were obtained without intravenous contrast. COMPARISON:  Prior CT from 04/14/2023. FINDINGS: Brain: Examination moderately degraded by motion artifact. Cerebral volume within normal limits. Patchy T2/FLAIR hyperintensity involving the periventricular and deep white matter both cerebral hemispheres, consistent with chronic small vessel ischemic disease. Encephalomalacia and gliosis involving the anterior left frontal lobe, likely related to prior infarct, left ACA and MCA distribution. Additional remote infarcts involving the right basal ganglia and left periatrial region. Small remote left cerebellar infarct. No convincing foci of restricted diffusion to suggest acute or subacute ischemia. Note made of apparent vague diffusion signal abnormality involving the right periatrial region on axial DWI sequence (series 2, image 27), felt to be consistent with artifact. Gray-white matter differentiation maintained. No acute intracranial hemorrhage. Minor chronic hemosiderin staining noted at the anterior left frontal region. No other significant chronic intracranial blood products. No mass lesion,  midline  shift or mass effect. No hydrocephalus or extra-axial fluid collection. Pituitary gland within normal limits. Vascular: Attenuated flow void within the left MCA distribution, in keeping with the chronic left MCA territory infarct. Major intracranial vascular flow voids are otherwise grossly maintained on this motion degraded exam. Skull and upper cervical spine: Craniocervical junction grossly within normal limits. Bone marrow signal intensity grossly normal. No scalp soft tissue abnormality. Sinuses/Orbits: Globes orbital soft tissues within normal limits. Paranasal sinuses are clear. No significant mastoid effusion. Other: None. IMPRESSION: 1. Motion degraded exam. 2. No acute intracranial abnormality. 3. Multiple chronic infarcts involving the left ACA/MCA distribution, right basal ganglia, left periatrial region, and left cerebellum. 4. Underlying chronic microvascular ischemic disease. Electronically Signed   By: Virgia Griffins M.D.   On: 04/15/2023 02:53   CT HEAD CODE STROKE WO CONTRAST Result Date: 04/14/2023 CLINICAL DATA:  Code stroke. EXAM: CT HEAD WITHOUT CONTRAST TECHNIQUE: Contiguous axial images were obtained from the base of the skull through the vertex without intravenous contrast. RADIATION DOSE REDUCTION: This exam was performed according to the departmental dose-optimization program which includes automated exposure control, adjustment of the mA and/or kV according to patient size and/or use of iterative reconstruction technique. COMPARISON:  Prior study from 07/06/2022. FINDINGS: Brain: Chronic encephalomalacia and gliosis involving the anterior left frontal lobe noted. Chronic ischemic infarcts at the right basal ganglia and left periatrial region. Few dystrophic calcifications present about the brainstem. Appearance is stable from prior. No acute intracranial hemorrhage. No acute large vessel territory infarct. No mass lesion or midline shift. No hydrocephalus or extra-axial  fluid collection. Vascular: No abnormal hyperdense vessel. Skull: Scalp soft tissues demonstrate no acute finding. Calvarium intact. Sinuses/Orbits: Left gaze noted. Paranasal sinuses are clear. No mastoid effusion. Other: None. ASPECTS [facility] Stroke Program Early CT Score) - Ganglionic level infarction (caudate, lentiform nuclei, internal capsule, insula, M1-M3 cortex): 7 - Supraganglionic infarction (M4-M6 cortex): 3 Total score (0-10 with 10 being normal): 10 IMPRESSION: 1. Stable head CT.  No acute intracranial abnormality. 2. ASPECTS is 10. 3. Multiple remote infarcts chronic ischemic changes with multiple remote infarcts involving the left greater than right cerebral hemispheres. These results were communicated to Dr. Murvin Arthurs at 11:33 pm on 04/14/2023 by text page via the [facility] messaging system. Electronically Signed   By: Virgia Griffins M.D.   On: 04/14/2023 23:36   (Echo, Carotid, EGD, Colonoscopy, ERCP)    Subjective: Today, patient was seen and examined at bedside.  Patient denies any dizziness lightheadedness sweating shortness of breath dyspnea.  Eating with support.   Discharge Exam: Vitals:   04/22/23 0525 04/22/23 1425  BP: 113/86 126/82  Pulse: (!) 107   Resp: 17 17  Temp: 97.8 F (36.6 C) 97.8 F (36.6 C)  SpO2: 92% 98%   Vitals:   04/21/23 0801 04/21/23 1659 04/22/23 0525 04/22/23 1425  BP: 119/60 126/75 113/86 126/82  Pulse: 64 66 (!) 107   Resp: 16 16 17 17   Temp: (!) 97.3 F (36.3 C) 98.4 F (36.9 C) 97.8 F (36.6 C) 97.8 F (36.6 C)  TempSrc: Oral Oral Axillary   SpO2: 99% 100% 92% 98%  Weight:       GENERAL: Patient is alert awake and oriented. Not in obvious distress.  Communicative.  Has aphasia. HENT: No scleral pallor or icterus. Pupils equally reactive to light. Oral mucosa is moist NECK: is supple, no gross swelling noted. CHEST: Clear to auscultation. No crackles or wheezes.  Diminished breath sounds bilaterally. CVS: S1 and  S2 heard, no  murmur. Regular rate and rhythm.  ABDOMEN: Soft, non-tender, bowel sounds are present. EXTREMITIES: No edema. CNS: Communicative, aphasia, right-sided weakness, SKIN: warm and dry without rashes  The results of significant diagnostics from this hospitalization (including imaging, microbiology, ancillary and laboratory) are listed below for reference.     Microbiology: No results found for this or any previous visit (from the past 240 hours).   Labs: BNP (last 3 results) No results for input(s): "BNP" in the last 8760 hours. Basic Metabolic Panel: Recent Labs  Lab 04/18/23 0600 04/19/23 0448 04/20/23 0523 04/20/23 1136 04/20/23 1502 04/21/23 0515 04/22/23 0415  NA 136 135 136  --   --  137 137  K 4.3 4.3 4.6  --   --  4.7 5.4*  CL 106 105 110  --   --  110 107  CO2 18* 17* 18*  --   --  18* 14*  GLUCOSE 87 98 96 174* 58* 145* 104*  BUN 16 23* 39*  --   --  55* 48*  CREATININE 1.57* 1.88* 1.95*  --   --  2.19* 2.00*  CALCIUM  9.0 8.7* 8.7*  --   --  8.7* 8.5*   Liver Function Tests: Recent Labs  Lab 04/16/23 1835  AST 14*  ALT 17  ALKPHOS 102  BILITOT 0.5  PROT 7.6  ALBUMIN 3.2*   No results for input(s): "LIPASE", "AMYLASE" in the last 168 hours. No results for input(s): "AMMONIA" in the last 168 hours. CBC: Recent Labs  Lab 04/16/23 1835 04/16/23 1845 04/17/23 0552  WBC 13.4*  --  10.6*  NEUTROABS 6.4  --   --   HGB 11.3* 12.6 11.6*  HCT 36.8 37.0 36.4  MCV 87.6  --  85.0  PLT 296  --  282   Cardiac Enzymes: No results for input(s): "CKTOTAL", "CKMB", "CKMBINDEX", "TROPONINI" in the last 168 hours. BNP: Invalid input(s): "POCBNP" CBG: Recent Labs  Lab 04/22/23 0609 04/22/23 0804 04/22/23 0945 04/22/23 1215 04/22/23 1428  GLUCAP 133* 113* 144* 122* 110*   D-Dimer No results for input(s): "DDIMER" in the last 72 hours. Hgb A1c No results for input(s): "HGBA1C" in the last 72 hours. Lipid Profile No results for input(s): "CHOL", "HDL",  "LDLCALC", "TRIG", "CHOLHDL", "LDLDIRECT" in the last 72 hours. Thyroid function studies No results for input(s): "TSH", "T4TOTAL", "T3FREE", "THYROIDAB" in the last 72 hours.  Invalid input(s): "FREET3" Anemia work up No results for input(s): "VITAMINB12", "FOLATE", "FERRITIN", "TIBC", "IRON", "RETICCTPCT" in the last 72 hours. Urinalysis    Component Value Date/Time   COLORURINE STRAW (A) 04/16/2023 2056   APPEARANCEUR CLEAR 04/16/2023 2056   APPEARANCEUR Clear 11/10/2011 1139   LABSPEC 1.008 04/16/2023 2056   LABSPEC 1.010 11/10/2011 1139   PHURINE 7.0 04/16/2023 2056   GLUCOSEU NEGATIVE 04/16/2023 2056   GLUCOSEU Negative 11/10/2011 1139   HGBUR NEGATIVE 04/16/2023 2056   BILIRUBINUR NEGATIVE 04/16/2023 2056   BILIRUBINUR Negative 11/10/2011 1139   KETONESUR NEGATIVE 04/16/2023 2056   PROTEINUR 100 (A) 04/16/2023 2056   NITRITE NEGATIVE 04/16/2023 2056   LEUKOCYTESUR NEGATIVE 04/16/2023 2056   LEUKOCYTESUR Negative 11/10/2011 1139   Sepsis Labs Recent Labs  Lab 04/16/23 1835 04/17/23 0552  WBC 13.4* 10.6*   Microbiology No results found for this or any previous visit (from the past 240 hours).   Time coordinating discharge:  55 minutes  SIGNED:   Rosena Conradi, MD  [facility] 04/22/2023, 3:19 PM

## 2023-04-25 ENCOUNTER — Encounter: Payer: MEDICAID | Admitting: Occupational Therapy

## 2023-04-27 ENCOUNTER — Encounter: Payer: MEDICAID | Admitting: Occupational Therapy

## 2023-04-28 ENCOUNTER — Encounter: Payer: Self-pay | Admitting: Neurology

## 2023-04-28 ENCOUNTER — Ambulatory Visit: Payer: MEDICAID | Admitting: Neurology

## 2023-04-28 LAB — SULFONYLUREA HYPOGLYCEMICS PANEL, SERUM
Acetohexamide: NEGATIVE ug/mL (ref 20–60)
Chlorpropamide: NEGATIVE ug/mL (ref 75–250)
Glimepiride: NEGATIVE ng/mL (ref 80–250)
Glipizide: NEGATIVE ng/mL (ref 200–1000)
Glyburide: NEGATIVE ng/mL
Nateglinide: NEGATIVE ng/mL
Repaglinide: NEGATIVE ng/mL
Tolazamide: NEGATIVE ug/mL
Tolbutamide: NEGATIVE ug/mL (ref 40–100)

## 2023-05-01 LAB — PROINSULIN/INSULIN RATIO
Insulin: 24 u[IU]/mL — ABNORMAL HIGH
Insulin: 32 u[IU]/mL — ABNORMAL HIGH
Proinsulin: 20.7 pmol/L — ABNORMAL HIGH
Proinsulin: 61.8 pmol/L — ABNORMAL HIGH

## 2023-05-02 ENCOUNTER — Encounter: Payer: MEDICAID | Admitting: Occupational Therapy

## 2023-05-04 ENCOUNTER — Encounter: Payer: MEDICAID | Admitting: Occupational Therapy

## 2023-05-08 ENCOUNTER — Encounter: Payer: Self-pay | Admitting: Occupational Therapy

## 2023-05-08 DIAGNOSIS — M6281 Muscle weakness (generalized): Secondary | ICD-10-CM

## 2023-05-08 NOTE — Therapy (Signed)
 [facility] [address] [place], [place], [address] Phone: [phone]   Fax:  [phone]  Patient Details  Name: Stacey [name] MRN: [id number] Date of Birth: 1985-08-20  OCCUPATIONAL THERAPY DISCHARGE SUMMARY  Visits from Start of Care: 4  Current functional level related to goals / functional outcomes: Pt has met 1 STG, unable to assess remaining goals d/t pt not returning since 04/11/23 OT visit.  Remaining deficits: Pt may have some remaining functional deficits or pain.   Education / Equipment: Pt has some needed materials and education. See tx notes for more details.    Patient goals were partially met. Patient is being discharged due to pt not returning since 04/11/23 OT visit and d/t a change in medical status (ED visits/hospital admissions noted on 4/11, 04/16/23, and 04/22/23).   During OT POC, OT previously discussed with pt and family about likely transition to [facility] therapy services. Pt may benefit from [facility] services once pt discharges home.   Oakley Bellman, OT 05/08/2023, 3:14 PM   [facility] [address] [place], [place], [address] Phone: [phone]   Fax:  [phone]

## 2023-05-09 ENCOUNTER — Encounter: Payer: MEDICAID | Admitting: Occupational Therapy

## 2023-05-11 ENCOUNTER — Encounter: Payer: MEDICAID | Admitting: Occupational Therapy

## 2023-05-17 ENCOUNTER — Encounter (HOSPITAL_COMMUNITY): Payer: Self-pay

## 2023-05-17 ENCOUNTER — Telehealth (HOSPITAL_COMMUNITY): Payer: MEDICAID | Admitting: Physician Assistant

## 2023-11-06 ENCOUNTER — Encounter: Payer: Self-pay | Admitting: Radiology
